# Patient Record
Sex: Female | Born: 1935 | Hispanic: No | Smoking: Former smoker
Health system: Southern US, Community
[De-identification: ages and names within clinical notes are randomized; demographics above are authoritative.]

## PROBLEM LIST (undated history)

## (undated) DIAGNOSIS — N814 Uterovaginal prolapse, unspecified: Secondary | ICD-10-CM

## (undated) DIAGNOSIS — N390 Urinary tract infection, site not specified: Secondary | ICD-10-CM

## (undated) DIAGNOSIS — I10 Essential (primary) hypertension: Secondary | ICD-10-CM

## (undated) DIAGNOSIS — F0390 Unspecified dementia without behavioral disturbance: Secondary | ICD-10-CM

## (undated) DIAGNOSIS — M48061 Spinal stenosis, lumbar region without neurogenic claudication: Secondary | ICD-10-CM

## (undated) DIAGNOSIS — G9340 Encephalopathy, unspecified: Secondary | ICD-10-CM

## (undated) DIAGNOSIS — M119 Crystal arthropathy, unspecified: Secondary | ICD-10-CM

## (undated) HISTORY — PX: NO PAST SURGERIES: SHX2092

---

## 2009-11-23 ENCOUNTER — Ambulatory Visit: Payer: Self-pay | Admitting: Psychology

## 2010-10-13 ENCOUNTER — Emergency Department (HOSPITAL_COMMUNITY)
Admission: EM | Admit: 2010-10-13 | Discharge: 2010-10-13 | Disposition: A | Discharge status: Home / Self Care | Payer: Medicare Other | Attending: Emergency Medicine | Admitting: Emergency Medicine

## 2010-10-13 DIAGNOSIS — R300 Dysuria: Secondary | ICD-10-CM | POA: Insufficient documentation

## 2010-10-13 DIAGNOSIS — M129 Arthropathy, unspecified: Secondary | ICD-10-CM | POA: Insufficient documentation

## 2010-10-13 DIAGNOSIS — N390 Urinary tract infection, site not specified: Secondary | ICD-10-CM | POA: Insufficient documentation

## 2010-10-13 DIAGNOSIS — I10 Essential (primary) hypertension: Secondary | ICD-10-CM | POA: Insufficient documentation

## 2010-10-13 DIAGNOSIS — E119 Type 2 diabetes mellitus without complications: Secondary | ICD-10-CM | POA: Insufficient documentation

## 2010-10-13 LAB — URINALYSIS, ROUTINE W REFLEX MICROSCOPIC
Bilirubin Urine: NEGATIVE
Glucose, UA: >1000 mg/dL — AB
Ketones, ur: 15 mg/dL — AB
Nitrite: POSITIVE — AB
Protein, ur: NEGATIVE mg/dL
Specific Gravity, Urine: 1.015 (ref 1.005–1.030)
Urobilinogen, UA: 0.2 mg/dL (ref 0.0–1.0)
pH: 6.5 (ref 5.0–8.0)

## 2010-10-13 LAB — URINE MICROSCOPIC-ADD ON

## 2010-10-13 LAB — CBC
Hemoglobin: 13.1 g/dL (ref 12.0–15.0)
MCHC: 33 g/dL (ref 30.0–36.0)
RDW: 13.3 % (ref 11.5–15.5)
WBC: 6.4 10*3/uL (ref 4.0–10.5)

## 2010-10-13 LAB — DIFFERENTIAL
Basophils Absolute: 0 10*3/uL (ref 0.0–0.1)
Basophils Relative: 0 % (ref 0–1)
Eosinophils Relative: 0 % (ref 0–5)
Monocytes Absolute: 0.6 10*3/uL (ref 0.1–1.0)
Neutro Abs: 5 10*3/uL (ref 1.7–7.7)

## 2010-10-13 LAB — BASIC METABOLIC PANEL
GFR calc Af Amer: >60 mL/min (ref >60)
GFR calc non Af Amer: >60 mL/min (ref >60)
Potassium: 3.3 mEq/L — ABNORMAL LOW (ref 3.5–5.1)
Sodium: 144 mEq/L (ref 135–145)

## 2010-10-15 LAB — URINE CULTURE: Culture  Setup Time: 201208300157

## 2010-11-03 ENCOUNTER — Emergency Department (HOSPITAL_COMMUNITY)
Admission: EM | Admit: 2010-11-03 | Discharge: 2010-11-03 | Disposition: A | Discharge status: Home / Self Care | Payer: Medicare Other | Attending: Emergency Medicine | Admitting: Emergency Medicine

## 2010-11-03 DIAGNOSIS — N814 Uterovaginal prolapse, unspecified: Secondary | ICD-10-CM | POA: Insufficient documentation

## 2010-11-03 DIAGNOSIS — N390 Urinary tract infection, site not specified: Secondary | ICD-10-CM | POA: Insufficient documentation

## 2010-11-03 DIAGNOSIS — I10 Essential (primary) hypertension: Secondary | ICD-10-CM | POA: Insufficient documentation

## 2010-11-03 DIAGNOSIS — E119 Type 2 diabetes mellitus without complications: Secondary | ICD-10-CM | POA: Insufficient documentation

## 2010-11-08 ENCOUNTER — Observation Stay (HOSPITAL_COMMUNITY)
Admission: AD | Admit: 2010-11-08 | Discharge: 2010-11-08 | Disposition: A | Discharge status: Home / Self Care | Payer: Medicare Other | Source: Ambulatory Visit | Attending: Obstetrics and Gynecology | Admitting: Obstetrics and Gynecology

## 2010-11-08 DIAGNOSIS — F0390 Unspecified dementia without behavioral disturbance: Secondary | ICD-10-CM | POA: Insufficient documentation

## 2010-11-08 DIAGNOSIS — N390 Urinary tract infection, site not specified: Secondary | ICD-10-CM | POA: Diagnosis present

## 2010-11-08 DIAGNOSIS — B9620 Unspecified Escherichia coli [E. coli] as the cause of diseases classified elsewhere: Secondary | ICD-10-CM | POA: Diagnosis present

## 2010-11-08 DIAGNOSIS — N814 Uterovaginal prolapse, unspecified: Secondary | ICD-10-CM | POA: Diagnosis present

## 2010-11-08 DIAGNOSIS — N812 Incomplete uterovaginal prolapse: Principal | ICD-10-CM | POA: Insufficient documentation

## 2010-11-08 NOTE — ED Provider Notes (Signed)
S:  [name] is an 77y.o. Black female who presents with CC of prolapsed uterus.  Her son had been in contact with office several times today and appointment there attempted to be arranged, but her son was unable to get her there before closed.  Pt with dementia, so poor historian and son uncomfortable r/e this aspect of his mother's care; when tried to ask about her history he stated, "Don't you have her records from the office."  Her son reports [name] does indeed live with him, his wife, and their two kids.  She has been having difficulty with the uterine prolapse for about the last 3 days, but has had complicated UTI which has been ongoing for about 3 weeks.  Pt also c/o lower abdominal pain for about 1.5 weeks, and noted light vaginal bleeding on "Depends" pad today.  Significant pain reported with Bowel movements.  Pt seen as new patient at [facility] last week by Dr. Su Hilt and dx'd with prolapse, as well as cystocele and rectocele.  Pessary has been ordered, but has not yet arrived.  Son reports his mother in general cares for herself, with some assistance, but since UTI, has needed more care and attention and "hasn't been herself."  He was out-of-town and she stayed with a cousin in [place] and was taken to hospital r/e UTI.  Son does not know her medications.  Did get brief PMH from her son.  BM record unable to be obtained from son.  Pt usually mobile with assistance of a cane, however, since UTI arisen, has been in [facility] more.    PCP:  Dr. Parke Simmers NKDA  Meds: from office sheet: insulin, benadryl, isosorbide, verapamil, ASA, Cilostazol, sulfamethoxazole, Orphenadrine     Tylenol as needed Brief PMH:  Insulin-dependent diabetes, HTN, high cholesterol, dementia FM HX:  Noncontributory   O:  VS:  157/89, 88, 97.2, 16 PE:  Gen:  NAD, alert; did not ask orientation question; pt cooperative, but speaks minimally.  Appropriate dress; glasses.  Well-nourished. Pelvic:  RN's assisted pt from [facility] to  bed, and son left room; asked if he would be comfortable learning how to replace uterus, and he did not.  As removed fish-net hospital underwear, complete uterine prolapse noted, and most distal surface scabby and excoriated and can see where has bled; very superficial old dried blood on pad.  Explained procedure to pt, and with large amount of lubrication, reinserted uterus as far as pt able to tolerate.  Pt did proclaim with procedure "that's hurting," but as soon as removed hand from vagina, pt calm and without complaints.  Helped pt with undergarments again and assisted to [facility] and pt helped to son's car.  A:  1.  Uterine prolapse       2.  Dementia       3.  77y.o. Black female with several co-morbidities       4.  Uterine irritation causing bleeding from prolapse, friction       5.  Current UTI treatment       6.  ADL's with assistance; mobility decreased with recent infection  P:  1.  Pt d/c'd home after procedure; son given several clean gloves and packets of lubrication and offered to teach his wife over the phone on how to reinsert when happens again while awaiting pessary arrival--he is unsure if she will feel comfortable doing so.      2.  Office to call when pessary arrives, or pt to follow-up  prn      3.  Continue f/u with dr. Parke Simmers r/e other treatments for co-morbidities       4. Continue medications as previously prescribed

## 2010-11-08 NOTE — Progress Notes (Signed)
H STEELMAN CNM AT BEDSIDE  

## 2010-11-08 NOTE — Progress Notes (Signed)
Pt states, " My uterus came out about three days ago. I have some pain in my low abdomen for 1 1/2 weeks."

## 2010-11-13 ENCOUNTER — Other Ambulatory Visit: Payer: Self-pay | Admitting: Obstetrics and Gynecology

## 2010-11-13 ENCOUNTER — Inpatient Hospital Stay (HOSPITAL_COMMUNITY)
Admission: AD | Admit: 2010-11-13 | Discharge: 2010-11-13 | Disposition: A | Discharge status: Home / Self Care | Payer: Medicare Other | Source: Ambulatory Visit | Attending: Obstetrics and Gynecology | Admitting: Obstetrics and Gynecology

## 2010-11-13 DIAGNOSIS — R339 Retention of urine, unspecified: Secondary | ICD-10-CM | POA: Insufficient documentation

## 2010-11-13 LAB — URINALYSIS, ROUTINE W REFLEX MICROSCOPIC
Glucose, UA: 500 mg/dL — AB
Protein, ur: NEGATIVE mg/dL
Specific Gravity, Urine: 1.015 (ref 1.005–1.030)
pH: 5.5 (ref 5.0–8.0)

## 2010-11-13 LAB — URINE MICROSCOPIC-ADD ON

## 2010-11-13 MED ORDER — CIPROFLOXACIN HCL 500 MG PO TABS: 500.0000 mg | ORAL_TABLET | Freq: Two times a day (BID) | ORAL | Status: AC

## 2010-11-13 NOTE — ED Provider Notes (Signed)
Subjective: Patient reports that she is having no problems or pain.  Her son brought her in for eval because her pessary fell out and she has been going to the bathroom every 15 minutes.    Objective: I have reviewed patient's medications.  General: alert and cooperative CV RRR Lungs CTA B ABD nd soft , nt GU total procedentia.  Some errosion at the cervix no bleeding.  Vagina is atrophic.  No vulvar masses seen EXT no calf tenderness B In and out cath done to send UA and pt had 600cc of urine in the bladder  Assessment/Plan: Total Procedentia Urinary retention Check vital signs and UA Pessary cleaned and [place] used for lubrication and pessary replaced Follow up with Dr Su Hilt to discuss long term plan   LOS: 0 days    Kristi Snow A 11/13/2010, 2:29 PM

## 2010-11-13 NOTE — ED Provider Notes (Signed)
Ms. Sida is an 77 yo patient presenting with her son, who reports patient's pessary had fallen out yesterday.  Subsequently, patient has had urinary frequency.  Was recently fitted by Dr. Su Hilt for a pessary due to a complete uterine prolapse--fitting was on Thursday, then fell out on Friday.    Hx remarkable for: Dementia HTN Diabetes, insulin dependent Limited mobility Recent complicated UTI, has completed ATB course early this week.  No current medications.  Medical patient of Dr. Parke Simmers.  Meds: from office sheet: insulin, benadryl, isosorbide, verapamil, ASA, Cilostazol, sulfamethoxazole, Orphenadrine  Tylenol as needed   FM HX: Noncontributory  Filed Vitals:   11/13/10 1429  BP: 150/64  Pulse: 67  Temp: 97.6 F (36.4 C)  Resp: 20   Physican Exam: In NAD.  Does not remember pessary being placed, or any other history or medications. Unable to void on arrival. VSS. Afebrile Complete uterine prolapse noted per visual exam. No active bleeding at present.  UA:  SG 1.015, 500 glucose, 11-20 WBCs Sent to culture  Dr. Normand Sloop in to see patient--also talked with patient's son after examination of patient.  Pessary replaced without difficulty I&O cath done for specimen, and bladder drained of approx 600 cc clear urine.   Assessment/Plan: Complete uterine prolapse, with replacement of pessary Possible persistent UTI--awaiting culture results Per consult with Dr. Normand Sloop, will give RX for Cipro 500 mg po BID x 3 days.  Will suggest initiation be deferred till culture resulted. See Dr. Redmond Baseman note for further information. D/C'd home with son.  Nigel Bridgeman, CNM 11/13/10 1525

## 2010-11-14 LAB — URINE CULTURE
Colony Count: 8000
Culture  Setup Time: 201209291927

## 2011-03-07 ENCOUNTER — Encounter (HOSPITAL_COMMUNITY): Payer: Self-pay | Admitting: *Deleted

## 2011-03-07 ENCOUNTER — Emergency Department (HOSPITAL_COMMUNITY): Payer: Medicare Other

## 2011-03-07 ENCOUNTER — Other Ambulatory Visit: Payer: Self-pay

## 2011-03-07 ENCOUNTER — Inpatient Hospital Stay (HOSPITAL_COMMUNITY)
Admission: EM | Admit: 2011-03-07 | Discharge: 2011-03-10 | DRG: 551 | Disposition: A | Discharge status: Skilled Nursing Facility | Payer: Medicare Other | Attending: Internal Medicine | Admitting: Internal Medicine

## 2011-03-07 DIAGNOSIS — Z79899 Other long term (current) drug therapy: Secondary | ICD-10-CM

## 2011-03-07 DIAGNOSIS — M549 Dorsalgia, unspecified: Secondary | ICD-10-CM | POA: Diagnosis present

## 2011-03-07 DIAGNOSIS — M1990 Unspecified osteoarthritis, unspecified site: Secondary | ICD-10-CM | POA: Diagnosis present

## 2011-03-07 DIAGNOSIS — Z23 Encounter for immunization: Secondary | ICD-10-CM

## 2011-03-07 DIAGNOSIS — F0390 Unspecified dementia without behavioral disturbance: Secondary | ICD-10-CM | POA: Diagnosis present

## 2011-03-07 DIAGNOSIS — M545 Low back pain, unspecified: Secondary | ICD-10-CM | POA: Diagnosis present

## 2011-03-07 DIAGNOSIS — M48061 Spinal stenosis, lumbar region without neurogenic claudication: Secondary | ICD-10-CM | POA: Diagnosis present

## 2011-03-07 DIAGNOSIS — E876 Hypokalemia: Secondary | ICD-10-CM | POA: Diagnosis not present

## 2011-03-07 DIAGNOSIS — N814 Uterovaginal prolapse, unspecified: Secondary | ICD-10-CM | POA: Diagnosis present

## 2011-03-07 DIAGNOSIS — G9349 Other encephalopathy: Secondary | ICD-10-CM | POA: Diagnosis present

## 2011-03-07 DIAGNOSIS — E119 Type 2 diabetes mellitus without complications: Secondary | ICD-10-CM | POA: Diagnosis present

## 2011-03-07 DIAGNOSIS — A498 Other bacterial infections of unspecified site: Secondary | ICD-10-CM | POA: Diagnosis present

## 2011-03-07 DIAGNOSIS — N390 Urinary tract infection, site not specified: Secondary | ICD-10-CM | POA: Diagnosis present

## 2011-03-07 DIAGNOSIS — B9620 Unspecified Escherichia coli [E. coli] as the cause of diseases classified elsewhere: Secondary | ICD-10-CM | POA: Diagnosis present

## 2011-03-07 DIAGNOSIS — G8929 Other chronic pain: Secondary | ICD-10-CM | POA: Diagnosis present

## 2011-03-07 DIAGNOSIS — R5381 Other malaise: Secondary | ICD-10-CM | POA: Diagnosis present

## 2011-03-07 DIAGNOSIS — M4120 Other idiopathic scoliosis, site unspecified: Secondary | ICD-10-CM | POA: Diagnosis present

## 2011-03-07 DIAGNOSIS — Z794 Long term (current) use of insulin: Secondary | ICD-10-CM

## 2011-03-07 DIAGNOSIS — Z6825 Body mass index (BMI) 25.0-25.9, adult: Secondary | ICD-10-CM

## 2011-03-07 DIAGNOSIS — I10 Essential (primary) hypertension: Secondary | ICD-10-CM | POA: Diagnosis present

## 2011-03-07 DIAGNOSIS — M47817 Spondylosis without myelopathy or radiculopathy, lumbosacral region: Principal | ICD-10-CM | POA: Diagnosis present

## 2011-03-07 HISTORY — DX: Essential (primary) hypertension: I10

## 2011-03-07 LAB — URINE MICROSCOPIC-ADD ON

## 2011-03-07 LAB — URINALYSIS, ROUTINE W REFLEX MICROSCOPIC
Glucose, UA: 250 mg/dL — AB
Ketones, ur: NEGATIVE mg/dL
pH: 6 (ref 5.0–8.0)

## 2011-03-07 LAB — BASIC METABOLIC PANEL
CO2: 21 mEq/L (ref 19–32)
Chloride: 106 mEq/L (ref 96–112)
Creatinine, Ser: 0.69 mg/dL (ref 0.50–1.10)

## 2011-03-07 LAB — URINE CULTURE

## 2011-03-07 LAB — GLUCOSE, CAPILLARY
Glucose-Capillary: 203 mg/dL — ABNORMAL HIGH (ref 70–99)
Glucose-Capillary: 224 mg/dL — ABNORMAL HIGH (ref 70–99)

## 2011-03-07 MED ORDER — SODIUM CHLORIDE 0.9 % IV SOLN
INTRAVENOUS | Status: DC
  Administered 2011-03-07: 75 mL/h via INTRAVENOUS
  Administered 2011-03-08 – 2011-03-09 (×2): via INTRAVENOUS

## 2011-03-07 MED ORDER — DONEPEZIL HCL 5 MG PO TABS
5.0000 mg | ORAL_TABLET | Freq: Every day | ORAL | Status: DC
  Administered 2011-03-07 – 2011-03-09 (×3): 5 mg via ORAL
  Filled 2011-03-07 (×4): qty 1

## 2011-03-07 MED ORDER — OXYCODONE HCL 5 MG PO TABS
5.0000 mg | ORAL_TABLET | ORAL | Status: DC | PRN
  Administered 2011-03-08: 5 mg via ORAL
  Filled 2011-03-07: qty 1

## 2011-03-07 MED ORDER — HYDROCODONE-ACETAMINOPHEN 5-325 MG PO TABS
1.0000 | ORAL_TABLET | Freq: Once | ORAL | Status: AC
  Administered 2011-03-07: 1 via ORAL
  Filled 2011-03-07: qty 1

## 2011-03-07 MED ORDER — POTASSIUM CHLORIDE CRYS ER 20 MEQ PO TBCR
20.0000 meq | EXTENDED_RELEASE_TABLET | Freq: Every day | ORAL | Status: DC
  Administered 2011-03-08 – 2011-03-10 (×3): 20 meq via ORAL
  Filled 2011-03-07 (×3): qty 1

## 2011-03-07 MED ORDER — ISOSORBIDE MONONITRATE 20 MG PO TABS
20.0000 mg | ORAL_TABLET | Freq: Two times a day (BID) | ORAL | Status: DC
  Administered 2011-03-08 – 2011-03-10 (×4): 20 mg via ORAL
  Filled 2011-03-07 (×6): qty 1

## 2011-03-07 MED ORDER — DIPHENHYDRAMINE HCL 50 MG PO CAPS
100.0000 mg | ORAL_CAPSULE | Freq: Every day | ORAL | Status: DC
  Administered 2011-03-07 – 2011-03-09 (×3): 100 mg via ORAL
  Filled 2011-03-07 (×4): qty 2

## 2011-03-07 MED ORDER — DEXTROSE 5 % IV SOLN
1.0000 g | INTRAVENOUS | Status: DC
  Administered 2011-03-07 – 2011-03-09 (×3): 1 g via INTRAVENOUS
  Filled 2011-03-07 (×4): qty 10

## 2011-03-07 MED ORDER — INSULIN ASPART 100 UNIT/ML ~~LOC~~ SOLN
0.0000 [IU] | Freq: Three times a day (TID) | SUBCUTANEOUS | Status: DC
  Administered 2011-03-08 – 2011-03-09 (×4): 3 [IU] via SUBCUTANEOUS
  Administered 2011-03-10: 5 [IU] via SUBCUTANEOUS
  Filled 2011-03-07: qty 3

## 2011-03-07 MED ORDER — INSULIN NPH (HUMAN) (ISOPHANE) 100 UNIT/ML ~~LOC~~ SUSP
20.0000 [IU] | Freq: Every day | SUBCUTANEOUS | Status: DC
  Administered 2011-03-08: 20 [IU] via SUBCUTANEOUS
  Filled 2011-03-07 (×2): qty 10

## 2011-03-07 MED ORDER — VERAPAMIL HCL ER 240 MG PO TBCR
240.0000 mg | EXTENDED_RELEASE_TABLET | Freq: Every day | ORAL | Status: DC
Start: 2011-03-07 — End: 2011-03-10
  Administered 2011-03-07 – 2011-03-09 (×3): 240 mg via ORAL
  Filled 2011-03-07 (×4): qty 1

## 2011-03-07 MED ORDER — CILOSTAZOL 100 MG PO TABS
100.0000 mg | ORAL_TABLET | Freq: Two times a day (BID) | ORAL | Status: DC
  Administered 2011-03-07 – 2011-03-10 (×6): 100 mg via ORAL
  Filled 2011-03-07 (×7): qty 1

## 2011-03-07 MED ORDER — ACETAMINOPHEN 325 MG PO TABS
650.0000 mg | ORAL_TABLET | Freq: Every day | ORAL | Status: DC
  Administered 2011-03-08 – 2011-03-10 (×3): 650 mg via ORAL
  Filled 2011-03-07 (×3): qty 2

## 2011-03-07 NOTE — ED Notes (Signed)
Ambulation trial for patient, patient able to get out of bed and take a few steps with assistance x 2.  Patient still with limited ROM in right leg.  Patient placed back in bed with no difficulties.  Family present during ambulatory trial.  Patient's progress reported to MD.

## 2011-03-07 NOTE — ED Notes (Signed)
Patient ordered for ambulation.  With assistance x 2, patient unable to ambulate, patient weak and unable to stand.  MD notified.  Patient placed back in bed.

## 2011-03-07 NOTE — ED Notes (Signed)
MD at bedside at this time.

## 2011-03-07 NOTE — ED Notes (Signed)
Patient c/o lower back pain radiating into right leg.  Patient states this is chronic pain issue resulting from back injury x 20 years ago.  Patient states this morning back with increase in pain. Patient reports no known new injury to back.

## 2011-03-07 NOTE — ED Provider Notes (Cosign Needed)
History     CSN: [id number]  Arrival date & time 03/07/11  0911   First MD Initiated Contact with Patient 03/07/11 0920      Chief Complaint  Patient presents with  . Back Pain    lower back and legs    (Consider location/radiation/quality/duration/timing/severity/associated sxs/prior treatment) HPI Comments: Patient is an elderly lady, 78 years old, living with her son. Today she complained of severe back pain that radiates into the right leg. He hasn't had any recent problem with that. Apparently she had a back injury some 20 years ago. There is no new injury.  Patient is a 78 y.o. female presenting with back pain. The history is provided by the patient and a relative. No language interpreter was used.  Back Pain  This is a recurrent problem. Episode onset: Noted today. The problem occurs constantly. The problem has not changed since onset.Associated with: No recent injury. The pain is present in the lumbar spine. The quality of the pain is described as aching. The pain radiates to the right thigh. The pain is at a severity of 6/10. The symptoms are aggravated by bending and twisting. She has tried nothing for the symptoms. Risk factors include lack of exercise, a sedentary lifestyle and menopause.    Past Medical History  Diagnosis Date  . Hypertension   . Diabetes mellitus   . Back injury     History reviewed. No pertinent past surgical history.  History reviewed. No pertinent family history.  History  Substance Use Topics  . Smoking status: Never Smoker   . Smokeless tobacco: Not on file  . Alcohol Use: No    OB History    Grav Para Term Preterm Abortions TAB SAB Ect Mult Living                  Review of Systems  Constitutional: Negative.   HENT: Negative.   Eyes: Negative.   Respiratory: Negative.   Cardiovascular: Negative.   Gastrointestinal: Negative.   Genitourinary: Negative.   Musculoskeletal: Positive for back pain.  Skin: Negative.     Neurological: Negative.   Psychiatric/Behavioral: Negative.     Allergies  Review of patient's allergies indicates no known allergies.  Home Medications   Current Outpatient Rx  Name Route Sig Dispense Refill  . CILOSTAZOL 100 MG PO TABS Oral Take 100 mg by mouth 2 (two) times daily.      . DONEPEZIL HCL 5 MG PO TABS Oral Take 5 mg by mouth at bedtime as needed.      . INSULIN ISOPHANE HUMAN 100 UNIT/ML [employer] SUSP Subcutaneous Inject 20 Units into the skin daily before breakfast.      . ISOSORBIDE MONONITRATE 20 MG PO TABS Oral Take 20 mg by mouth 2 (two) times daily. Space doses by at least (7) hours.     . ORPHENADRINE CITRATE 100 MG PO TB12 Oral Take 100 mg by mouth 2 (two) times daily.      Marland Kitchen POTASSIUM CHLORIDE CRYS ER 20 MEQ PO TBCR Oral Take 20 mEq by mouth daily.      . TRIAMTERENE-HCTZ 37.5-25 MG PO TABS Oral Take 1 tablet by mouth daily.      Marland Kitchen VERAPAMIL HCL ER 240 MG PO TBCR Oral Take 240 mg by mouth at bedtime.        BP 147/74  Pulse 85  Temp(Src) 97.7 F (36.5 C) (Oral)  Resp 18  SpO2 96%  Physical Exam  Nursing note and vitals reviewed.  Constitutional: She is oriented to person, place, and time. She appears well-developed and well-nourished. Distressed: in moderate distress with back pain, made worse when she changes position.  HENT:  Head: Normocephalic and atraumatic.  Right Ear: External ear normal.  Left Ear: External ear normal.  Mouth/Throat: Oropharynx is clear and moist.  Eyes: Conjunctivae and EOM are normal. Pupils are equal, round, and reactive to light.  Neck: Normal range of motion. Neck supple.  Cardiovascular: Normal rate, regular rhythm and normal heart sounds.   Pulmonary/Chest: Effort normal and breath sounds normal.  Abdominal: Soft. Bowel sounds are normal.  Musculoskeletal:       She localizes pain to the lower lumbar region. There is no palpable deformity there she has mild tenderness in the area of L5-S1.  Neurological: She is alert and  oriented to person, place, and time.       No sensory or motor deficit.  Skin: Skin is warm and dry.  Psychiatric: She has a normal mood and affect. Her behavior is normal.    ED Course  Procedures (including critical care time)  9:34 AM Seen and had physical examination. Hydrocodone acetaminophen ordered for pain. MRI of the lumbar spine ordered.  11:18 AM Pt is in MRI.  2:04 PM Pt's MRI showed severe spinal stenosis and degenerative changes with foraminal encroachment.  I advised her and her son of this.  We attempted to have her walk, but she could not walk, with pain in the right leg.  Will do admitting lab and request admission for pain control, consideration of neurosurgery consultation.  2:38 PM  Date: 03/07/2011  Rate: 82  Rhythm: normal sinus rhythm  QRS Axis: normal  Intervals: normal QRS: low QRS voltage in limb leads.  ST/T Wave abnormalities: normal  Conduction Disutrbances:none  Narrative Interpretation: Essentially normal EKG.  Old EKG Reviewed: none available   1. Low back pain           Carleene Cooper III, MD 03/07/11 1515

## 2011-03-08 LAB — BASIC METABOLIC PANEL
BUN: 10 mg/dL (ref 6–23)
Calcium: 8.7 mg/dL (ref 8.4–10.5)
Creatinine, Ser: 0.71 mg/dL (ref 0.50–1.10)
GFR calc Af Amer: 89 mL/min — ABNORMAL LOW (ref >90)
GFR calc non Af Amer: 77 mL/min — ABNORMAL LOW (ref >90)

## 2011-03-08 LAB — CBC
HCT: 35.4 % — ABNORMAL LOW (ref 36.0–46.0)
MCHC: 32.5 g/dL (ref 30.0–36.0)
Platelets: 222 10*3/uL (ref 150–400)
RDW: 14.4 % (ref 11.5–15.5)
WBC: 3.3 10*3/uL — ABNORMAL LOW (ref 4.0–10.5)

## 2011-03-08 LAB — GLUCOSE, CAPILLARY
Glucose-Capillary: 182 mg/dL — ABNORMAL HIGH (ref 70–99)
Glucose-Capillary: 186 mg/dL — ABNORMAL HIGH (ref 70–99)

## 2011-03-08 LAB — HEMOGLOBIN A1C: Hgb A1c MFr Bld: 8.7 % — ABNORMAL HIGH (ref <5.7)

## 2011-03-08 MED ORDER — LORAZEPAM 0.5 MG PO TABS
0.5000 mg | ORAL_TABLET | Freq: Four times a day (QID) | ORAL | Status: DC | PRN
  Administered 2011-03-08: 0.5 mg via ORAL
  Filled 2011-03-08: qty 1

## 2011-03-08 MED ORDER — INSULIN NPH (HUMAN) (ISOPHANE) 100 UNIT/ML ~~LOC~~ SUSP: 20.0000 [IU] | Freq: Two times a day (BID) | SUBCUTANEOUS | Status: DC

## 2011-03-08 MED ORDER — LORAZEPAM 2 MG/ML IJ SOLN
0.5000 mg | Freq: Once | INTRAMUSCULAR | Status: AC
  Administered 2011-03-08: 0.5 mg via INTRAVENOUS

## 2011-03-08 MED ORDER — LORAZEPAM 2 MG/ML IJ SOLN
INTRAMUSCULAR | Status: AC
  Administered 2011-03-08: 0.5 mg
  Filled 2011-03-08: qty 1

## 2011-03-08 MED ORDER — LORAZEPAM 2 MG/ML IJ SOLN
0.5000 mg | Freq: Four times a day (QID) | INTRAMUSCULAR | Status: DC | PRN
Start: 2011-03-08 — End: 2011-03-10
  Administered 2011-03-09 (×2): 0.5 mg via INTRAVENOUS
  Filled 2011-03-08 (×2): qty 1

## 2011-03-08 MED ORDER — PNEUMOCOCCAL VAC POLYVALENT 25 MCG/0.5ML IJ INJ
0.5000 mL | INJECTION | INTRAMUSCULAR | Status: AC
  Administered 2011-03-09: 0.5 mL via INTRAMUSCULAR
  Filled 2011-03-08: qty 0.5

## 2011-03-08 MED ORDER — INSULIN NPH (HUMAN) (ISOPHANE) 100 UNIT/ML ~~LOC~~ SUSP
10.0000 [IU] | Freq: Every day | SUBCUTANEOUS | Status: DC
  Administered 2011-03-08: 10 [IU] via SUBCUTANEOUS

## 2011-03-08 MED ORDER — INFLUENZA VIRUS VACC SPLIT PF IM SUSP
0.5000 mL | INTRAMUSCULAR | Status: AC
  Administered 2011-03-09: 0.5 mL via INTRAMUSCULAR
  Filled 2011-03-08: qty 0.5

## 2011-03-08 MED ORDER — INSULIN NPH (HUMAN) (ISOPHANE) 100 UNIT/ML ~~LOC~~ SUSP
20.0000 [IU] | Freq: Every day | SUBCUTANEOUS | Status: DC
  Administered 2011-03-09 – 2011-03-10 (×2): 20 [IU] via SUBCUTANEOUS

## 2011-03-08 NOTE — Evaluation (Signed)
Physical Therapy Evaluation Patient Details Name: Kristi Snow MRN: [id number] DOB: Dec 31, 1924 Today's Date: 03/08/2011  Problem List: referred to PT for difficulty walking and unsteady gait Patient Active Problem List  Diagnoses  . Prolapsed uterus  . UTI (lower urinary tract infection)  . Back pain  . Dementia    Past Medical History:  Past Medical History  Diagnosis Date  . Hypertension   . Diabetes mellitus   . Back injury    Past Surgical History: History reviewed. No pertinent past surgical history.  PT Assessment/Plan/Recommendation PT Assessment Clinical Impression Statement: Pt total A for only transfer OOB today and unable to follow commands.  Per nurse, pt was more mobile in evening and agitated, was given ativan and question if this had effect on cognition and mobility during eval today.  Unsure of functional or cognitive baseline as no family present during eval, goals may be revised after this information is obtained. PT Recommendation/Assessment: Patient will need skilled PT in the acute care venue PT Problem List: Decreased activity tolerance;Decreased balance;Decreased mobility;Decreased safety awareness;Decreased cognition Barriers to Discharge Comments: need to detrmine support available PT Therapy Diagnosis : Difficulty walking;Generalized weakness PT Plan PT Frequency: Min 2X/week PT Treatment/Interventions: DME instruction;Gait training;Functional mobility training;Stair training;Therapeutic activities;Therapeutic exercise;Balance training PT Recommendation Follow Up Recommendations: Skilled nursing facility PT Goals  Acute Rehab PT Goals PT Goal Formulation: Patient unable to participate in goal setting Time For Goal Achievement: 2 weeks Pt will go Supine/Side to Sit: with min assist PT Goal: Supine/Side to Sit - Progress: Goal set today Pt will go Sit to Supine/Side: with min assist PT Goal: Sit to Supine/Side - Progress: Goal set today Pt will go Sit  to Stand: with min assist PT Goal: Sit to Stand - Progress: Goal set today Pt will go Stand to Sit: with min assist PT Goal: Stand to Sit - Progress: Goal set today Pt will Stand: with min assist;1 - 2 min;with unilateral upper extremity support PT Goal: Stand - Progress: Goal set today Pt will Ambulate: 16 - 50 feet;with min assist;with least restrictive assistive device;with cues (comment type and amount) PT Goal: Ambulate - Progress: Goal set today  PT Evaluation Precautions/Restrictions  Precautions Precautions: Fall Precaution Comments: dementia, nurse reports combative last night prior to Ativan Prior Functioning  Home Living Lives With: Son Additional Comments: no family present, pt unable to give information   Cognition Cognition Arousal/Alertness:  (closing eyes frequently when in bed) Overall Cognitive Status: History of cognitive impairments History of Cognitive Impairment: Decline in baseline functioning Orientation Level: Disoriented X4 Cognition - Other Comments: pt followed no commands, question if this is baseline or resulting from  Ativan that had been given Sensation/Coordination Sensation Light Touch: Not tested Extremity Assessment RLE Assessment RLE Assessment:  (unable to assess d/t cognition, full P/ROM) LLE Assessment LLE Assessment:  (unable to assess d/t cognition, full P/ROM) Mobility (including Balance) Bed Mobility Bed Mobility: Yes Supine to Sit: 2: Max assist Supine to Sit Details (indicate cue type and reason): initially resisitive Sitting - Scoot to [place] of Bed: 2: Max assist Transfers Transfers: Yes Sit to Stand: 1: +1 Total assist;From bed Sit to Stand Details (indicate cue type and reason): unable to stand upright Stand to Sit: To bed;1: +1 Total assist Squat Pivot Transfers: 1: +1 Total assist (pt = 20%, did weight bear thru LE's) Ambulation/Gait Ambulation/Gait: No Stairs: No  Balance Balance Assessed: Yes Static Sitting  Balance Static Sitting - Level of Assistance: 3: Mod assist (progressed to  close S) Static Standing Balance Static Standing - Level of Assistance: 1: +1 Total assist (unable to get fully upright)   End of Session PT - End of Session Equipment Utilized During Treatment: Gait belt Activity Tolerance:  (question medication effects on mobility today) Nurse Communication: Mobility status for transfers (pt unable to feed self, unable to be left alone in chair) General Behavior During Session: Flat affect Cognition: Impaired  Michaelene Song 03/08/2011, 11:54 AM

## 2011-03-08 NOTE — H&P (Signed)
PCP: Dr. Andi Devon   Chief Complaint: Back pain   HPI: Kristi Snow is an 78 y.o. female with history of dementia, hypertension, diabetes, prior back injury, presents to the emergency room complaining of back pain. She has history of dementia and her chief complaint was rather unreliable. Her son states that she has trouble with walking, and make facial grimaces when she turned on her bed. Evaluation in the emergency room including MRI of her LS-spine showing degenerative joint disease, multilevel facet joint stenosis, lateral recess stenosis, but no fracture. Her urinalysis is positive for urinary tract infection. She was given pain medication, felt a bit better, but still have unsteadiness in her gait. Because of the UTI, the low back pain, and unsteady gait, hospitalist was asked to admit her for further treatment.  Rewiew of Systems: Her dementia is moderate, her review of systems is therefore unreliable.    Past Medical History  Diagnosis Date  . Hypertension   . Diabetes mellitus   . Back injury     History reviewed. No pertinent past surgical history.  Medications:  HOME MEDS: Prior to Admission medications   Medication Sig Start Date End Date Taking? Authorizing Provider  acetaminophen (TYLENOL) 325 MG tablet Take 650 mg by mouth daily.   Yes Historical Provider, MD  cilostazol (PLETAL) 100 MG tablet Take 100 mg by mouth 2 (two) times daily.     Yes Historical Provider, MD  diphenhydrAMINE (BENADRYL) 50 MG capsule Take 100 mg by mouth at bedtime.   Yes Historical Provider, MD  donepezil (ARICEPT) 5 MG tablet Take 5 mg by mouth daily.    Yes Historical Provider, MD  insulin NPH (HUMULIN N,NOVOLIN N) 100 UNIT/ML injection Inject 20 Units into the skin daily before breakfast.     Yes Historical Provider, MD  isosorbide mononitrate (ISMO,MONOKET) 20 MG tablet Take 20 mg by mouth 2 (two) times daily. Space doses by at least (7) hours.    Yes Historical Provider, MD    orphenadrine (NORFLEX) 100 MG tablet Take 100 mg by mouth 2 (two) times daily. With tylenol   Yes Historical Provider, MD  potassium chloride SA (K-DUR,KLOR-CON) 20 MEQ tablet Take 20 mEq by mouth daily.     Yes Historical Provider, MD  triamterene-hydrochlorothiazide (MAXZIDE-25) 37.5-25 MG per tablet Take 1 tablet by mouth daily.     Yes Historical Provider, MD  verapamil (CALAN-SR) 240 MG CR tablet Take 240 mg by mouth at bedtime.     Yes Historical Provider, MD     Allergies:  No Known Allergies  Social History:   reports that she has never smoked. She does not have any smokeless tobacco history on file. She reports that she does not drink alcohol or use illicit drugs.  Family History: History reviewed. No pertinent family history.   Physical Exam: Filed Vitals:   03/07/11 0912 03/07/11 1321 03/07/11 1840 03/07/11 2100  BP: 147/74 158/79 151/76 156/69  Pulse: 85 79 82 76  Temp: 97.7 F (36.5 C) 96.6 F (35.9 C)  97 F (36.1 C)  TempSrc: Oral Oral  Oral  Resp: 18 18 18 20   SpO2: 96% 96% 98% 96%   Blood pressure 156/69, pulse 76, temperature 97 F (36.1 C), temperature source Oral, resp. rate 20, SpO2 96.00%.  GEN:  Pleasant person lying in the stretcher in no acute distress; cooperative with exam. She confabulates quite a bit PSYCH: She does not know the president's name, the year, the current place. She does not appear  anxious does not appear depressed; affect is normal HEENT: Mucous membranes pink and anicteric; PERRLA; EOM intact; no cervical lymphadenopathy nor thyromegaly or carotid bruit; no JVD; Breasts:: Not examined CHEST WALL: No tenderness CHEST: Normal respiration, clear to auscultation bilaterally HEART: Regular rate and rhythm; no murmurs rubs or gallops BACK:  no CVA tenderness ABDOMEN: Obese, soft non-tender; no masses, no organomegaly, normal abdominal bowel sounds; no pannus; no intertriginous candida. Rectal Exam: Not done EXTREMITIES: No bone or  joint deformity; age-appropriate arthropathy of the hands and knees; no edema; no ulcerations. Genitalia: not examined PULSES: 2+ and symmetric SKIN: Normal hydration no rash or ulceration CNS: Cranial nerves 2-12 grossly intact no focal neurologic deficit   Labs & Imaging Results for orders placed during the hospital encounter of 03/07/11 (from the past 48 hour(s))  BASIC METABOLIC PANEL     Status: Abnormal   Collection Time   03/07/11  2:45 PM      Component Value Range Comment   Sodium 136  135 - 145 (mEq/L)    Potassium 4.9  3.5 - 5.1 (mEq/L) HEMOLYSIS AT THIS LEVEL MAY AFFECT RESULT   Chloride 106  96 - 112 (mEq/L)    CO2 21  19 - 32 (mEq/L)    Glucose, Bld 261 (*) 70 - 99 (mg/dL)    BUN 14  6 - 23 (mg/dL)    Creatinine, Ser 4.09  0.50 - 1.10 (mg/dL)    Calcium 9.3  8.4 - 10.5 (mg/dL)    GFR calc non Af Amer 78 (*) >90 (mL/min)    GFR calc Af Amer >90  >90 (mL/min)   URINALYSIS, ROUTINE W REFLEX MICROSCOPIC     Status: Abnormal   Collection Time   03/07/11  3:33 PM      Component Value Range Comment   Color, Urine YELLOW  YELLOW     APPearance CLOUDY (*) CLEAR     Specific Gravity, Urine 1.013  1.005 - 1.030     pH 6.0  5.0 - 8.0     Glucose, UA 250 (*) NEGATIVE (mg/dL)    Hgb urine dipstick TRACE (*) NEGATIVE     Bilirubin Urine NEGATIVE  NEGATIVE     Ketones, ur NEGATIVE  NEGATIVE (mg/dL)    Protein, ur NEGATIVE  NEGATIVE (mg/dL)    Urobilinogen, UA 0.2  0.0 - 1.0 (mg/dL)    Nitrite POSITIVE (*) NEGATIVE     Leukocytes, UA TRACE (*) NEGATIVE    URINE MICROSCOPIC-ADD ON     Status: Abnormal   Collection Time   03/07/11  3:33 PM      Component Value Range Comment   Squamous Epithelial / LPF MANY (*) RARE     WBC, UA 7-10  <3 (WBC/hpf)    RBC / HPF 0-2  <3 (RBC/hpf)    Bacteria, UA MANY (*) RARE     Urine-Other AMORPHOUS URATES/PHOSPHATES     GLUCOSE, CAPILLARY     Status: Abnormal   Collection Time   03/07/11  5:55 PM      Component Value Range Comment    Glucose-Capillary 203 (*) 70 - 99 (mg/dL)   GLUCOSE, CAPILLARY     Status: Abnormal   Collection Time   03/07/11 10:59 PM      Component Value Range Comment   Glucose-Capillary 224 (*) 70 - 99 (mg/dL)    Mr Lumbar Spine Wo Contrast  03/07/2011  *RADIOLOGY REPORT*  Clinical Data: Larey Seat.  Low back pain.  MRI LUMBAR SPINE WITHOUT  CONTRAST  Technique:  Multiplanar and multiecho pulse sequences of the lumbar spine were obtained without intravenous contrast.  Comparison: None  Findings: 49 degrees right convex lumbar scoliosis with associated severe disc disease and facet disease.  No acute lumbar compression fracture.  The last full intervertebral disc space is labeled L5-S1 and the conus medullaris terminates at the bottom of L1.  The facets are normally aligned.  No definite pars defects.  Severe facet disease is noted.  No significant paraspinal or retroperitoneal process is identified.  Moderate distention of the bladder is noted.  A  Axial images are somewhat limited by patient motion.  L1-2: Left lateral recess and foraminal stenosis due to spurring changes and facet disease.  No significant spinal stenosis.  L2-3:  Severe disc disease with left lateral recess and left foraminal stenosis.  L3-4:  Diffuse bulging annulus, short pedicles, severe facet disease and ligamentum flavum thickening contribute to moderately severe spinal and bilateral lateral recess stenosis.  Is also mild foraminal stenosis bilaterally.  L4-5:  Severe right-sided facet disease with marked buckling of the ligamentum flavum thickening and possible complex synovial cyst contributing to moderate spinal and bilateral lateral recess stenosis in conjunction with short pedicles and a bulging annulus. A there is also right foraminal stenosis.  L5-S1:  No significant spinal stenosis.  There is foraminal stenosis bilaterally, right greater than left.  IMPRESSION:  1.  Severe scoliosis, lumbar spondylosis and facet disease. 2.  No acute fracture.  3.  Multilevel multifactorial spinal, lateral recess and foraminal stenosis. 4.  Moderate distention of the bladder.  Original Report Authenticated By: P. Loralie Champagne, M.D.      Assessment Present on Admission:  .Prolapsed uterus .UTI (lower urinary tract infection) .Dementia   PLAN: She does have history of low back pain and is a little bit worse today. Will go and admit her to the hospital for observation. Will consult physical therapy to evaluate the stability of her gait. For her UTI, we'll treat her with intravenous Rocephin. She is stable, full code, and will be admitted to [facility].  Other plans as per orders.    Kristi Snow 03/08/2011, 12:36 AM

## 2011-03-08 NOTE — Progress Notes (Signed)
Kristi Snow  ZOX:[id number]  DOB: 1924-11-28  DOA: 03/07/2011  PCP: No primary provider on file.  Subjective: Sitting in the chair, appears to be somewhat confused (has a history of dementia)  Objective: Weight change:   Intake/Output Summary (Last 24 hours) at 03/08/11 1354 Last data filed at 03/08/11 1300  Gross per 24 hour  Intake    400 ml  Output    550 ml  Net   -150 ml   Blood pressure 148/92, pulse 85, temperature 98.3 F (36.8 C), temperature source Oral, resp. rate 20, height 5\' 5"  (1.651 m), weight 68.2 kg (150 lb 5.7 oz), SpO2 98.00%.  Physical Exam: General: Alert and awake, not in any acute distress. HEENT: anicteric sclera, pupils reactive to light and accommodation, EOMI CVS: S1-S2 clear, no murmur rubs or gallops Chest: clear to auscultation bilaterally, no wheezing, rales or rhonchi Abdomen: soft nontender, nondistended, normal bowel sounds, no organomegaly Extremities: no cyanosis, clubbing or edema noted bilaterally Neuro: Cranial nerves II-XII intact, no focal neurological deficits  Lab Results: Basic Metabolic Panel:  Lab 03/08/11 8119 03/07/11 1445  NA 140 136  K 3.5 4.9  CL 107 106  CO2 26 21  GLUCOSE 238* 261*  BUN 10 14  CREATININE 0.71 0.69  CALCIUM 8.7 9.3  MG -- --  PHOS -- --  CBC:  Lab 03/08/11 0655  WBC 3.3*  NEUTROABS --  HGB 11.5*  HCT 35.4*  MCV 89.6  PLT 222   CBG:  Lab 03/08/11 1324 03/08/11 0755 03/07/11 2259 03/07/11 1755  GLUCAP 186* 182* 224* 203*     Micro Results: No results found for this or any previous visit (from the past 240 hour(s)).  Studies/Results: Mr Lumbar Spine Wo Contrast  03/07/2011  *RADIOLOGY REPORT*  Clinical Data: Larey Seat.  Low back pain.  MRI LUMBAR SPINE WITHOUT CONTRAST  Technique:  Multiplanar and multiecho pulse sequences of the lumbar spine were obtained without intravenous contrast.  Comparison: None  Findings: 49 degrees right convex lumbar scoliosis with associated severe disc disease  and facet disease.  No acute lumbar compression fracture.  The last full intervertebral disc space is labeled L5-S1 and the conus medullaris terminates at the bottom of L1.  The facets are normally aligned.  No definite pars defects.  Severe facet disease is noted.  No significant paraspinal or retroperitoneal process is identified.  Moderate distention of the bladder is noted.  A  Axial images are somewhat limited by patient motion.  L1-2: Left lateral recess and foraminal stenosis due to spurring changes and facet disease.  No significant spinal stenosis.  L2-3:  Severe disc disease with left lateral recess and left foraminal stenosis.  L3-4:  Diffuse bulging annulus, short pedicles, severe facet disease and ligamentum flavum thickening contribute to moderately severe spinal and bilateral lateral recess stenosis.  Is also mild foraminal stenosis bilaterally.  L4-5:  Severe right-sided facet disease with marked buckling of the ligamentum flavum thickening and possible complex synovial cyst contributing to moderate spinal and bilateral lateral recess stenosis in conjunction with short pedicles and a bulging annulus. A there is also right foraminal stenosis.  L5-S1:  No significant spinal stenosis.  There is foraminal stenosis bilaterally, right greater than left.  IMPRESSION:  1.  Severe scoliosis, lumbar spondylosis and facet disease. 2.  No acute fracture. 3.  Multilevel multifactorial spinal, lateral recess and foraminal stenosis. 4.  Moderate distention of the bladder.  Original Report Authenticated By: P. Loralie Champagne, M.D.    Medications:  Scheduled Meds:   . acetaminophen  650 mg Oral Daily  . cefTRIAXone (ROCEPHIN)  IV  1 g Intravenous Q24H  . cilostazol  100 mg Oral BID  . diphenhydrAMINE  100 mg Oral QHS  . donepezil  5 mg Oral QHS  . HYDROcodone-acetaminophen  1 tablet Oral Once  . influenza  inactive virus vaccine  0.5 mL Intramuscular Tomorrow-1000  . insulin aspart  0-15 Units  Subcutaneous TID WC  . insulin NPH  20 Units Subcutaneous QAC breakfast  . isosorbide mononitrate  20 mg Oral BID WC  . LORazepam      . LORazepam  0.5 mg Intravenous Once  . pneumococcal 23 valent vaccine  0.5 mL Intramuscular Tomorrow-1000  . potassium chloride SA  20 mEq Oral Daily  . verapamil  240 mg Oral QHS   Continuous Infusions:   . sodium chloride 75 mL/hr at 03/08/11 1003     Assessment/Plan: Active Problems:    UTI (lower urinary tract infection): - Follow cultures, and continue IV Rocephin   Back pain - Continue PRN pain medications, physical therapy evaluation   Acute encephalopathy on Dementia: Possibly precipitated due to UTI, continue treating with IV antibiotics, continue Aricept  Diabetes mellitus, insulin-dependent: Uncontrolled, HbA1c 8.7 - Continue sliding scale insulin, add NPH to 10units qhs, continue SSI and continue 20units qam  DVT Prophylaxis: SCD  Code Status:full code  Disposition: PT eval   LOS: 1 day   Fatmata Legere M.D. [facility] 03/08/2011, 1:54 PM

## 2011-03-08 NOTE — Progress Notes (Signed)
Inpatient Diabetes Program Recommendations  AACE/ADA: New Consensus Statement on Inpatient Glycemic Control (2009)  Target Ranges:  Prepandial:   less than 140 mg/dL      Peak postprandial:   less than 180 mg/dL (1-2 hours)      Critically ill patients:  140 - 180 mg/dL   Reason for Visit: Elevated HgBA1C at 8.7 %  Inpatient Diabetes Program Recommendations Insulin - Basal: Please consider addition of 10 units NPH at HS HgbA1C: High at 8.7 %  Would consider addition of HS NPH as well as am.  Fasting cbg high. Diet: Please change to carbohydrate modified - medium.  (as this includes heart healthy; however, heart healthy does not include carb modified).  Thank you, Lenor Coffin, RN, CNS, Diabetes Coordinator [phone])  Note:

## 2011-03-09 LAB — GLUCOSE, CAPILLARY
Glucose-Capillary: 81 mg/dL (ref 70–99)
Glucose-Capillary: 55 mg/dL — ABNORMAL LOW (ref 70–99)
Glucose-Capillary: 87 mg/dL (ref 70–99)
Glucose-Capillary: 115 mg/dL — ABNORMAL HIGH (ref 70–99)

## 2011-03-09 NOTE — Progress Notes (Addendum)
Kristi Snow  ZOX:[id number]  DOB: 01-Sep-1924  DOA: 03/07/2011  PCP: No primary provider on file.  Subjective: Resting in bed, no complaints, confused (has a history of dementia)  Objective: Weight change: 0.4 kg (14.1 oz)  Intake/Output Summary (Last 24 hours) at 03/09/11 1051 Last data filed at 03/09/11 1003  Gross per 24 hour  Intake   1073 ml  Output    300 ml  Net    773 ml   Blood pressure 133/63, pulse 68, temperature 97.2 F (36.2 C), temperature source Oral, resp. rate 20, height 5\' 5"  (1.651 m), weight 68.6 kg (151 lb 3.8 oz), SpO2 100.00%.  Physical Exam: General: Alert and awake, not in any acute distress. HEENT: anicteric sclera, pupils reactive to light and accommodation, EOMI CVS: S1-S2 clear, no murmur rubs or gallops Chest: clear to auscultation bilaterally, no wheezing, rales or rhonchi Abdomen: soft nontender, nondistended, normal bowel sounds, no organomegaly Extremities: no cyanosis, clubbing or edema noted bilaterally   Lab Results: Basic Metabolic Panel:  Lab 03/08/11 8119 03/07/11 1445  NA 140 136  K 3.5 4.9  CL 107 106  CO2 26 21  GLUCOSE 238* 261*  BUN 10 14  CREATININE 0.71 0.69  CALCIUM 8.7 9.3  MG -- --  PHOS -- --  CBC:  Lab 03/08/11 0655  WBC 3.3*  NEUTROABS --  HGB 11.5*  HCT 35.4*  MCV 89.6  PLT 222   CBG:  Lab 03/09/11 0839 03/08/11 2153 03/08/11 1703 03/08/11 1324 03/08/11 0755  GLUCAP 81 201* 171* 186* 182*     Micro Results: Recent Results (from the past 240 hour(s))  URINE CULTURE     Status: Normal   Collection Time   03/07/11  3:33 PM      Component Value Range Status Comment   Specimen Description URINE, CLEAN CATCH   Final    Special Requests NONE   Final    Setup Time [id number]   Final    Colony Count >=100,000 COLONIES/ML   Final    Culture ESCHERICHIA COLI   Final    Report Status 03/09/2011 FINAL   Final    Organism ID, Bacteria ESCHERICHIA COLI   Final     Studies/Results: Mr Lumbar Spine Wo  Contrast  03/07/2011  *RADIOLOGY REPORT*  Clinical Data: Larey Seat.  Low back pain.  MRI LUMBAR SPINE WITHOUT CONTRAST  Technique:  Multiplanar and multiecho pulse sequences of the lumbar spine were obtained without intravenous contrast.  Comparison: None  Findings: 49 degrees right convex lumbar scoliosis with associated severe disc disease and facet disease.  No acute lumbar compression fracture.  The last full intervertebral disc space is labeled L5-S1 and the conus medullaris terminates at the bottom of L1.  The facets are normally aligned.  No definite pars defects.  Severe facet disease is noted.  No significant paraspinal or retroperitoneal process is identified.  Moderate distention of the bladder is noted.  A  Axial images are somewhat limited by patient motion.  L1-2: Left lateral recess and foraminal stenosis due to spurring changes and facet disease.  No significant spinal stenosis.  L2-3:  Severe disc disease with left lateral recess and left foraminal stenosis.  L3-4:  Diffuse bulging annulus, short pedicles, severe facet disease and ligamentum flavum thickening contribute to moderately severe spinal and bilateral lateral recess stenosis.  Is also mild foraminal stenosis bilaterally.  L4-5:  Severe right-sided facet disease with marked buckling of the ligamentum flavum thickening and possible complex synovial cyst contributing  to moderate spinal and bilateral lateral recess stenosis in conjunction with short pedicles and a bulging annulus. A there is also right foraminal stenosis.  L5-S1:  No significant spinal stenosis.  There is foraminal stenosis bilaterally, right greater than left.  IMPRESSION:  1.  Severe scoliosis, lumbar spondylosis and facet disease. 2.  No acute fracture. 3.  Multilevel multifactorial spinal, lateral recess and foraminal stenosis. 4.  Moderate distention of the bladder.  Original Report Authenticated By: P. Loralie Champagne, M.D.    Medications: Scheduled Meds:    .  acetaminophen  650 mg Oral Daily  . cefTRIAXone (ROCEPHIN)  IV  1 g Intravenous Q24H  . cilostazol  100 mg Oral BID  . diphenhydrAMINE  100 mg Oral QHS  . donepezil  5 mg Oral QHS  . influenza  inactive virus vaccine  0.5 mL Intramuscular Tomorrow-1000  . insulin aspart  0-15 Units Subcutaneous TID WC  . insulin NPH  10 Units Subcutaneous QHS  . insulin NPH  20 Units Subcutaneous QAC breakfast  . isosorbide mononitrate  20 mg Oral BID WC  . pneumococcal 23 valent vaccine  0.5 mL Intramuscular Tomorrow-1000  . potassium chloride SA  20 mEq Oral Daily  . verapamil  240 mg Oral QHS  . DISCONTD: insulin NPH  20 Units Subcutaneous QAC breakfast  . DISCONTD: insulin NPH  20 Units Subcutaneous BID AC   Continuous Infusions:    . sodium chloride 75 mL/hr at 03/08/11 1003     Assessment/Plan: Active Problems:    UTI (lower urinary tract infection): Urine cultures positive for Escherichia coli -  continue IV Rocephin (day 2)   Back pain - Continue PRN pain medications, physical therapy evaluation recommended SNF   Acute encephalopathy on Dementia: Possibly precipitated due to UTI, continue treating with IV antibiotics, continue Aricept  Diabetes mellitus, insulin-dependent: HbA1c 8.7 - Continue sliding scale insulin, add NPH to 10units qhs, continue SSI and continue 20units qam, blood sugar is better controlled today  DVT Prophylaxis: SCD  Code Status:full code  Disposition: PT eval recommending skilled nursing facility.    LOS: 2 days   RAI,RIPUDEEP M.D. [facility] 03/09/2011, 10:51 AM

## 2011-03-09 NOTE — Progress Notes (Signed)
Clinical social worker received referral for potential skilled nursing placement. CSW attempted to assess patient however due to patient dementia CSW unable to assess patient. CSW left message for patient son who also requested assistance with snf placement. .Clinical social worker continuing to follow pt to assist with pt dc plans and further csw needs.   Catha Gosselin, Theresia Majors  [phone] .03/09/2011 14:55pm

## 2011-03-10 DIAGNOSIS — M48061 Spinal stenosis, lumbar region without neurogenic claudication: Secondary | ICD-10-CM | POA: Diagnosis present

## 2011-03-10 LAB — GLUCOSE, CAPILLARY: Glucose-Capillary: 83 mg/dL (ref 70–99)

## 2011-03-10 MED ORDER — OXYCODONE HCL 5 MG PO TABS: 5.0000 mg | ORAL_TABLET | Freq: Three times a day (TID) | ORAL | Status: AC | PRN

## 2011-03-10 MED ORDER — CEPHALEXIN 500 MG PO CAPS: 500.0000 mg | ORAL_CAPSULE | Freq: Two times a day (BID) | ORAL | Status: AC

## 2011-03-10 MED ORDER — LORAZEPAM 0.5 MG PO TABS: 0.5000 mg | ORAL_TABLET | Freq: Three times a day (TID) | ORAL | Status: AC | PRN

## 2011-03-10 MED ORDER — ZOLPIDEM TARTRATE 5 MG PO TABS: 5.0000 mg | ORAL_TABLET | Freq: Every day | ORAL | Status: DC

## 2011-03-10 MED ORDER — TUBERCULIN PPD 5 UNIT/0.1ML ID SOLN
5.0000 [IU] | Freq: Once | INTRADERMAL | Status: AC
  Administered 2011-03-10: 5 [IU] via INTRADERMAL
  Filled 2011-03-10: qty 0.1

## 2011-03-10 NOTE — Discharge Summary (Signed)
Physician Discharge Summary  Patient ID: Kristi Snow MRN: [id number] DOB/AGE: 76-Sep-1926 76 y.o.  Admit date: 03/07/2011 Discharge date: 03/10/2011  Primary Care Physician: Andi Devon, M.D.  Discharge Diagnoses:     .Prolapsed uterus .UTI (lower urinary tract infection), urine culture positive for Escherichia coli  . acute on chronic Back pain .Dementia .Lumbar spinal stenosis with generalized debility Acute encephalopathy superimposed on dementia, improved Hypokalemia Diabetes mellitus, insulin-dependent  Consults:  None   Discharge Medications: Medication List  As of 03/10/2011  2:26 PM   STOP taking these medications         diphenhydrAMINE 50 MG capsule      triamterene-hydrochlorothiazide 37.5-25 MG per tablet         TAKE these medications         acetaminophen 325 MG tablet   Commonly known as: TYLENOL   Take 650 mg by mouth daily.      cephALEXin 500 MG capsule   Commonly known as: KEFLEX   Take 1 capsule (500 mg total) by mouth 2 (two) times daily.      cilostazol 100 MG tablet   Commonly known as: PLETAL   Take 100 mg by mouth 2 (two) times daily.      donepezil 5 MG tablet   Commonly known as: ARICEPT   Take 5 mg by mouth daily.      insulin NPH 100 UNIT/ML injection   Commonly known as: HUMULIN N,NOVOLIN N   Inject 20 Units into the skin daily before breakfast.      isosorbide mononitrate 20 MG tablet   Commonly known as: ISMO,MONOKET   Take 20 mg by mouth 2 (two) times daily. Space doses by at least (7) hours.      LORazepam 0.5 MG tablet   Commonly known as: ATIVAN   Take 1 tablet (0.5 mg total) by mouth every 8 (eight) hours as needed for anxiety.      orphenadrine 100 MG tablet   Commonly known as: NORFLEX   Take 100 mg by mouth 2 (two) times daily. With tylenol      oxyCODONE 5 MG immediate release tablet   Commonly known as: Oxy IR/ROXICODONE   Take 1 tablet (5 mg total) by mouth every 8 (eight) hours as needed for pain.     potassium chloride SA 20 MEQ tablet   Commonly known as: K-DUR,KLOR-CON   Take 20 mEq by mouth daily.      verapamil 240 MG CR tablet   Commonly known as: CALAN-SR   Take 240 mg by mouth at bedtime.      zolpidem 5 MG tablet   Commonly known as: AMBIEN   Take 1 tablet (5 mg total) by mouth at bedtime.             Brief H and P: For complete details please refer to admission H and P, but in brief Kristi Snow is an 78 y.o. female with history of dementia, hypertension, diabetes, prior back injury, presented to the emergency room complaining of back pain. She has history of dementia and her chief complaint was rather unreliable. Her son states that she has trouble with walking, and make facial grimaces when she turned on her bed. Evaluation in the emergency room including MRI of her LS-spine showing degenerative joint disease, multilevel facet joint stenosis, lateral recess stenosis, but no fracture. Her urinalysis was positive for urinary tract infection. She was given pain medication, felt a bit better, but still have unsteadiness in her  gait. Because of the UTI, the low back pain, and unsteady gait, patient was admitted for further workup.  Hospital Course:  UTI (lower urinary tract infection): The patient did have significant encephalopathy, episodes of agitation likely secondary to UTI superimposed on dementia. Urine cultures were positive for Escherichia coli. Patient was placed on IV Rocephin and which did improve her symptoms significantly. She'll continue by mouth Keflex.   Back pain: Acute on chronic likely secondary to lumbar spinal stenosis. MRI was done which showed severe scoliosis, lumbar spondylosis and facet disease with no acute fracture, multilevel spinal, lateral recess and foraminal stenosis.  Patient will continue when necessary pain medications with the physical, occupational therapy at the facility. Due to back pain and generalized debility, falls precautions are  recommended.  Acute encephalopathy on Dementia: Possibly precipitated due to UTI,  continue Aricept   Diabetes mellitus, insulin-dependent: HbA1c 8.7, patient was placed on sliding scale insulin, with NPH insulin inpatient. She will resume her outpatient insulin schedule after discharge.  Day of Discharge BP 154/77  Pulse 82  Temp(Src) 96.2 F (35.7 C) (Axillary)  Resp 20  Ht 5\' 5"  (1.651 m)  Wt 68.6 kg (151 lb 3.8 oz)  BMI 25.17 kg/m2  SpO2 92%  Physical Exam: General: Alert and awake oriented not in any acute distress. HEENT: anicteric sclera, pupils reactive to light and accommodation CVS: S1-S2 clear no murmur rubs or gallops Chest: clear to auscultation bilaterally, no wheezing rales or rhonchi Abdomen: soft nontender, nondistended, normal bowel sounds, no organomegaly Extremities: no cyanosis, clubbing or edema noted bilaterally Neuro: Cranial nerves II-XII intact, no focal neurological deficits   The results of significant diagnostics from this hospitalization (including imaging, microbiology, ancillary and laboratory) are listed below for reference.    LAB RESULTS: Basic Metabolic Panel:  Lab 03/08/11 9604 03/07/11 1445  NA 140 136  K 3.5 4.9  CL 107 106  CO2 26 21  GLUCOSE 238* 261*  BUN 10 14  CREATININE 0.71 0.69  CALCIUM 8.7 9.3  MG -- --  PHOS -- --    CBC:  Lab 03/08/11 0655  WBC 3.3*  NEUTROABS --  HGB 11.5*  HCT 35.4*  MCV 89.6  PLT 222   CBG:  Lab 03/10/11 1223 03/10/11 0749  GLUCAP 202* 83    Significant Diagnostic Studies:  Mr Lumbar Spine Wo Contrast  03/07/2011  *RADIOLOGY REPORT*  Clinical Data: Larey Seat.  Low back pain.  MRI LUMBAR SPINE WITHOUT CONTRAST  Technique:  Multiplanar and multiecho pulse sequences of the lumbar spine were obtained without intravenous contrast.  Comparison: None  Findings: 49 degrees right convex lumbar scoliosis with associated severe disc disease and facet disease.  No acute lumbar compression fracture.   The last full intervertebral disc space is labeled L5-S1 and the conus medullaris terminates at the bottom of L1.  The facets are normally aligned.  No definite pars defects.  Severe facet disease is noted.  No significant paraspinal or retroperitoneal process is identified.  Moderate distention of the bladder is noted.  A  Axial images are somewhat limited by patient motion.  L1-2: Left lateral recess and foraminal stenosis due to spurring changes and facet disease.  No significant spinal stenosis.  L2-3:  Severe disc disease with left lateral recess and left foraminal stenosis.  L3-4:  Diffuse bulging annulus, short pedicles, severe facet disease and ligamentum flavum thickening contribute to moderately severe spinal and bilateral lateral recess stenosis.  Is also mild foraminal stenosis bilaterally.  L4-5:  Severe right-sided facet disease with marked buckling of the ligamentum flavum thickening and possible complex synovial cyst contributing to moderate spinal and bilateral lateral recess stenosis in conjunction with short pedicles and a bulging annulus. A there is also right foraminal stenosis.  L5-S1:  No significant spinal stenosis.  There is foraminal stenosis bilaterally, right greater than left.  IMPRESSION:  1.  Severe scoliosis, lumbar spondylosis and facet disease. 2.  No acute fracture. 3.  Multilevel multifactorial spinal, lateral recess and foraminal stenosis. 4.  Moderate distention of the bladder.  Original Report Authenticated By: P. Loralie Champagne, M.D.     Disposition and Follow-up: Discharge Orders    Future Orders Please Complete By Expires   Diet - low sodium heart healthy, diabetic diet      Increase activity slowly      Discharge instructions      Comments:   Please call MD if Blood sugars >300 for insulin dose adjustment.    Please check CBG before administering the insulin.   Continue PT,OT, speech and nursing.    Fall precautions       DISPOSITION: Assisted-living  facility with home PT, OT, speech, and RN followup  DIET: Diabetic diet  ACTIVITY: As tolerated   DISCHARGE FOLLOW-UP Follow-up Information    Follow up with Alva Garnet., MD. Schedule an appointment as soon as possible for a visit in 2 weeks.   Contact information:   [address] [place] [address] [phone]          Time spent on Discharge: 45 mins  Signed:  RAI,RIPUDEEP M.D. [facility] 03/10/2011, 2:26 PM

## 2011-03-10 NOTE — Progress Notes (Signed)
Physical Therapy Treatment Patient Details Name: Kristi Snow MRN: [id number] DOB: Sep 02, 1924 Today's Date: 03/10/2011  PT Assessment/Plan  PT - Assessment/Plan Comments on Treatment Session: Pt adm with UTI.  Pt with much improved mobility today. PT Plan: Discharge plan remains appropriate PT Frequency: Min 2X/week Follow Up Recommendations: Skilled nursing facility Equipment Recommended: Defer to next venue PT Goals  Acute Rehab PT Goals PT Goal: Supine/Side to Sit - Progress: Progressing toward goal PT Goal: Sit to Supine/Side - Progress: Progressing toward goal PT Goal: Sit to Stand - Progress: Progressing toward goal Pt will go Stand to Sit: with supervision PT Goal: Stand to Sit - Progress: Updated due to goals met PT Goal: Stand - Progress: Progressing toward goal Pt will Ambulate: >150 feet;with supervision;with least restrictive assistive device PT Goal: Ambulate - Progress: Updated due to goal met  PT Treatment Precautions/Restrictions  Precautions Precautions: Fall Precaution Comments: dementia, nurse reports combative last night prior to Ativan Mobility (including Balance) Transfers Sit to Stand: 3: Mod assist;With upper extremity assist;From chair/3-in-1;With armrests Sit to Stand Details (indicate cue type and reason): verbal/tactile cues to lean anteriorly Stand to Sit: 4: Min assist;With upper extremity assist;With armrests;To chair/3-in-1 Stand to Sit Details: assist to control descent Ambulation/Gait Ambulation/Gait Assistance: 4: Min assist (+1 for IV) Ambulation/Gait Assistance Details (indicate cue type and reason): Frequent verbal/ tactile cues to stand more erect and to stay closer to walker Ambulation Distance (Feet): 175 Feet Assistive device: Rolling walker Gait Pattern: Trunk flexed;Decreased step length - right;Decreased step length - left  Static Standing Balance Static Standing - Level of Assistance: 4: Min assist (cues to stand erect) Exercise    End of Session PT - End of Session Equipment Utilized During Treatment: Gait belt Activity Tolerance: Patient tolerated treatment well Patient left: in chair (sitter present) Nurse Communication: Mobility status for ambulation  Kristi Snow 03/10/2011, 10:29 AM  [facility] PT [phone]

## 2011-03-10 NOTE — Progress Notes (Signed)
Inpatient Diabetes Program Recommendations  AACE/ADA: New Consensus Statement on Inpatient Glycemic Control (2009)  Target Ranges:  Prepandial:   less than 140 mg/dL      Peak postprandial:   less than 180 mg/dL (1-2 hours)      Critically ill patients:  140 - 180 mg/dL   Reason for Visit: CBGs 03/09/11  81-154-55-87-115 mg/dl    1/61/09  60-454 mg/dl Change NPH dosages to 15 units every AM and 5 units every HS.  Note: Fasting CBGs less than 100 mg/dl.  Did not receive NPH 10 units @ HS on 03/09/11.

## 2011-03-10 NOTE — Progress Notes (Signed)
.  Clinical social worker completed patient psychosocial assessment, please see assessment in patient shadow chart.  .Clinical social worker initiated assisted living facility search, see placement note in patient shadow chart. Pt son had initiated assisted living search and was interested in [facility]. CSW assisted with dc plans to assisted living. Pt will be discharging to [facility] this evening with transportation provided by pt son with assisted living discharge packet. .No further Clinical Social Work needs, signing off.    Catha Gosselin, Theresia Majors  [phone] .03/10/2011 16:27pm

## 2011-05-05 ENCOUNTER — Encounter: Payer: Self-pay | Admitting: Obstetrics and Gynecology

## 2011-05-10 ENCOUNTER — Encounter (INDEPENDENT_AMBULATORY_CARE_PROVIDER_SITE_OTHER): Payer: Federal, State, Local not specified - PPO | Admitting: Obstetrics and Gynecology

## 2011-05-10 DIAGNOSIS — N816 Rectocele: Secondary | ICD-10-CM

## 2011-05-10 DIAGNOSIS — N814 Uterovaginal prolapse, unspecified: Secondary | ICD-10-CM

## 2011-05-10 DIAGNOSIS — N8110 Cystocele, unspecified: Secondary | ICD-10-CM

## 2011-05-18 ENCOUNTER — Emergency Department (HOSPITAL_COMMUNITY)
Admission: EM | Admit: 2011-05-18 | Discharge: 2011-05-18 | Disposition: A | Discharge status: Skilled Nursing Facility | Payer: Medicare Other | Attending: Emergency Medicine | Admitting: Emergency Medicine

## 2011-05-18 DIAGNOSIS — Z794 Long term (current) use of insulin: Secondary | ICD-10-CM | POA: Insufficient documentation

## 2011-05-18 DIAGNOSIS — I10 Essential (primary) hypertension: Secondary | ICD-10-CM | POA: Insufficient documentation

## 2011-05-18 DIAGNOSIS — E119 Type 2 diabetes mellitus without complications: Secondary | ICD-10-CM | POA: Insufficient documentation

## 2011-05-18 DIAGNOSIS — N814 Uterovaginal prolapse, unspecified: Secondary | ICD-10-CM | POA: Insufficient documentation

## 2011-05-18 MED ORDER — POLYETHYLENE GLYCOL 3350 17 G PO PACK: 17.0000 g | PACK | Freq: Every day | ORAL | Status: AC

## 2011-05-18 NOTE — ED Notes (Signed)
[name] notified for pt transport to [facility].

## 2011-05-18 NOTE — ED Provider Notes (Signed)
History     CSN: [id number]  Arrival date & time 05/18/11  1534   First MD Initiated Contact with Patient 05/18/11 1548      Chief Complaint  Patient presents with  . Vaginal Prolapse    (Consider location/radiation/quality/duration/timing/severity/associated sxs/prior treatment) HPI Pt here with prolapsed uterus.  Has h/o same, onset today.  Sx's moderate, no aggravating or alleviating factors.   Past Medical History  Diagnosis Date  . Hypertension   . Diabetes mellitus   . Back injury     No past surgical history on file.  No family history on file.  History  Substance Use Topics  . Smoking status: Never Smoker   . Smokeless tobacco: Not on file  . Alcohol Use: No    OB History    Grav Para Term Preterm Abortions TAB SAB Ect Mult Living                  Review of Systems  Unable to perform ROS: Dementia  Genitourinary:       Prolapsed uterus    Allergies  Review of patient's allergies indicates no known allergies.  Home Medications   Current Outpatient Rx  Name Route Sig Dispense Refill  . ACETAMINOPHEN 325 MG PO TABS Oral Take 650 mg by mouth daily.    Marland Kitchen CILOSTAZOL 100 MG PO TABS Oral Take 100 mg by mouth 2 (two) times daily.      . DONEPEZIL HCL 5 MG PO TABS Oral Take 5 mg by mouth daily.     . INSULIN ISOPHANE HUMAN 100 UNIT/ML [place] SUSP Subcutaneous Inject 20 Units into the skin daily before breakfast.      . ISOSORBIDE MONONITRATE 20 MG PO TABS Oral Take 20 mg by mouth 2 (two) times daily. Space doses by at least (7) hours.     . ORPHENADRINE CITRATE 100 MG PO TB12 Oral Take 100 mg by mouth 2 (two) times daily. With tylenol    . POTASSIUM CHLORIDE CRYS ER 20 MEQ PO TBCR Oral Take 20 mEq by mouth daily.      Marland Kitchen VERAPAMIL HCL ER 240 MG PO TBCR Oral Take 240 mg by mouth at bedtime.      Marland Kitchen ZOLPIDEM TARTRATE 5 MG PO TABS Oral Take 1 tablet (5 mg total) by mouth at bedtime. 30 tablet 0  . POLYETHYLENE GLYCOL 3350 PO PACK Oral Take 17 g by mouth daily. 100  each 0    BP 191/76  Pulse 82  Temp(Src) 97.7 F (36.5 C) (Oral)  Resp 16  SpO2 99%ra wnl  Physical Exam  Nursing note and vitals reviewed. Constitutional: She appears well-developed and well-nourished.  HENT:  Head: Normocephalic and atraumatic.  Eyes: Right eye exhibits no discharge. Left eye exhibits no discharge.  Cardiovascular: Normal rate, regular rhythm and normal heart sounds.   Pulmonary/Chest: Effort normal and breath sounds normal.  Abdominal: Soft. There is no tenderness.  Genitourinary:       Uterus prolapsed with cervix external  Skin: Skin is warm and dry.    ED Course  Procedures (including critical care time)  Labs Reviewed - No data to display No results found.   1. Prolapsed uterus       MDM  Pt is in nad, afvss, nontoxic appearing, exam and hx as above. Pt has prolapsed ueterus, cx external, h/o same.  Reduced without difficulty using sterile gauze.  Then elevated cx and uterus with digits.  Will d/c to NH and have pt  start miralax.         Elijio Miles, MD 05/18/11 [phone]

## 2011-05-18 NOTE — ED Notes (Signed)
Discharge instructions given to [name] and through report given to facility.  Pt transported back to [facility].  NAD noted.

## 2011-05-18 NOTE — Discharge Instructions (Signed)
Prolapse  Prolapse means the falling down, bulging, dropping, or drooping of a body part. Organs that commonly prolapse include the rectum, small intestine, bladder, urethra, vagina (birth canal), uterus (womb), and cervix. Prolapse occurs when the ligaments and muscle tissue around the rectum, bladder, and uterus are damaged or weakened.  CAUSES  This happens especially with:  Childbirth. Some women feel pelvic pressure or have trouble holding their urine right after childbirth, because of stretching and tearing of pelvic tissues. This generally gets better with time and the feeling usually goes away, but it may return with aging.   Chronic heavy lifting.   Aging.   Menopause, with loss of estrogen production weakening the pelvic ligaments and muscles.   Past pelvic surgery.   Obesity.   Chronic constipation.   Chronic cough.  Prolapse may affect a single organ, or several organs may prolapse at the same time. The front wall of the vagina holds up the bladder. The back wall holds up part of the lower intestine, or rectum. The uterus fills a spot in the middle. All these organs can be involved when the ligaments and muscles around the vagina relax too much. This often gets worse when women stop producing estrogen (menopause). SYMPTOMS  Uncontrolled loss of urine (incontinence) with cough, sneeze, straining, and exercise.   More force may be required to have a bowel movement, due to trapping of the stool.   When part of an organ bulges through the opening of the vagina, there is sometimes a feeling of heaviness or pressure. It may feel as though something is falling out. This sensation increases with coughing or bearing down.   If the organs protrude through the opening of the vagina and rub against the clothing, there may be soreness, ulcers, infection, pain, and bleeding.   Lower back pain.   Pushing in the upper or lower part of the vagina, to pass urine or have a bowel movement.     Problems having sexual intercourse.   Being unable to insert a tampon or applicator.  DIAGNOSIS  Usually, a physical exam is all that is needed to identify the problem. During the examination, you may be asked to cough and strain while lying down, sitting up, and standing up. Your caregiver will determine if more testing is required, such as bladder function tests. Some diagnoses are:  Cystocele: Bulging and falling of the bladder into the top of the vagina.   Rectocele: Part of the rectum bulging into the vagina.   Prolapse of the uterus: The uterus falls or drops into the vagina.   Enterocele: Bulging of the top of the vagina, after a hysterectomy (uterus removal), with the small intestine bulging into the vagina. A hernia in the top of the vagina.   Urethrocele: The urethra (urine carrying tube) bulging into the vagina.  TREATMENT  In most cases, prolapse needs to be treated only if it produces symptoms. If the symptoms are interfering with your usual daily or sexual activities, treatment may be necessary. The following are some measures that may be used to treat prolapse.  Estrogen may help elderly women with mild prolapse.   Kegel exercises may help mild cases of prolapse, by strengthening and tightening the muscles of the pelvic floor.   Pessaries are used in women who choose not to, or are unable to, have surgery. A pessary is a doughnut-shaped piece of plastic or rubber that is put into the vagina to keep the organs in place. This device must   be fitted by your caregiver. Your caregiver will also explain how to care for yourself with the pessary. If it works well for you, this may be the only treatment required.   Surgery is often the only form of treatment for more severe prolapses. There are different types of surgery available. You should discuss what the best procedure is for you. If the uterus is prolapsed, it may be removed (hysterectomy) as part of the surgical treatment.  Your caregiver will discuss the risks and benefits with you.   Uterine-vaginal suspension (surgery to hold up the organs) may be used, especially if you want to maintain your fertility.  No form of treatment is guaranteed to correct the prolapse or relieve the symptoms. HOME CARE INSTRUCTIONS   Wear a sanitary pad or absorbent product if you have incontinence of urine.   Avoid heavy lifting and straining with exercise and work.   Take over-the-counter pain medicine for minor discomfort.   Try taking estrogen or using estrogen vaginal cream.   Try Kegel exercises or use a pessary, before deciding to have surgery.   Do Kegel exercises after having a baby.  SEEK MEDICAL CARE IF:   Your symptoms interfere with your daily activities.   You need medicine to help with the discomfort.   You need to be fitted with a pessary.   You notice bleeding from the vagina.   You think you have ulcers or you notice ulcers on the cervix.   You have an oral temperature above 102 F (38.9 C).   You develop pain or blood with urination.   You have bleeding with a bowel movement.   The symptoms are interfering with your sex life.   You have urinary incontinence that interferes with your daily activities.   You lose urine with sexual intercourse.   You have a chronic cough.   You have chronic constipation.  Document Released: 08/07/2002 Document Revised: 01/20/2011 Document Reviewed: 02/15/2009 [facility] Patient Information 2012 [place], [place].  Return for any new or worsening symptoms or any other concerns.

## 2011-05-18 NOTE — ED Notes (Signed)
Pt brought via EMS from [facility] for prolapsed uterus. Facility states pt has been a resident at their facility for 2 days, has a hx of prolapse uterus and they wanted to have pt observed.

## 2011-05-19 NOTE — ED Provider Notes (Signed)
I saw and evaluated the patient, reviewed the resident's note and I agree with the findings and plan.   Loren Racer, MD 05/19/11 [phone]

## 2011-05-22 ENCOUNTER — Other Ambulatory Visit: Payer: Self-pay

## 2011-05-22 ENCOUNTER — Inpatient Hospital Stay (HOSPITAL_COMMUNITY)
Admission: EM | Admit: 2011-05-22 | Discharge: 2011-05-27 | DRG: 100 | Disposition: A | Discharge status: Skilled Nursing Facility | Payer: Medicare Other | Attending: Internal Medicine | Admitting: Internal Medicine

## 2011-05-22 ENCOUNTER — Emergency Department (HOSPITAL_COMMUNITY): Payer: Medicare Other

## 2011-05-22 ENCOUNTER — Encounter (HOSPITAL_COMMUNITY): Payer: Self-pay

## 2011-05-22 DIAGNOSIS — E872 Acidosis, unspecified: Secondary | ICD-10-CM | POA: Diagnosis present

## 2011-05-22 DIAGNOSIS — B952 Enterococcus as the cause of diseases classified elsewhere: Secondary | ICD-10-CM | POA: Diagnosis present

## 2011-05-22 DIAGNOSIS — E860 Dehydration: Secondary | ICD-10-CM | POA: Diagnosis present

## 2011-05-22 DIAGNOSIS — B9620 Unspecified Escherichia coli [E. coli] as the cause of diseases classified elsewhere: Secondary | ICD-10-CM | POA: Diagnosis present

## 2011-05-22 DIAGNOSIS — R739 Hyperglycemia, unspecified: Secondary | ICD-10-CM

## 2011-05-22 DIAGNOSIS — L723 Sebaceous cyst: Secondary | ICD-10-CM | POA: Diagnosis present

## 2011-05-22 DIAGNOSIS — I498 Other specified cardiac arrhythmias: Secondary | ICD-10-CM | POA: Diagnosis not present

## 2011-05-22 DIAGNOSIS — M549 Dorsalgia, unspecified: Secondary | ICD-10-CM

## 2011-05-22 DIAGNOSIS — G9389 Other specified disorders of brain: Secondary | ICD-10-CM | POA: Diagnosis present

## 2011-05-22 DIAGNOSIS — N814 Uterovaginal prolapse, unspecified: Secondary | ICD-10-CM

## 2011-05-22 DIAGNOSIS — E870 Hyperosmolality and hypernatremia: Secondary | ICD-10-CM | POA: Diagnosis present

## 2011-05-22 DIAGNOSIS — R569 Unspecified convulsions: Principal | ICD-10-CM | POA: Diagnosis present

## 2011-05-22 DIAGNOSIS — F0390 Unspecified dementia without behavioral disturbance: Secondary | ICD-10-CM | POA: Diagnosis present

## 2011-05-22 DIAGNOSIS — R4182 Altered mental status, unspecified: Secondary | ICD-10-CM

## 2011-05-22 DIAGNOSIS — R001 Bradycardia, unspecified: Secondary | ICD-10-CM | POA: Diagnosis not present

## 2011-05-22 DIAGNOSIS — J9600 Acute respiratory failure, unspecified whether with hypoxia or hypercapnia: Secondary | ICD-10-CM | POA: Diagnosis present

## 2011-05-22 DIAGNOSIS — Z79899 Other long term (current) drug therapy: Secondary | ICD-10-CM

## 2011-05-22 DIAGNOSIS — E876 Hypokalemia: Secondary | ICD-10-CM | POA: Diagnosis present

## 2011-05-22 DIAGNOSIS — E1165 Type 2 diabetes mellitus with hyperglycemia: Secondary | ICD-10-CM | POA: Diagnosis present

## 2011-05-22 DIAGNOSIS — IMO0001 Reserved for inherently not codable concepts without codable children: Secondary | ICD-10-CM | POA: Diagnosis present

## 2011-05-22 DIAGNOSIS — Z794 Long term (current) use of insulin: Secondary | ICD-10-CM

## 2011-05-22 DIAGNOSIS — N390 Urinary tract infection, site not specified: Secondary | ICD-10-CM | POA: Diagnosis present

## 2011-05-22 DIAGNOSIS — G9341 Metabolic encephalopathy: Secondary | ICD-10-CM | POA: Diagnosis present

## 2011-05-22 DIAGNOSIS — IMO0002 Reserved for concepts with insufficient information to code with codable children: Secondary | ICD-10-CM | POA: Diagnosis present

## 2011-05-22 DIAGNOSIS — M48061 Spinal stenosis, lumbar region without neurogenic claudication: Secondary | ICD-10-CM

## 2011-05-22 DIAGNOSIS — I10 Essential (primary) hypertension: Secondary | ICD-10-CM | POA: Diagnosis present

## 2011-05-22 HISTORY — DX: Unspecified dementia, unspecified severity, without behavioral disturbance, psychotic disturbance, mood disturbance, and anxiety: F03.90

## 2011-05-22 HISTORY — DX: Spinal stenosis, lumbar region without neurogenic claudication: M48.061

## 2011-05-22 HISTORY — DX: Uterovaginal prolapse, unspecified: N81.4

## 2011-05-22 HISTORY — DX: Encephalopathy, unspecified: G93.40

## 2011-05-22 HISTORY — DX: Urinary tract infection, site not specified: N39.0

## 2011-05-22 LAB — GLUCOSE, CAPILLARY
Glucose-Capillary: 234 mg/dL — ABNORMAL HIGH (ref 70–99)
Glucose-Capillary: 298 mg/dL — ABNORMAL HIGH (ref 70–99)
Glucose-Capillary: 378 mg/dL — ABNORMAL HIGH (ref 70–99)
Glucose-Capillary: 385 mg/dL — ABNORMAL HIGH (ref 70–99)
Glucose-Capillary: 386 mg/dL — ABNORMAL HIGH (ref 70–99)
Glucose-Capillary: 404 mg/dL — ABNORMAL HIGH (ref 70–99)

## 2011-05-22 LAB — URINALYSIS, ROUTINE W REFLEX MICROSCOPIC
Bilirubin Urine: NEGATIVE
Glucose, UA: >1000 mg/dL — AB
Ketones, ur: NEGATIVE mg/dL
Leukocytes, UA: NEGATIVE
Nitrite: POSITIVE — AB
Protein, ur: NEGATIVE mg/dL
Specific Gravity, Urine: 1.039 — ABNORMAL HIGH (ref 1.005–1.030)
Urobilinogen, UA: 0.2 mg/dL (ref 0.0–1.0)
pH: 5 (ref 5.0–8.0)

## 2011-05-22 LAB — DIFFERENTIAL
Basophils Absolute: 0 10*3/uL (ref 0.0–0.1)
Basophils Relative: 0 % (ref 0–1)
Eosinophils Absolute: 0 10*3/uL (ref 0.0–0.7)
Eosinophils Relative: 0 % (ref 0–5)
Lymphocytes Relative: 16 % (ref 12–46)
Lymphs Abs: 1 10*3/uL (ref 0.7–4.0)
Monocytes Absolute: 0.1 10*3/uL (ref 0.1–1.0)
Monocytes Relative: 2 % — ABNORMAL LOW (ref 3–12)
Neutro Abs: 5.4 10*3/uL (ref 1.7–7.7)
Neutrophils Relative %: 82 % — ABNORMAL HIGH (ref 43–77)

## 2011-05-22 LAB — COMPREHENSIVE METABOLIC PANEL
ALT: 13 U/L (ref 0–35)
Alkaline Phosphatase: 136 U/L — ABNORMAL HIGH (ref 39–117)
BUN: 25 mg/dL — ABNORMAL HIGH (ref 6–23)
CO2: 23 mEq/L (ref 19–32)
Chloride: 109 mEq/L (ref 96–112)
GFR calc Af Amer: 61 mL/min — ABNORMAL LOW (ref >90)
GFR calc non Af Amer: 53 mL/min — ABNORMAL LOW (ref >90)
Glucose, Bld: 693 mg/dL (ref 70–99)
Potassium: 3.2 mEq/L — ABNORMAL LOW (ref 3.5–5.1)
Total Bilirubin: 0.4 mg/dL (ref 0.3–1.2)
Total Protein: 7.7 g/dL (ref 6.0–8.3)

## 2011-05-22 LAB — LACTIC ACID, PLASMA: Lactic Acid, Venous: 3.5 mmol/L — ABNORMAL HIGH (ref 0.5–2.2)

## 2011-05-22 LAB — MRSA PCR SCREENING: MRSA by PCR: NEGATIVE

## 2011-05-22 LAB — APTT: aPTT: 31 seconds (ref 24–37)

## 2011-05-22 LAB — CBC
Hemoglobin: 14.6 g/dL (ref 12.0–15.0)
MCH: 30 pg (ref 26.0–34.0)
RBC: 4.86 MIL/uL (ref 3.87–5.11)
WBC: 6.5 10*3/uL (ref 4.0–10.5)

## 2011-05-22 LAB — PROTIME-INR
INR: 1.12 (ref 0.00–1.49)
Prothrombin Time: 14.6 seconds (ref 11.6–15.2)

## 2011-05-22 LAB — POCT I-STAT 3, VENOUS BLOOD GAS (G3P V)
O2 Saturation: 66 %
pCO2, Ven: 49.7 mmHg (ref 45.0–50.0)
pH, Ven: 7.322 — ABNORMAL HIGH (ref 7.250–7.300)

## 2011-05-22 LAB — URINE MICROSCOPIC-ADD ON

## 2011-05-22 LAB — TROPONIN I: Troponin I: <0.3 ng/mL (ref <0.30)

## 2011-05-22 MED ORDER — ONDANSETRON HCL 4 MG PO TABS: 4.0000 mg | ORAL_TABLET | Freq: Four times a day (QID) | ORAL | Status: DC | PRN

## 2011-05-22 MED ORDER — LORAZEPAM 2 MG/ML IJ SOLN: 1.0000 mg | Freq: Once | INTRAMUSCULAR | Status: DC

## 2011-05-22 MED ORDER — SODIUM CHLORIDE 0.9 % IV SOLN
INTRAVENOUS | Status: DC
  Administered 2011-05-22: 3.3 [IU]/h via INTRAVENOUS
  Filled 2011-05-22 (×2): qty 1

## 2011-05-22 MED ORDER — SODIUM CHLORIDE 0.9 % IJ SOLN
3.0000 mL | Freq: Two times a day (BID) | INTRAMUSCULAR | Status: DC
  Administered 2011-05-23 – 2011-05-25 (×3): 3 mL via INTRAVENOUS
  Administered 2011-05-25: via INTRAVENOUS
  Administered 2011-05-26 – 2011-05-27 (×3): 3 mL via INTRAVENOUS

## 2011-05-22 MED ORDER — SODIUM CHLORIDE 0.9 % IV SOLN
1000.0000 mg | INTRAVENOUS | Status: AC
  Administered 2011-05-22: 1000 mg via INTRAVENOUS
  Filled 2011-05-22: qty 10

## 2011-05-22 MED ORDER — SODIUM CHLORIDE 0.9 % IV SOLN
Freq: Once | INTRAVENOUS | Status: AC
  Administered 2011-05-22: 15:00:00 via INTRAVENOUS

## 2011-05-22 MED ORDER — DEXTROSE 5 % IV SOLN
1.0000 g | Freq: Once | INTRAVENOUS | Status: AC
  Administered 2011-05-22: 1 g via INTRAVENOUS
  Filled 2011-05-22: qty 10

## 2011-05-22 MED ORDER — SODIUM CHLORIDE 0.9 % IV SOLN
INTRAVENOUS | Status: DC
  Administered 2011-05-22: 5.2 [IU]/h via INTRAVENOUS
  Administered 2011-05-22: 4.8 [IU]/h via INTRAVENOUS
  Administered 2011-05-22: 6.9 [IU]/h via INTRAVENOUS
  Administered 2011-05-23: 0.6 [IU]/h via INTRAVENOUS
  Administered 2011-05-23: 3.2 [IU]/h via INTRAVENOUS
  Administered 2011-05-23: 1.3 [IU]/h via INTRAVENOUS
  Filled 2011-05-22: qty 1

## 2011-05-22 MED ORDER — SODIUM CHLORIDE 0.9 % IV SOLN
INTRAVENOUS | Status: DC
  Administered 2011-05-22: 22:00:00 via INTRAVENOUS

## 2011-05-22 MED ORDER — INSULIN REGULAR BOLUS VIA INFUSION
0.0000 [IU] | Freq: Three times a day (TID) | INTRAVENOUS | Status: DC
  Filled 2011-05-22: qty 10

## 2011-05-22 MED ORDER — LORAZEPAM 2 MG/ML IJ SOLN
1.0000 mg | INTRAMUSCULAR | Status: DC | PRN
  Administered 2011-05-22 – 2011-05-23 (×2): 1 mg via INTRAVENOUS
  Filled 2011-05-22 (×2): qty 1

## 2011-05-22 MED ORDER — ACETAMINOPHEN 650 MG RE SUPP: 650.0000 mg | Freq: Four times a day (QID) | RECTAL | Status: DC | PRN

## 2011-05-22 MED ORDER — POTASSIUM CHLORIDE 10 MEQ/100ML IV SOLN
10.0000 meq | INTRAVENOUS | Status: AC
  Administered 2011-05-22 – 2011-05-23 (×4): 10 meq via INTRAVENOUS
  Filled 2011-05-22 (×4): qty 100

## 2011-05-22 MED ORDER — ACETAMINOPHEN 325 MG PO TABS: 650.0000 mg | ORAL_TABLET | Freq: Four times a day (QID) | ORAL | Status: DC | PRN

## 2011-05-22 MED ORDER — DEXTROSE-NACL 5-0.45 % IV SOLN
INTRAVENOUS | Status: DC
  Administered 2011-05-22: via INTRAVENOUS

## 2011-05-22 MED ORDER — INSULIN ASPART 100 UNIT/ML ~~LOC~~ SOLN
10.0000 [IU] | Freq: Once | SUBCUTANEOUS | Status: AC
  Administered 2011-05-22: 10 [IU] via INTRAVENOUS
  Filled 2011-05-22: qty 1

## 2011-05-22 MED ORDER — SODIUM CHLORIDE 0.9 % IV SOLN: INTRAVENOUS | Status: DC

## 2011-05-22 MED ORDER — DEXTROSE 50 % IV SOLN: 25.0000 mL | INTRAVENOUS | Status: DC | PRN

## 2011-05-22 MED ORDER — LORAZEPAM 2 MG/ML IJ SOLN
1.0000 mg | Freq: Once | INTRAMUSCULAR | Status: AC
  Administered 2011-05-22: 1 mg via INTRAVENOUS
  Filled 2011-05-22 (×2): qty 1

## 2011-05-22 MED ORDER — SODIUM CHLORIDE 0.9 % IV SOLN
500.0000 mg | Freq: Two times a day (BID) | INTRAVENOUS | Status: DC
  Administered 2011-05-23 – 2011-05-24 (×3): 500 mg via INTRAVENOUS
  Filled 2011-05-22 (×4): qty 5

## 2011-05-22 MED ORDER — ONDANSETRON HCL 4 MG/2ML IJ SOLN: 4.0000 mg | Freq: Four times a day (QID) | INTRAMUSCULAR | Status: DC | PRN

## 2011-05-22 NOTE — ED Notes (Signed)
IV team paged.  

## 2011-05-22 NOTE — ED Notes (Signed)
IV team returned page, will be down shortly to start 2 lines, one for IVF and one for keppra

## 2011-05-22 NOTE — ED Notes (Signed)
Report attempted   rn will need to call me back 

## 2011-05-22 NOTE — ED Notes (Signed)
The neurologist is seeing the pt

## 2011-05-22 NOTE — Consult Note (Signed)
Reason for Consult: Hydrocephalus Referring Physician: EDP  Kristi Snow is an 78 y.o. female.   HPI:  This is an 78 year old patient who was transferred by EMS from her nursing home after an apparent seizure around lunchtime today. He had a second seizure in the parking lot. Patient is unable to give a history and the history is taken from the chart. The patient was in her usual state of health and mental status prior to the seizure. Specifically, she did not according to the EDP have a history of progressive headache or mental status changes or fever or chills. Her glucose was high and she was found to have a urinary tract infection on her ED labs. CT scan of the head showed ventriculomegaly. She has a history of dementia, but I can find no comparison scans to know if this is acute hydrocephalus or long-standing ventriculomegaly. She was started on Rocephin for her UTI. She is on an insulin drip. She has received Versed in the field and Ativan here in the hospital.  Past Medical History  Diagnosis Date  . Hypertension   . Diabetes mellitus   . Back injury   . Encephalopathy, unspecified   . Urinary tract infection   . Dementia   . Prolapsed uterus   . Lumbar spinal stenosis     Past Surgical History  Procedure Date  . No past surgeries     No Known Allergies  History  Substance Use Topics  . Smoking status: Never Smoker   . Smokeless tobacco: Not on file  . Alcohol Use: No    History reviewed. No pertinent family history.   Review of Systems  Positive ROS: Able to obtain  All other systems have been reviewed and were otherwise negative with the exception of those mentioned in the HPI and as above.  Objective: Vital signs in last 24 hours: Temp:  [97.7 F (36.5 C)-98.8 F (37.1 C)] 98.8 F (37.1 C) (04/07 2148) Pulse Rate:  [94-103] 94  (04/07 2148) Resp:  [18-19] 18  (04/07 2148) BP: (126-156)/(65-102) 156/65 mmHg (04/07 2148) SpO2:  [98 %-100 %] 98 % (04/07  2148) Weight:  [58.8 kg (129 lb 10.1 oz)] 58.8 kg (129 lb 10.1 oz) (04/07 2148)  General Appearance: Elderly African American female and a stretcher Head: Normocephalic, without obvious abnormality, atraumatic Eyes: pupils equal, she has likely had cataract surgery, unable to test EOM     Neck: I question whether she has nuchal rigidity Lungs: Clear to auscultation bilaterally, respirations unlabored Heart: Regular rate and rhythm Abdomen: Soft Extremities: Extremities normal, atraumatic, no cyanosis or edema Pulses: 2+ and symmetric all extremities   NEUROLOGIC:   Mental status: Somnolent but opens eyes to painful stimuli Motor Exam - moves all 4 extremities and localizes pain Sensory Exam - unable to test Coordination - unable to test Gait -unable to test Balance - unable to test Cranial Nerves: I: smell Not tested  II: visual acuity  OS: na   OD: na  II: visual fields  unable to test   II: pupils  as above   III,VII: ptosis  unable to test   III,IV,VI: extraocular muscles   unable to test   V: mastication  unable to test   V: facial light touch sensation   unable to test   V,VII: corneal reflex   present   VII: facial muscle function - upper   unable to test but no asymmetry   VII: facial muscle function - lower  unable  to test but no asymmetry   VIII: hearing   IX: soft palate elevation   unable to test   IX,X: gag reflex  unable to test   XI: trapezius strength   unable to test   XI: sternocleidomastoid strength  unable to test   XI: neck flexion strength   unable to test   XII: tongue strength   unable to test     Data Review Lab Results  Component Value Date   WBC 6.5 05/22/2011   HGB 14.6 05/22/2011   HCT 44.5 05/22/2011   MCV 91.6 05/22/2011   PLT 257 05/22/2011   Lab Results  Component Value Date   NA 150* 05/22/2011   K 3.2* 05/22/2011   CL 109 05/22/2011   CO2 23 05/22/2011   BUN 25* 05/22/2011   CREATININE 0.96 05/22/2011   GLUCOSE 693* 05/22/2011   Lab Results   Component Value Date   INR 1.12 05/22/2011    Radiology: No results ound. CT scan: Shows ventriculomegaly involving the lateral and third ventricles without mass lesion noted in the cerebellum or significant edema anywhere, she has cortical atrophy, basal cisterns are open  Assessment/Plan: 78 year old with a seizure, UTI, and hyperglycemia, history of dementia, ventriculomegaly on CT scan of the head. It is difficult to know if this represents true hydrocephalus or just ventriculomegaly. Hydrocephalus can cause a seizure, but it isn't uncommon presenting sign in the absence of previous shunting, infection, or tumor. He may have nuchal rigidity but no evidence of Kernig's or Brudzinski's sign, and a normal white count and no fever. Also, her presentation of normal mental status until her seizure would argue against infection or inflammation causing acute obstructive hydrocephalus. One would suspect that she would've had a slowly declining course from hydrocephalus and less she has meningitis or other inflammatory cause of acute obstructive hydrocephalus. She has been started on Keppra and antibiotics. One could consider an LP to measure opening pressure and to send a small amount of cerebrospinal fluid for studies. This would rule out meningitis and obstructive hydrocephalus ( high opening pressure would argue for hydrocephalus and against a simple ventriculomegaly). MRI has been ordered this will also help. I do not believe she needs a ventriculostomy or a shunt acutely. Her mental status changes at this point are just as likely to be related to post ictal state and benzodiazepines as they are to obstructive hydrocephalus, and these procedures are not without risk.   Opie Maclaughlin S 05/22/2011 10:10 PM

## 2011-05-22 NOTE — H&P (Addendum)
History and Physical  Kristi Snow JXB:[id number] DOB: 01-18-1925 DOA: 05/22/2011  Referring physician: Glynn Octave, MD PCP: Alva Garnet., MD, MD   Chief Complaint: seizure  HPI:  78 year-old woman with a history of dementia who presented via EMS from [facility] with new onset seizure. Around lunchtime  today the patient had a seizure. She had a second seizure in the parking lot lasting approximately 2 minutes. She was treated with Versed.  Blood pressure on the scene was  190/100. Capillary blood sugar high. patient's transport to the emergency department.  In the emergency department the patient was noted to be postictal but protecting her airway although she was not able to follow commands. She was afebrile, vital signs stable. Venous pH within normal limits. Blood cultures and urine culture obtained.  CBC unremarkable. Chemistry panel notable for marked hyperglycemia, moderate hypernatremia and mild hypokalemia. EKG was unremarkable. CT of head was abnormal demonstrating moderate ventriculomegaly.  ED documentation incomplete at time of review but discussed in person with referring physician.  Chart review:  03/07/2011-03/10/2011 hospitalization: UTI, acute encephalopathy superimposed on dementia, uncontrolled diabetes mellitus  Review of Systems:  Patient unable to answer however her son reports patient is been in good health lately with no problems. She is in assisted living but is able to provide for mostly her own needs although she does require assistance with medication.  Past Medical History  Diagnosis Date  . Hypertension   . Diabetes mellitus   . Back injury   . Encephalopathy, unspecified   . Urinary tract infection   . Dementia   . Prolapsed uterus   . Lumbar spinal stenosis    Past Surgical History  Procedure Date  . No past surgeries    Social History:  reports that she has never smoked. She does not have any smokeless tobacco  history on file. She reports that she does not drink alcohol or use illicit drugs.  No Known Allergies  Family history: none known  Prior to Admission medications   Medication Sig Start Date End Date Taking? Authorizing Provider  acetaminophen (TYLENOL) 500 MG tablet Take 500 mg by mouth 3 (three) times daily.   Yes Historical Provider, MD  cilostazol (PLETAL) 100 MG tablet Take 100 mg by mouth 2 (two) times daily.     Yes Historical Provider, MD  donepezil (ARICEPT) 5 MG tablet Take 5 mg by mouth daily.    Yes Historical Provider, MD  insulin NPH (HUMULIN N,NOVOLIN N) 100 UNIT/ML injection Inject 20 Units into the skin daily before breakfast.     Yes Historical Provider, MD  isosorbide mononitrate (ISMO,MONOKET) 20 MG tablet Take 20 mg by mouth 2 (two) times daily. Space doses by at least (7) hours.    Yes Historical Provider, MD  LORazepam (ATIVAN) 0.5 MG tablet Take 0.5 mg by mouth every 8 (eight) hours as needed. For anxiety.   Yes Historical Provider, MD  Melatonin 3 MG CAPS Take 1 capsule by mouth at bedtime.   Yes Historical Provider, MD  orphenadrine (NORFLEX) 100 MG tablet Take 100 mg by mouth 2 (two) times daily. With tylenol   Yes Historical Provider, MD  oxycodone (OXY-IR) 5 MG capsule Take 5 mg by mouth every 8 (eight) hours as needed. For pain.   Yes Historical Provider, MD  polyethylene glycol (MIRALAX / GLYCOLAX) packet Take 17 g by mouth daily.   Yes Historical Provider, MD  sertraline (ZOLOFT) 25 MG tablet Take 25 mg by mouth every morning.  Yes Historical Provider, MD  simvastatin (ZOCOR) 10 MG tablet Take 10 mg by mouth at bedtime.   Yes Historical Provider, MD  verapamil (CALAN-SR) 240 MG CR tablet Take 240 mg by mouth at bedtime.     Yes Historical Provider, MD  Vitamin D, Ergocalciferol, (DRISDOL) 50000 UNITS CAPS Take 50,000 Units by mouth every 7 (seven) days. Takes on Fridays.   Yes Historical Provider, MD  polyethylene glycol (MIRALAX / GLYCOLAX) packet Take 17 g by  mouth daily. 05/18/11 05/21/11  Elijio Miles, MD   Physical Exam: Filed Vitals:   05/22/11 1400 05/22/11 1402 05/22/11 1610  BP: 154/83    Pulse: 103    Temp:  97.7 F (36.5 C) 98 F (36.7 C)  TempSrc:  Oral Rectal  Resp: 19    SpO2: 100%      General:  Examined in the emergency department. Patient lying flat on stretcher with mild restlessness but no agitation. Does not oriented on examiner, does not respond verbally or follow commands  Eyes:  Pupils equal, round, react to light. Normal lids, irises, conjunctiva.  ENT:  Unable to assess hearing. Lips and tongue appear unremarkable.  Neck:  No lymphadenopathy or masses. No thyromegaly.  Cardiovascular:  Regular rate and rhythm. No murmur, rub, gallop. No lower extremity edema.  Respiratory:  Clear to auscultation bilaterally. No wheezes, rales, rhonchi. Normal respiratory effort. Appears to be protecting airway at this time.  Abdomen:  Soft, nontender, nondistended.  Skin:  Appears grossly unremarkable.  Musculoskeletal:  Tone and strength in the upper lower extremities appears to be symmetric. Spontaneous movement observed of her extremities but she cannot follow commands.  Psychiatric:  Unable to assess. Patient's eyes are open but she does not respond.  Neurologic:  As above.  Labs on Admission:  Basic Metabolic Panel:  Lab 05/22/11 1610  NA 150*  K 3.2*  CL 109  CO2 23  GLUCOSE 693*  BUN 25*  CREATININE 0.96  CALCIUM 10.2  MG --  PHOS --   Liver Function Tests:  Lab 05/22/11 1454  AST 13  ALT 13  ALKPHOS 136*  BILITOT 0.4  PROT 7.7  ALBUMIN 3.7   CBC:  Lab 05/22/11 1454  WBC 6.5  NEUTROABS 5.4  HGB 14.6  HCT 44.5  MCV 91.6  PLT 257   Cardiac Enzymes:  Lab 05/22/11 1454  CKTOTAL --  CKMB --  CKMBINDEX --  TROPONINI <0.30   CBG:  Lab 05/22/11 1552 05/22/11 1400  GLUCAP 385* >600*   Radiological Exams on Admission: Ct Head Wo Contrast  05/22/2011  *RADIOLOGY REPORT*  Clinical Data:  Seizure  CT HEAD WITHOUT CONTRAST  Technique:  Contiguous axial images were obtained from the base of the skull through the vertex without contrast.  Comparison: None.  Findings: Moderate ventricular enlargement involving the third and lateral ventricles.  Hypodensity in the periventricular white matter, this could be due to transependymal resorption of CSF or chronic ischemia.  No acute infarct.  Negative for hemorrhage or mass lesion.  Mild chronic sinusitis.  No acute skull abnormality.  Scaphocephaly of the calvarium is noted.  IMPRESSION: Moderate ventricular enlargement of the third and  lateral ventricles.  This may indicate obstructive hydrocephalous. Communicating hydrocephalus is also possible.  Comparison with prior imaging is suggested to determine stability.  If no prior studies are available, MRI may be helpful.  Original Report Authenticated By: Camelia Phenes, M.D.   Dg Chest Port 1 View  05/22/2011  *RADIOLOGY REPORT*  Clinical Data: Seizure, hyperglycemia  PORTABLE CHEST - 1 VIEW  Comparison: None.  Findings: Cardiomediastinal silhouette is unremarkable. Degenerative changes thoracic spine.  Extensive degenerative changes bilateral shoulders.  No acute infiltrate or pulmonary edema.  No diagnostic pneumothorax.  IMPRESSION: No active disease.  Degenerative changes thoracic spine and bilateral shoulders.  Original Report Authenticated By: Natasha Mead, M.D.   EKG: Independently reviewed. Sinus tachycardia. No acute changes.  Assessment/Plan 1. New onset seizure: Etiology unclear the patient does have a history of dementia. No evidence to suggest infection (afebrile, no leukocytosis, no prodrome). Discontinue Zoloft and Aricept for now. Ventriculomegaly noted. Case was discussed with Dr. Yetta Barre of neurosurgery who does not think this represents obstructing hydrocephalus and does not require any neurosurgical intervention at this time. Neurology consultation pending. Keppra. At this time patient  protecting airway and appears to be stable for admission to step down unit. 2. Acute encephalopathy: Secondary to seizure (post-ictal). Monitor. 3. Moderate ventriculomegaly: Differential includes obstructive hydrocephalus, communicating hydrocephalus. As noted above neurosurgery does not feel that this is clinically significant at this point. Obtain MRI. 4. Possible UTI: Empiric Rocephin. Followup urine culture. 5. Diabetes mellitus, uncontrolled: IV fluids. Glucose stabilizer. Acute elevation likely related to seizure. 6. Dehydration/hypernatremia: IV fluids. 7. Lactic acidosis: likely secondary to seizure. 8. Hypokalemia: replete. 9. Dementia  Discussed with son by telephone. All questions answered to his apparent satisfaction. He wishes his mother to be full code. Condition is guarded.  Discussed with Dr. Roseanne Reno. No need for MRI tonight.  Code Status:  Full code per son Family Communication: son/de facto Evorn Gong  Disposition Plan: Pending further evaluation  Brendia Sacks, MD  [facility] Pager [phone]  If 7PM-7AM, please contact night-coverage [email or web address] Password [facility] 05/22/2011, 5:16 PM

## 2011-05-22 NOTE — ED Notes (Signed)
The pt getting more restless.  Med given

## 2011-05-22 NOTE — ED Notes (Signed)
Pt has iv from iv team

## 2011-05-22 NOTE — ED Notes (Signed)
Pt from [facility], pt with dementia, seizure in NH, another in the parking lot lasting 2 mins, versed given, first bp 190/100, second after versed and seizure 140/85, CBG HIGH

## 2011-05-22 NOTE — ED Notes (Signed)
Dr.Rancour shown results of VBG.  ED LAB.

## 2011-05-22 NOTE — ED Notes (Signed)
The pt is sleeping at present.  Iv team here to attempt an iv

## 2011-05-22 NOTE — ED Provider Notes (Signed)
History     CSN: [id number]  Arrival date & time 05/22/11  1348   First MD Initiated Contact with Patient 05/22/11 1457      Chief Complaint  Patient presents with  . Seizures    (Consider location/radiation/quality/duration/timing/severity/associated sxs/prior treatment) HPI Comments: Patient presents from nursing home with a witnessed seizure that lasted approximately 2 minutes and a second seizure on arrival to the ED. She is not postictal. She has no history of seizure disorder. She has a history of hypertension and diabetes. Her blood sugar was found to be elevated greater than 600. Family denies any recent fever, nausea, vomiting or diarrhea.  The history is provided by the EMS personnel and a relative.    Past Medical History  Diagnosis Date  . Hypertension   . Diabetes mellitus   . Back injury   . Encephalopathy, unspecified   . Urinary tract infection   . Dementia   . Prolapsed uterus   . Lumbar spinal stenosis     Past Surgical History  Procedure Date  . No past surgeries     History reviewed. No pertinent family history.  History  Substance Use Topics  . Smoking status: Never Smoker   . Smokeless tobacco: Not on file  . Alcohol Use: No    OB History    Grav Para Term Preterm Abortions TAB SAB Ect Mult Living                  Review of Systems  Unable to perform ROS: Mental status change  Neurological: Positive for seizures.    Allergies  Review of patient's allergies indicates no known allergies.  Home Medications   Current Outpatient Rx  Name Route Sig Dispense Refill  . ACETAMINOPHEN 500 MG PO TABS Oral Take 500 mg by mouth 3 (three) times daily.    Marland Kitchen CILOSTAZOL 100 MG PO TABS Oral Take 100 mg by mouth 2 (two) times daily.      . DONEPEZIL HCL 5 MG PO TABS Oral Take 5 mg by mouth daily.     . INSULIN ISOPHANE HUMAN 100 UNIT/ML [place] SUSP Subcutaneous Inject 20 Units into the skin daily before breakfast.      . ISOSORBIDE MONONITRATE 20 MG  PO TABS Oral Take 20 mg by mouth 2 (two) times daily. Space doses by at least (7) hours.     Marland Kitchen LORAZEPAM 0.5 MG PO TABS Oral Take 0.5 mg by mouth every 8 (eight) hours as needed. For anxiety.    Marland Kitchen MELATONIN 3 MG PO CAPS Oral Take 1 capsule by mouth at bedtime.    . ORPHENADRINE CITRATE 100 MG PO TB12 Oral Take 100 mg by mouth 2 (two) times daily. With tylenol    . OXYCODONE HCL 5 MG PO CAPS Oral Take 5 mg by mouth every 8 (eight) hours as needed. For pain.    Marland Kitchen POLYETHYLENE GLYCOL 3350 PO PACK Oral Take 17 g by mouth daily.    . SERTRALINE HCL 25 MG PO TABS Oral Take 25 mg by mouth every morning.    Marland Kitchen SIMVASTATIN 10 MG PO TABS Oral Take 10 mg by mouth at bedtime.    Marland Kitchen VERAPAMIL HCL ER 240 MG PO TBCR Oral Take 240 mg by mouth at bedtime.      Marland Kitchen VITAMIN D (ERGOCALCIFEROL) 50000 UNITS PO CAPS Oral Take 50,000 Units by mouth every 7 (seven) days. Takes on Fridays.    Marland Kitchen POLYETHYLENE GLYCOL 3350 PO PACK Oral Take 17  g by mouth daily. 100 each 0    BP 126/102  Pulse 103  Temp(Src) 98 F (36.7 C) (Rectal)  Resp 19  SpO2 100%  Physical Exam  Constitutional: She appears well-developed and well-nourished. No distress.       Postictal, protecting airway, not on command  HENT:  Head: Normocephalic and atraumatic.  Mouth/Throat: Oropharynx is clear and moist. No oropharyngeal exudate.  Eyes: Conjunctivae are normal.  Neck: Normal range of motion. Neck supple.       No meningismus  Cardiovascular: Normal rate, regular rhythm and normal heart sounds.   No murmur heard. Pulmonary/Chest: Effort normal and breath sounds normal. No respiratory distress. She exhibits no tenderness.  Abdominal: Soft. There is no tenderness. There is no rebound and no guarding.  Musculoskeletal: Normal range of motion. She exhibits no edema and no tenderness.  Neurological: She is alert.       Moving extremities spontaneously, postictal and not following command protecting airway  Skin: Skin is warm.    ED Course    Procedures (including critical care time)  Labs Reviewed  GLUCOSE, CAPILLARY - Abnormal; Notable for the following:    Glucose-Capillary >600 (*)    All other components within normal limits  DIFFERENTIAL - Abnormal; Notable for the following:    Neutrophils Relative 82 (*)    Monocytes Relative 2 (*)    All other components within normal limits  COMPREHENSIVE METABOLIC PANEL - Abnormal; Notable for the following:    Sodium 150 (*)    Potassium 3.2 (*)    Glucose, Bld 693 (*)    BUN 25 (*)    Alkaline Phosphatase 136 (*)    GFR calc non Af Amer 53 (*)    GFR calc Af Amer 61 (*)    All other components within normal limits  URINALYSIS, ROUTINE W REFLEX MICROSCOPIC - Abnormal; Notable for the following:    APPearance CLOUDY (*)    Specific Gravity, Urine 1.039 (*)    Glucose, UA >1000 (*)    Hgb urine dipstick TRACE (*)    Nitrite POSITIVE (*)    All other components within normal limits  LACTIC ACID, PLASMA - Abnormal; Notable for the following:    Lactic Acid, Venous 3.5 (*)    All other components within normal limits  POCT I-STAT 3, BLOOD GAS (G3P V) - Abnormal; Notable for the following:    pH, Ven 7.322 (*)    Bicarbonate 25.7 (*)    All other components within normal limits  GLUCOSE, CAPILLARY - Abnormal; Notable for the following:    Glucose-Capillary 385 (*)    All other components within normal limits  URINE MICROSCOPIC-ADD ON - Abnormal; Notable for the following:    Squamous Epithelial / LPF FEW (*)    Bacteria, UA MANY (*)    All other components within normal limits  GLUCOSE, CAPILLARY - Abnormal; Notable for the following:    Glucose-Capillary 378 (*)    All other components within normal limits  CBC  PROTIME-INR  APTT  PROCALCITONIN  TROPONIN I  KETONES, QUALITATIVE  URINE CULTURE  CULTURE, BLOOD (ROUTINE X 2)  CULTURE, BLOOD (ROUTINE X 2)  BLOOD GAS, VENOUS   Ct Head Wo Contrast  05/22/2011  *RADIOLOGY REPORT*  Clinical Data: Seizure  CT HEAD  WITHOUT CONTRAST  Technique:  Contiguous axial images were obtained from the base of the skull through the vertex without contrast.  Comparison: None.  Findings: Moderate ventricular enlargement involving the third and  lateral ventricles.  Hypodensity in the periventricular white matter, this could be due to transependymal resorption of CSF or chronic ischemia.  No acute infarct.  Negative for hemorrhage or mass lesion.  Mild chronic sinusitis.  No acute skull abnormality.  Scaphocephaly of the calvarium is noted.  IMPRESSION: Moderate ventricular enlargement of the third and  lateral ventricles.  This may indicate obstructive hydrocephalous. Communicating hydrocephalus is also possible.  Comparison with prior imaging is suggested to determine stability.  If no prior studies are available, MRI may be helpful.  Original Report Authenticated By: Camelia Phenes, M.D.   Dg Chest Port 1 View  05/22/2011  *RADIOLOGY REPORT*  Clinical Data: Seizure, hyperglycemia  PORTABLE CHEST - 1 VIEW  Comparison: None.  Findings: Cardiomediastinal silhouette is unremarkable. Degenerative changes thoracic spine.  Extensive degenerative changes bilateral shoulders.  No acute infiltrate or pulmonary edema.  No diagnostic pneumothorax.  IMPRESSION: No active disease.  Degenerative changes thoracic spine and bilateral shoulders.  Original Report Authenticated By: Natasha Mead, M.D.     1. Seizure   2. Altered mental status   3. Hyperglycemia without ketosis   4. Cerebral ventriculomegaly       MDM  Newonset seizure with hyperglycemia.  S/p 5 mg versed by EMS.  Postictal but protecting airway. Patient's son confirms that she has had a normal mental status prior to today's activities.  Ventriculomegaly on CT, d/w with Dr. Yetta Barre of neurosurgery.  As patient had normal mental status prior to seizure, he doubts acute hydrocephalus as cause.  He expects patient's mental status to resolve as her post ictal state results. By history  patient has not had prolonged course illness and was normal prior to her seizure today. He doubts patient had acute onset of obstructive hydrocephalus. Agrees with MRI.  Elevated blood sugar with AG 18.  No ketones.  PH normal.  Insulin gtt started.  +UTI.  Rocephin begun. Admission discussed with Dr. Irene Limbo to stepdown. Son wishes mother to be full code.   CRITICAL CARE Performed by: Glynn Octave   Total critical care time: 45  Critical care time was exclusive of separately billable procedures and treating other patients.  Critical care was necessary to treat or prevent imminent or life-threatening deterioration.  Critical care was time spent personally by me on the following activities: development of treatment plan with patient and/or surrogate as well as nursing, discussions with consultants, evaluation of patient's response to treatment, examination of patient, obtaining history from patient or surrogate, ordering and performing treatments and interventions, ordering and review of laboratory studies, ordering and review of radiographic studies, pulse oximetry and re-evaluation of patient's condition.   Date: 05/22/2011  Rate: 102  Rhythm: sinus tachycardia  QRS Axis: normal  Intervals: normal  ST/T Wave abnormalities: normal  Conduction Disutrbances:none  Narrative Interpretation:   Old EKG Reviewed: unchanged    Glynn Octave, MD 05/22/11 1925

## 2011-05-22 NOTE — ED Notes (Signed)
The pt is restless even after the iv ativan

## 2011-05-22 NOTE — ED Provider Notes (Signed)
Date: 05/22/2011  Rate: 102  Rhythm: sinus tachycardia  QRS Axis: normal  Intervals: QT prolonged  ST/T Wave abnormalities: normal  Conduction Disutrbances:none  Narrative Interpretation: Borderline EKG.  Old EKG Reviewed: unchanged    Carleene Cooper III, MD 05/22/11 1517

## 2011-05-22 NOTE — Consult Note (Signed)
Reason for Consult:New onset seizure Referring Physician: Rancour  CC: Seizure  HPI: Kristi Snow is an 78 y.o. female with a history of dementia.  Was noted today around lunchtime to have a generalized seizure.  Has no prior history of seizure.  Patient is a resident at an assisted living facility.  EMS was called and patient was noted to have another seizure in the parking lot.  Patient was given Versed and transported to the ED.  Patient was loaded with Versed.  BP and BS was elevated.  Patient now poorly responsive.  Past Medical History  Diagnosis Date  . Hypertension   . Diabetes mellitus   . Back injury   . Encephalopathy, unspecified   . Urinary tract infection   . Dementia   . Prolapsed uterus   . Lumbar spinal stenosis     Past Surgical History  Procedure Date  . No past surgeries     History reviewed. No pertinent family history.  Social History:  reports that she has never smoked. She does not have any smokeless tobacco history on file. She reports that she does not drink alcohol or use illicit drugs.  No Known Allergies  Medications: I have reviewed the patient's current medications. Prior to Admission:  Tylenol, Pletal, Aricept, Insulin, Isosorbide, Ativan, Melatonin, Norflex, Oxy-IR, Miralax, Sertraline, Simvastin, Verapamil, Vitamin D  ROS: Unable to obtain.  No family in the room.  Physical Examination: Blood pressure 126/102, pulse 103, temperature 98 F (36.7 C), temperature source Rectal, resp. rate 19, SpO2 100.00%.  Neurologic Examination Mental Status: Patient lethargic.  No speech.  Does not follow commands. Cranial Nerves: II: visual fields grossly normal, pupils equal, round, reactive to light and accommodation III,IV, VI: Does not blink to bilateral confrontation.  No Doll's eye response V,VII: Decreased in left NLF.  Weak corneal on the right, absent corneal on the left VIII: unable to be tested IX,X: gag reflex not testedt XI: trapezius  strength/neck flexion strength normal bilaterally XII: unable to be tested Motor: Patient can not cooperate for formal testing but moves all extremities reasonably symmetrically with stimulation Sensory: Patient responds to noxious stimuli in all extremities Deep Tendon Reflexes: 2+ in the upper extremities, absent in the lower extremities Plantars: Right: upgoing   Left: downgoing Cerebellar: Unable to perform   Results for orders placed during the hospital encounter of 05/22/11 (from the past 48 hour(s))  GLUCOSE, CAPILLARY     Status: Abnormal   Collection Time   05/22/11  2:00 PM      Component Value Range Comment   Glucose-Capillary >600 (*) 70 - 99 (mg/dL)    Comment 1 Notify RN      Comment 2 Call MD NNP PA CNM     CBC     Status: Normal   Collection Time   05/22/11  2:54 PM      Component Value Range Comment   WBC 6.5  4.0 - 10.5 (K/uL)    RBC 4.86  3.87 - 5.11 (MIL/uL)    Hemoglobin 14.6  12.0 - 15.0 (g/dL)    HCT 16.1  09.6 - 04.5 (%)    MCV 91.6  78.0 - 100.0 (fL)    MCH 30.0  26.0 - 34.0 (pg)    MCHC 32.8  30.0 - 36.0 (g/dL)    RDW 40.9  81.1 - 91.4 (%)    Platelets 257  150 - 400 (K/uL)   DIFFERENTIAL     Status: Abnormal   Collection Time  05/22/11  2:54 PM      Component Value Range Comment   Neutrophils Relative 82 (*) 43 - 77 (%)    Neutro Abs 5.4  1.7 - 7.7 (K/uL)    Lymphocytes Relative 16  12 - 46 (%)    Lymphs Abs 1.0  0.7 - 4.0 (K/uL)    Monocytes Relative 2 (*) 3 - 12 (%)    Monocytes Absolute 0.1  0.1 - 1.0 (K/uL)    Eosinophils Relative 0  0 - 5 (%)    Eosinophils Absolute 0.0  0.0 - 0.7 (K/uL)    Basophils Relative 0  0 - 1 (%)    Basophils Absolute 0.0  0.0 - 0.1 (K/uL)   COMPREHENSIVE METABOLIC PANEL     Status: Abnormal   Collection Time   05/22/11  2:54 PM      Component Value Range Comment   Sodium 150 (*) 135 - 145 (mEq/L)    Potassium 3.2 (*) 3.5 - 5.1 (mEq/L)    Chloride 109  96 - 112 (mEq/L)    CO2 23  19 - 32 (mEq/L)    Glucose, Bld  693 (*) 70 - 99 (mg/dL)    BUN 25 (*) 6 - 23 (mg/dL)    Creatinine, Ser 1.19  0.50 - 1.10 (mg/dL)    Calcium 14.7  8.4 - 10.5 (mg/dL)    Total Protein 7.7  6.0 - 8.3 (g/dL)    Albumin 3.7  3.5 - 5.2 (g/dL)    AST 13  0 - 37 (U/L)    ALT 13  0 - 35 (U/L)    Alkaline Phosphatase 136 (*) 39 - 117 (U/L)    Total Bilirubin 0.4  0.3 - 1.2 (mg/dL)    GFR calc non Af Amer 53 (*) >90 (mL/min)    GFR calc Af Amer 61 (*) >90 (mL/min)   PROTIME-INR     Status: Normal   Collection Time   05/22/11  2:54 PM      Component Value Range Comment   Prothrombin Time 14.6  11.6 - 15.2 (seconds)    INR 1.12  0.00 - 1.49    APTT     Status: Normal   Collection Time   05/22/11  2:54 PM      Component Value Range Comment   aPTT 31  24 - 37 (seconds)   LACTIC ACID, PLASMA     Status: Abnormal   Collection Time   05/22/11  2:54 PM      Component Value Range Comment   Lactic Acid, Venous 3.5 (*) 0.5 - 2.2 (mmol/L)   TROPONIN I     Status: Normal   Collection Time   05/22/11  2:54 PM      Component Value Range Comment   Troponin I <0.30  <0.30 (ng/mL)   PROCALCITONIN     Status: Normal   Collection Time   05/22/11  2:55 PM      Component Value Range Comment   Procalcitonin <0.10     POCT I-STAT 3, BLOOD GAS (G3P V)     Status: Abnormal   Collection Time   05/22/11  3:22 PM      Component Value Range Comment   pH, Ven 7.322 (*) 7.250 - 7.300     pCO2, Ven 49.7  45.0 - 50.0 (mmHg)    pO2, Ven 38.0  30.0 - 45.0 (mmHg)    Bicarbonate 25.7 (*) 20.0 - 24.0 (mEq/L)    TCO2 27  0 -  100 (mmol/L)    O2 Saturation 66.0      Acid-base deficit 1.0  0.0 - 2.0 (mmol/L)    Sample type VENOUS     GLUCOSE, CAPILLARY     Status: Abnormal   Collection Time   05/22/11  3:52 PM      Component Value Range Comment   Glucose-Capillary 385 (*) 70 - 99 (mg/dL)   URINALYSIS, ROUTINE W REFLEX MICROSCOPIC     Status: Abnormal   Collection Time   05/22/11  4:10 PM      Component Value Range Comment   Color, Urine YELLOW  YELLOW      APPearance CLOUDY (*) CLEAR     Specific Gravity, Urine 1.039 (*) 1.005 - 1.030     pH 5.0  5.0 - 8.0     Glucose, UA >1000 (*) NEGATIVE (mg/dL)    Hgb urine dipstick TRACE (*) NEGATIVE     Bilirubin Urine NEGATIVE  NEGATIVE     Ketones, ur NEGATIVE  NEGATIVE (mg/dL)    Protein, ur NEGATIVE  NEGATIVE (mg/dL)    Urobilinogen, UA 0.2  0.0 - 1.0 (mg/dL)    Nitrite POSITIVE (*) NEGATIVE     Leukocytes, UA NEGATIVE  NEGATIVE    URINE MICROSCOPIC-ADD ON     Status: Abnormal   Collection Time   05/22/11  4:10 PM      Component Value Range Comment   Squamous Epithelial / LPF FEW (*) RARE     WBC, UA 3-6  <3 (WBC/hpf)    RBC / HPF 0-2  <3 (RBC/hpf)    Bacteria, UA MANY (*) RARE     Urine-Other AMORPHOUS URATES/PHOSPHATES     KETONES, QUALITATIVE     Status: Normal   Collection Time   05/22/11  4:47 PM      Component Value Range Comment   Acetone, Bld NEGATIVE  NEGATIVE    GLUCOSE, CAPILLARY     Status: Abnormal   Collection Time   05/22/11  6:17 PM      Component Value Range Comment   Glucose-Capillary 378 (*) 70 - 99 (mg/dL)   GLUCOSE, CAPILLARY     Status: Abnormal   Collection Time   05/22/11  7:56 PM      Component Value Range Comment   Glucose-Capillary 386 (*) 70 - 99 (mg/dL)     No results found for this or any previous visit (from the past 240 hour(s)).  Ct Head Wo Contrast  05/22/2011  *RADIOLOGY REPORT*  Clinical Data: Seizure  CT HEAD WITHOUT CONTRAST  Technique:  Contiguous axial images were obtained from the base of the skull through the vertex without contrast.  Comparison: None.  Findings: Moderate ventricular enlargement involving the third and lateral ventricles.  Hypodensity in the periventricular white matter, this could be due to transependymal resorption of CSF or chronic ischemia.  No acute infarct.  Negative for hemorrhage or mass lesion.  Mild chronic sinusitis.  No acute skull abnormality.  Scaphocephaly of the calvarium is noted.  IMPRESSION: Moderate ventricular  enlargement of the third and  lateral ventricles.  This may indicate obstructive hydrocephalous. Communicating hydrocephalus is also possible.  Comparison with prior imaging is suggested to determine stability.  If no prior studies are available, MRI may be helpful.  Original Report Authenticated By: Camelia Phenes, M.D.   Dg Chest Port 1 View  05/22/2011  *RADIOLOGY REPORT*  Clinical Data: Seizure, hyperglycemia  PORTABLE CHEST - 1 VIEW  Comparison: None.  Findings: Cardiomediastinal  silhouette is unremarkable. Degenerative changes thoracic spine.  Extensive degenerative changes bilateral shoulders.  No acute infiltrate or pulmonary edema.  No diagnostic pneumothorax.  IMPRESSION: No active disease.  Degenerative changes thoracic spine and bilateral shoulders.  Original Report Authenticated By: Natasha Mead, M.D.     Assessment/Plan:  Patient Active Hospital Problem List: New onset seizures   Assessment:  Patient without prior history of seizures.  Does have a history of dementia. BS markedly elevated on presentation.   Other abnormalities include elevated sodium as well.  CT shows enlarged ventricles and small vessel ischemic changes.  Patient has been loaded with Keppra.  Although there is a possibility that the seizures are secondary to her metabolic abnormalities further imaging is necessary to rule out a structural abnormality as the etiology.     Plan: 1.  Continue maintenance Keppra at 500mg  IV q12 hours.  May switch to po once able to restart po medications.  2.  MRI of the brain  3.  Continued addressing of metabolic abnormalities  4.  Seizure precautions.          5.  EEG   Thana Farr, MD [facility] [phone] 05/22/2011, 9:13 PM

## 2011-05-22 NOTE — ED Notes (Signed)
Arm board applied to patient left elbow at 1820, IV infused 300 cc of 2000cc bolus prior to glucose stabilizer start, while attempting to start IV for keppra, pt moved about so much that IV in left AC infiltrated, 2 IV attempts made to restart, pt fighting the entire way, ativan not working

## 2011-05-23 ENCOUNTER — Inpatient Hospital Stay (HOSPITAL_COMMUNITY): Payer: Medicare Other

## 2011-05-23 ENCOUNTER — Other Ambulatory Visit: Payer: Self-pay

## 2011-05-23 DIAGNOSIS — R001 Bradycardia, unspecified: Secondary | ICD-10-CM | POA: Diagnosis not present

## 2011-05-23 DIAGNOSIS — R569 Unspecified convulsions: Secondary | ICD-10-CM

## 2011-05-23 DIAGNOSIS — E1165 Type 2 diabetes mellitus with hyperglycemia: Secondary | ICD-10-CM | POA: Diagnosis present

## 2011-05-23 DIAGNOSIS — I10 Essential (primary) hypertension: Secondary | ICD-10-CM | POA: Diagnosis present

## 2011-05-23 DIAGNOSIS — G9389 Other specified disorders of brain: Secondary | ICD-10-CM | POA: Diagnosis present

## 2011-05-23 DIAGNOSIS — E870 Hyperosmolality and hypernatremia: Secondary | ICD-10-CM | POA: Diagnosis present

## 2011-05-23 DIAGNOSIS — E860 Dehydration: Secondary | ICD-10-CM | POA: Diagnosis present

## 2011-05-23 LAB — GLUCOSE, CAPILLARY
Glucose-Capillary: 134 mg/dL — ABNORMAL HIGH (ref 70–99)
Glucose-Capillary: 256 mg/dL — ABNORMAL HIGH (ref 70–99)
Glucose-Capillary: 119 mg/dL — ABNORMAL HIGH (ref 70–99)

## 2011-05-23 LAB — CBC
HCT: 47.9 % — ABNORMAL HIGH (ref 36.0–46.0)
MCH: 29.6 pg (ref 26.0–34.0)
MCV: 93.4 fL (ref 78.0–100.0)
RDW: 14.5 % (ref 11.5–15.5)
WBC: 10 10*3/uL (ref 4.0–10.5)

## 2011-05-23 LAB — BASIC METABOLIC PANEL
CO2: 23 mEq/L (ref 19–32)
Chloride: 115 mEq/L — ABNORMAL HIGH (ref 96–112)
Creatinine, Ser: 0.7 mg/dL (ref 0.50–1.10)
Glucose, Bld: 297 mg/dL — ABNORMAL HIGH (ref 70–99)

## 2011-05-23 MED ORDER — HYDRALAZINE HCL 20 MG/ML IJ SOLN
10.0000 mg | Freq: Four times a day (QID) | INTRAMUSCULAR | Status: DC | PRN
  Administered 2011-05-23: 10 mg via INTRAVENOUS
  Filled 2011-05-23: qty 0.5
  Filled 2011-05-23: qty 1

## 2011-05-23 MED ORDER — INSULIN ASPART 100 UNIT/ML ~~LOC~~ SOLN
0.0000 [IU] | Freq: Every day | SUBCUTANEOUS | Status: DC
  Administered 2011-05-26: 4 [IU] via SUBCUTANEOUS

## 2011-05-23 MED ORDER — HYDRALAZINE HCL 20 MG/ML IJ SOLN
10.0000 mg | Freq: Four times a day (QID) | INTRAMUSCULAR | Status: DC
  Administered 2011-05-24 (×3): 10 mg via INTRAVENOUS
  Filled 2011-05-23 (×4): qty 0.5
  Filled 2011-05-23: qty 1

## 2011-05-23 MED ORDER — SODIUM CHLORIDE 0.45 % IV SOLN
INTRAVENOUS | Status: DC
  Administered 2011-05-23: 18:00:00 via INTRAVENOUS

## 2011-05-23 MED ORDER — INSULIN ASPART 100 UNIT/ML ~~LOC~~ SOLN: 0.0000 [IU] | Freq: Every day | SUBCUTANEOUS | Status: DC

## 2011-05-23 MED ORDER — AMLODIPINE BESYLATE 5 MG PO TABS
5.0000 mg | ORAL_TABLET | Freq: Every day | ORAL | Status: DC
  Filled 2011-05-23: qty 1

## 2011-05-23 MED ORDER — METOPROLOL TARTRATE 1 MG/ML IV SOLN
5.0000 mg | Freq: Four times a day (QID) | INTRAVENOUS | Status: DC
  Administered 2011-05-23: 5 mg via INTRAVENOUS
  Filled 2011-05-23: qty 5

## 2011-05-23 MED ORDER — INSULIN ASPART 100 UNIT/ML ~~LOC~~ SOLN
0.0000 [IU] | Freq: Three times a day (TID) | SUBCUTANEOUS | Status: DC
  Administered 2011-05-23: 3 [IU] via SUBCUTANEOUS
  Administered 2011-05-24 (×2): 7 [IU] via SUBCUTANEOUS
  Administered 2011-05-24: 11 [IU] via SUBCUTANEOUS
  Administered 2011-05-25: 4 [IU] via SUBCUTANEOUS
  Administered 2011-05-25 (×2): 11 [IU] via SUBCUTANEOUS

## 2011-05-23 MED ORDER — INSULIN GLARGINE 100 UNIT/ML ~~LOC~~ SOLN
10.0000 [IU] | Freq: Every day | SUBCUTANEOUS | Status: DC
  Administered 2011-05-23: 10 [IU] via SUBCUTANEOUS

## 2011-05-23 MED ORDER — INSULIN GLARGINE 100 UNIT/ML ~~LOC~~ SOLN: 10.0000 [IU] | Freq: Every day | SUBCUTANEOUS | Status: DC

## 2011-05-23 MED ORDER — ENSURE COMPLETE PO LIQD
237.0000 mL | Freq: Two times a day (BID) | ORAL | Status: DC
Start: 2011-05-23 — End: 2011-05-23

## 2011-05-23 MED ORDER — INSULIN ASPART 100 UNIT/ML ~~LOC~~ SOLN
0.0000 [IU] | Freq: Three times a day (TID) | SUBCUTANEOUS | Status: DC
  Administered 2011-05-23: 5 [IU] via SUBCUTANEOUS
  Administered 2011-05-23: 8 [IU] via SUBCUTANEOUS

## 2011-05-23 MED ORDER — DEXTROSE 5 % IV SOLN
1.0000 g | INTRAVENOUS | Status: DC
  Administered 2011-05-23 – 2011-05-26 (×4): 1 g via INTRAVENOUS
  Filled 2011-05-23 (×6): qty 10

## 2011-05-23 MED ORDER — LORAZEPAM 2 MG/ML IJ SOLN
1.0000 mg | Freq: Once | INTRAMUSCULAR | Status: AC
  Administered 2011-05-23: 1 mg via INTRAVENOUS

## 2011-05-23 NOTE — Progress Notes (Signed)
Thank you to Garey Ham RN Diabetes Coordinator for this referral.  It is noted in the H&P that the patient resides at [facility].  This is an ALF and in conjunction with her advanced dementia she would not appropriate for our services. For any additional questions or new referrals please contact Anibal Henderson BSN RN [facility] Liaison at [phone].

## 2011-05-23 NOTE — Progress Notes (Signed)
INITIAL ADULT NUTRITION ASSESSMENT Date: 05/23/2011   Time: 3:17 PM  Reason for Assessment: Low Braden  ASSESSMENT: Female 78 y.o.  Dx: New onset seizure  Hx:  Past Medical History  Diagnosis Date  . Hypertension   . Diabetes mellitus   . Back injury   . Encephalopathy, unspecified   . Urinary tract infection   . Dementia   . Prolapsed uterus   . Lumbar spinal stenosis     Related Meds:  Scheduled Meds:   . sodium chloride   Intravenous Once  . amLODipine  5 mg Oral Daily  . cefTRIAXone (ROCEPHIN)  IV  1 g Intravenous Once  . insulin aspart  0-20 Units Subcutaneous TID WC  . insulin aspart  0-5 Units Subcutaneous QHS  . insulin glargine  10 Units Subcutaneous QHS  . levetiracetam  1,000 mg Intravenous To Major  . levetiracetam  500 mg Intravenous Q12H  . LORazepam  1 mg Intravenous Once  . LORazepam  1 mg Intravenous Once  . LORazepam  1 mg Intravenous Once  . potassium chloride  10 mEq Intravenous Q1 Hr x 4  . sodium chloride  3 mL Intravenous Q12H  . DISCONTD: insulin aspart  0-15 Units Subcutaneous TID WC  . DISCONTD: insulin aspart  0-5 Units Subcutaneous QHS  . DISCONTD: insulin glargine  10 Units Subcutaneous QHS  . DISCONTD: insulin regular  0-10 Units Intravenous TID WC  . DISCONTD: metoprolol  5 mg Intravenous Q6H   Continuous Infusions:   . dextrose 5 % and 0.45% NaCl 100 mL/hr at 05/23/11 1500  . DISCONTD: sodium chloride 125 mL/hr at 05/22/11 2300  . DISCONTD: insulin (NOVOLIN-R) infusion 3.3 Units/hr (05/22/11 2054)  . DISCONTD: insulin (NOVOLIN-R) infusion Stopped (05/23/11 0357)   PRN Meds:.acetaminophen, acetaminophen, dextrose, LORazepam, ondansetron (ZOFRAN) IV, ondansetron   Ht:  5\' 5"  (165.1 cm)  Ht Readings from Last 15 Encounters:  03/07/11 5\' 5"  (1.651 m)  11/08/10 5' 0.5" (1.537 m)   Wt: 129 lb 10.1 oz (58.8 kg)  Wt Readings from Last 15 Encounters:  05/22/11 129 lb 10.1 oz (58.8 kg)  03/09/11 151 lb 3.8 oz (68.6 kg)  11/08/10  165 lb (74.844 kg)   Ideal Wt: 56.8 kg % Ideal Wt: 104%  Usual Wt: 151 lb (2.5 months ago); 165 lb (6.5 months ago) % Usual Wt: 78-85%  BMI=21.6  Food/Nutrition Related Hx: No nutrition problems identified on admission nutrition screen.  15-22% weight loss over the past 2.5-6.5 months.  Labs:  CMP     Component Value Date/Time   NA 150* 05/23/2011 0854   K 4.4 05/23/2011 0854   CL 115* 05/23/2011 0854   CO2 23 05/23/2011 0854   GLUCOSE 297* 05/23/2011 0854   BUN 18 05/23/2011 0854   CREATININE 0.70 05/23/2011 0854   CALCIUM 9.6 05/23/2011 0854   PROT 7.7 05/22/2011 1454   ALBUMIN 3.7 05/22/2011 1454   AST 13 05/22/2011 1454   ALT 13 05/22/2011 1454   ALKPHOS 136* 05/22/2011 1454   BILITOT 0.4 05/22/2011 1454   GFRNONAA 77* 05/23/2011 0854   GFRAA 90* 05/23/2011 0854    CBG (last 3)   Basename 05/23/11 1304 05/23/11 0901 05/23/11 0356  GLUCAP 215* 256* 134*     Intake/Output Summary (Last 24 hours) at 05/23/11 1525 Last data filed at 05/23/11 0934  Gross per 24 hour  Intake    705 ml  Output      0 ml  Net    705 ml  Diet Order: CHO-modified medium  Supplements/Tube Feeding: None  IVF:    dextrose 5 % and 0.45% NaCl Last Rate: 50 mL/hr at 05/23/11 1300  DISCONTD: sodium chloride Last Rate: 125 mL/hr at 05/22/11 2300  DISCONTD: insulin (NOVOLIN-R) infusion Last Rate: 3.3 Units/hr (05/22/11 2054)  DISCONTD: insulin (NOVOLIN-R) infusion Last Rate: Stopped (05/23/11 0357)    Estimated Nutritional Needs:   Kcal: 1450-1650 Protein: 70-85 grams Fluid: 1.5-1.7 liters  Patient unable to answer questions.  Not eating due to increased lethargy and decreased alertness per RN.  Patient with severe malnutrition in the context of chronic illness given 22% weight loss in 6.5 months and severe muscle loss (temples and clavicles).    NUTRITION DIAGNOSIS: -Inadequate oral intake (NI-2.1).  Status: Ongoing  RELATED TO: decreased alertness  AS EVIDENCED BY: 0% meal  intake  MONITORING/EVALUATION(Goals): Goal:  Intake to meet >90% of estimated nutrition needs to prevent further depletion of lean body mass. Monitor:  PO intake, labs, weight trend.  EDUCATION NEEDS: -Education not appropriate at this time  INTERVENTION: Ensure Complete BID (when more alert) to maximize oral intake.  Dietitian #:  [phone]  DOCUMENTATION CODES Per approved criteria  -Severe malnutrition in the context of chronic illness    Kristi Snow 05/23/2011, 3:17 PM

## 2011-05-23 NOTE — Progress Notes (Signed)
[facility] PROGRESS NOTE    SUBJECTIVE   Just returned from MRI. While in MRI received 2 mg Ativan for agitation and now very sedated.   OBJECTIVE   Neurologic Exam:  Mental Status: Will not respond to her name called but quickly withdraws when touched and becomes agitated when exam is being administered.  Cranial Nerves: II-Visual fields grossly intact. III/IV/VI-dolls positive and  Pupils reactive bilaterally. V/VII-no facial asymmetry noted VIII-grossly intact IX/X-normal gag XI-bilateral shoulder shrug XII-midline tongue extension Motor: withdraws bilateral extremities briskly Sensory: Pinprick and light touch intact throughout, bilaterally Deep Tendon Reflexes: 1+ and symmetric UE with minimal in LE bilaterally Plantars: right upgoing and left downgoing  Studies/Results: Ct Head Wo Contrast  05/22/2011  *RADIOLOGY REPORT*  Clinical Data: Seizure  CT HEAD WITHOUT CONTRAST  Technique:  Contiguous axial images were obtained from the base of the skull through the vertex without contrast.  Comparison: None.  Findings: Moderate ventricular enlargement involving the third and lateral ventricles.  Hypodensity in the periventricular white matter, this could be due to transependymal resorption of CSF or chronic ischemia.  No acute infarct.  Negative for hemorrhage or mass lesion.  Mild chronic sinusitis.  No acute skull abnormality.  Scaphocephaly of the calvarium is noted.  IMPRESSION: Moderate ventricular enlargement of the third and  lateral ventricles.  This may indicate obstructive hydrocephalous. Communicating hydrocephalus is also possible.  Comparison with prior imaging is suggested to determine stability.  If no prior studies are available, MRI may be helpful.  Original Report Authenticated By: Camelia Phenes, M.D.   Mr Brain Wo Contrast  05/23/2011  *RADIOLOGY REPORT*  Clinical Data: New onset seizure.  The examination had to be  discontinued prior to completion due to patient refused further imaging.  She had been given ativan twice.  MRI HEAD WITHOUT CONTRAST  Technique:  Multiplanar, multiecho pulse sequences of the brain and surrounding structures were obtained according to standard protocol without intravenous contrast.  Comparison: CT head without contrast 05/22/2011.  Findings: The diffusion weighted images demonstrate no evidence for acute or subacute infarction.  Ventricular enlargement is again noted.  No obstructing lesion is evident.  There is some patient motion and it is difficult to know whether the aqueduct of Sylvius is patent.  Moderate periventricular and scattered subcortical T2 and FLAIR hyperintensities are present bilaterally.  Moderate generalized atrophy is present.  Flow is present in the major intracranial arteries.  The patient is status post bilateral lens extractions.  The globes and orbits are otherwise intact.  Scattered mucosal thickening is present throughout the paranasal sinuses.  The mastoid air cells are clear.  IMPRESSION:  1.  No acute or focal lesion to account for the patient's seizure. 2.  Mild ventricular enlargement.  This may represent a normal pressure hydrocephalus.  A fair amount of atrophy is present as well.  It is unclear hydrocephalus is evident.  No obstructing lesion is present. 3.  Moderate atrophy white matter disease.  This likely reflects the sequelae of chronic microvascular ischemia. 4.  Mild mucosal thickening throughout the paranasal sinuses.  No fluid is present.  Original Report Authenticated By: Jamesetta Orleans. MATTERN, M.D.   Dg Chest Port 1 View  05/22/2011  *RADIOLOGY REPORT*  Clinical Data: Seizure, hyperglycemia  PORTABLE CHEST -  1 VIEW  Comparison: None.  Findings: Cardiomediastinal silhouette is unremarkable. Degenerative changes thoracic spine.  Extensive degenerative changes bilateral shoulders.  No acute infiltrate or pulmonary edema.  No diagnostic pneumothorax.   IMPRESSION: No active disease.  Degenerative changes thoracic spine and bilateral shoulders.  Original Report Authenticated By: Natasha Mead, M.D.    Medications:     Scheduled:   . sodium chloride   Intravenous Once  . cefTRIAXone (ROCEPHIN)  IV  1 g Intravenous Once  . insulin aspart  0-15 Units Subcutaneous TID WC  . insulin aspart  0-5 Units Subcutaneous QHS  . insulin aspart  10 Units Intravenous Once  . insulin glargine  10 Units Subcutaneous QHS  . insulin regular  0-10 Units Intravenous TID WC  . levetiracetam  1,000 mg Intravenous To Major  . levetiracetam  500 mg Intravenous Q12H  . LORazepam  1 mg Intravenous Once  . LORazepam  1 mg Intravenous Once  . LORazepam  1 mg Intravenous Once  . metoprolol  5 mg Intravenous Q6H  . potassium chloride  10 mEq Intravenous Q1 Hr x 4  . sodium chloride  3 mL Intravenous Q12H  . DISCONTD: insulin glargine  10 Units Subcutaneous QHS    Assessment/Plan:   78 YO female with history of seizure and dementia. Presented to ED with elevated BS and seizure activity.  Patient has had no recurrent seizures, currently on Keppra 500 mg BID.  Recently returned from MRI which showed no acute infarct.  Now sedated due to 2 mg Ativan administered for MRI.   Recommend:  1)Continue maintenance Keppra at 500mg  IV q12 hours. May switch to po once able to restart po medications 2)Continued addressing of metabolic abnormalities 3) Seizure precautions.  4) EEG    Felicie Morn PA-C [facility] [phone]  05/23/2011, 9:55 AM

## 2011-05-23 NOTE — Progress Notes (Signed)
Portable EEG w/ video completed. 

## 2011-05-23 NOTE — Progress Notes (Signed)
Utilization Review Completed.Hortencia Martire T4/09/2011   

## 2011-05-23 NOTE — Progress Notes (Signed)
Patient ID: Kristi Snow, female   DOB: August 24, 1924, 78 y.o.   MRN: [id number] Subjective: Patient remains sedated, did receive ativan for MRI.  Objective: Vital signs in last 24 hours: Temp:  [97.4 F (36.3 C)-98.8 F (37.1 C)] 97.8 F (36.6 C) (04/08 1600) Pulse Rate:  [36-121] 40  (04/08 1800) Resp:  [12-19] 17  (04/08 1800) BP: (154-186)/(54-106) 186/106 mmHg (04/08 1647) SpO2:  [82 %-100 %] 98 % (04/08 1800) Weight:  [58.8 kg (129 lb 10.1 oz)] 58.8 kg (129 lb 10.1 oz) (04/07 2148)  Intake/Output from previous day: 04/07 0701 - 04/08 0700 In: 600 [I.V.:300; IV Piggyback:300] Out: -  Intake/Output this shift:    Remains somnolent, locallizes.  Lab Results: Lab Results  Component Value Date   WBC 10.0 05/23/2011   HGB 15.2* 05/23/2011   HCT 47.9* 05/23/2011   MCV 93.4 05/23/2011   PLT 226 05/23/2011   Lab Results  Component Value Date   INR 1.12 05/22/2011   BMET Lab Results  Component Value Date   NA 150* 05/23/2011   K 4.4 05/23/2011   CL 115* 05/23/2011   CO2 23 05/23/2011   GLUCOSE 297* 05/23/2011   BUN 18 05/23/2011   CREATININE 0.70 05/23/2011   CALCIUM 9.6 05/23/2011    Studies/Results: Ct Head Wo Contrast  05/22/2011  *RADIOLOGY REPORT*  Clinical Data: Seizure  CT HEAD WITHOUT CONTRAST  Technique:  Contiguous axial images were obtained from the base of the skull through the vertex without contrast.  Comparison: None.  Findings: Moderate ventricular enlargement involving the third and lateral ventricles.  Hypodensity in the periventricular white matter, this could be due to transependymal resorption of CSF or chronic ischemia.  No acute infarct.  Negative for hemorrhage or mass lesion.  Mild chronic sinusitis.  No acute skull abnormality.  Scaphocephaly of the calvarium is noted.  IMPRESSION: Moderate ventricular enlargement of the third and  lateral ventricles.  This may indicate obstructive hydrocephalous. Communicating hydrocephalus is also possible.  Comparison with prior imaging is  suggested to determine stability.  If no prior studies are available, MRI may be helpful.  Original Report Authenticated By: Camelia Phenes, M.D.   Mr Brain Wo Contrast  05/23/2011  *RADIOLOGY REPORT*  Clinical Data: New onset seizure.  The examination had to be discontinued prior to completion due to patient refused further imaging.  She had been given ativan twice.  MRI HEAD WITHOUT CONTRAST  Technique:  Multiplanar, multiecho pulse sequences of the brain and surrounding structures were obtained according to standard protocol without intravenous contrast.  Comparison: CT head without contrast 05/22/2011.  Findings: The diffusion weighted images demonstrate no evidence for acute or subacute infarction.  Ventricular enlargement is again noted.  No obstructing lesion is evident.  There is some patient motion and it is difficult to know whether the aqueduct of Sylvius is patent.  Moderate periventricular and scattered subcortical T2 and FLAIR hyperintensities are present bilaterally.  Moderate generalized atrophy is present.  Flow is present in the major intracranial arteries.  The patient is status post bilateral lens extractions.  The globes and orbits are otherwise intact.  Scattered mucosal thickening is present throughout the paranasal sinuses.  The mastoid air cells are clear.  IMPRESSION:  1.  No acute or focal lesion to account for the patient's seizure. 2.  Mild ventricular enlargement.  This may represent a normal pressure hydrocephalus.  A fair amount of atrophy is present as well.  It is unclear hydrocephalus is evident.  No  obstructing lesion is present. 3.  Moderate atrophy white matter disease.  This likely reflects the sequelae of chronic microvascular ischemia. 4.  Mild mucosal thickening throughout the paranasal sinuses.  No fluid is present.  Original Report Authenticated By: Jamesetta Orleans. MATTERN, M.D.   Dg Chest Port 1 View  05/22/2011  *RADIOLOGY REPORT*  Clinical Data: Seizure,  hyperglycemia  PORTABLE CHEST - 1 VIEW  Comparison: None.  Findings: Cardiomediastinal silhouette is unremarkable. Degenerative changes thoracic spine.  Extensive degenerative changes bilateral shoulders.  No acute infiltrate or pulmonary edema.  No diagnostic pneumothorax.  IMPRESSION: No active disease.  Degenerative changes thoracic spine and bilateral shoulders.  Original Report Authenticated By: Natasha Mead, M.D.    Assessment/Plan: Unclear cause of seizure and somnolence (Sz, UTI, benzos, keppra, Na, HCP, other?). Considering LP. MRI not that impressive in regards to concerns for acute obstructive HCP, and her lack of progressive MS decline requiring intubation is also not suggestive of typical decline from HCP. Na still 150, and my personal preference would be to empirically treat the bacteria in her urine given the current situation and how UTIs can effect the elderly.     LOS: 1 day    Carliyah Cotterman S 05/23/2011, 7:10 PM

## 2011-05-23 NOTE — Progress Notes (Signed)
Inpatient Diabetes Program Recommendations  AACE/ADA: New Consensus Statement on Inpatient Glycemic Control  Target Ranges:  Prepandial:   less than 140 mg/dL      Peak postprandial:   less than 180 mg/dL (1-2 hours)      Critically ill patients:  140 - 180 mg/dL  Pager:  [phone] Hours:  8 am-10pm   Reason for Visit: History of DM and on NPH 20 units daily at home  Inpatient Diabetes Program Recommendations Insulin - IV drip/GlucoStabilizer: D/C IV insulin meal coverage:  No longer on IV insulin gtt Insulin - Basal: Increase Lantus to 16 units qhs:  Equivalent to home NPH dose  Note: Referral made to [employer].

## 2011-05-23 NOTE — Progress Notes (Signed)
[facility] [place] TEAM 1 - Stepdown/ICU TEAM  Subjective: Arousable and restless. Nonverbal. Does not follow simple commands. The family is at the bedside.  Objective: Blood pressure 186/106, pulse 40, temperature 97.8 F (36.6 C), temperature source Axillary, resp. rate 15, height 5\' 6"  (1.676 m), weight 58.8 kg (129 lb 10.1 oz), SpO2 98.00%.  General appearance: appears older than stated age, no distress and slowed mentation Resp: clear to auscultation bilaterally, 2 L nasal cannula oxygen Cardio: regular rate and rhythm, S1, S2 normal, no murmur, click, rub or gallop GI: soft, non-tender; bowel sounds normal; no masses,  no organomegaly Extremities: extremities normal, atraumatic, no cyanosis or edema Neurologic: Lethargic after receiving Ativan in MRI, when stimulated becomes restless, does not open eyes or follow simple commands, no seizures seen at this time  Lab Results:  Basename 05/23/11 0854 05/22/11 1454  WBC 10.0 6.5  HGB 15.2* 14.6  HCT 47.9* 44.5  PLT 226 257   BMET  Basename 05/23/11 0854 05/22/11 1454  NA 150* 150*  K 4.4 3.2*  CL 115* 109  CO2 23 23  GLUCOSE 297* 693*  BUN 18 25*  CREATININE 0.70 0.96  CALCIUM 9.6 10.2    Studies/Results: Ct Head Wo Contrast  05/22/2011  *RADIOLOGY REPORT*  Clinical Data: Seizure  CT HEAD WITHOUT CONTRAST  Technique:  Contiguous axial images were obtained from the base of the skull through the vertex without contrast.  Comparison: None.  Findings: Moderate ventricular enlargement involving the third and lateral ventricles.  Hypodensity in the periventricular white matter, this could be due to transependymal resorption of CSF or chronic ischemia.  No acute infarct.  Negative for hemorrhage or mass lesion.  Mild chronic sinusitis.  No acute skull abnormality.  Scaphocephaly of the calvarium is noted.  IMPRESSION: Moderate ventricular enlargement of the third and  lateral ventricles.  This may indicate obstructive  hydrocephalous. Communicating hydrocephalus is also possible.  Comparison with prior imaging is suggested to determine stability.  If no prior studies are available, MRI may be helpful.  Original Report Authenticated By: Camelia Phenes, M.D.   Mr Brain Wo Contrast  05/23/2011  *RADIOLOGY REPORT*  Clinical Data: New onset seizure.  The examination had to be discontinued prior to completion due to patient refused further imaging.  She had been given ativan twice.  MRI HEAD WITHOUT CONTRAST  Technique:  Multiplanar, multiecho pulse sequences of the brain and surrounding structures were obtained according to standard protocol without intravenous contrast.  Comparison: CT head without contrast 05/22/2011.  Findings: The diffusion weighted images demonstrate no evidence for acute or subacute infarction.  Ventricular enlargement is again noted.  No obstructing lesion is evident.  There is some patient motion and it is difficult to know whether the aqueduct of Sylvius is patent.  Moderate periventricular and scattered subcortical T2 and FLAIR hyperintensities are present bilaterally.  Moderate generalized atrophy is present.  Flow is present in the major intracranial arteries.  The patient is status post bilateral lens extractions.  The globes and orbits are otherwise intact.  Scattered mucosal thickening is present throughout the paranasal sinuses.  The mastoid air cells are clear.  IMPRESSION:  1.  No acute or focal lesion to account for the patient's seizure. 2.  Mild ventricular enlargement.  This may represent a normal pressure hydrocephalus.  A fair amount of atrophy is present as well.  It is unclear hydrocephalus is evident.  No obstructing lesion is present. 3.  Moderate atrophy white matter disease.  This likely reflects the sequelae of chronic microvascular ischemia. 4.  Mild mucosal thickening throughout the paranasal sinuses.  No fluid is present.  Original Report Authenticated By: Jamesetta Orleans. MATTERN,  M.D.   Dg Chest Port 1 View  05/22/2011  *RADIOLOGY REPORT*  Clinical Data: Seizure, hyperglycemia  PORTABLE CHEST - 1 VIEW  Comparison: None.  Findings: Cardiomediastinal silhouette is unremarkable. Degenerative changes thoracic spine.  Extensive degenerative changes bilateral shoulders.  No acute infiltrate or pulmonary edema.  No diagnostic pneumothorax.  IMPRESSION: No active disease.  Degenerative changes thoracic spine and bilateral shoulders.  Original Report Authenticated By: Natasha Mead, M.D.    Medications:  I have reviewed the patient's current medications.  Assessment/Plan:  New onset seizure/ Cerebral ventriculomegaly *Appreciate neurology and neurosurgery assistant *Unclear if ventriculomegaly contributing-incidental finding on CT of the head yesterday *MRI of brain today shows mild ventricular enlargement that may be consistent with normal pressure hydrocephalus in addition there is a fair amount of atrophy present *Continue Keppra  Acute hypernatremia/Dehydration *Sodium remains elevated since admission and could be contributing to altered mentation *Hemoglobin 15 and appears to be evidence of hemoconcentration *Continue D5 one half normal saline but will increase rate from 50->100 cc per hour *Follow electrolytes *accurate intake and output  Acute hypoxic respiratory failure *Stable O2 saturations 100% *Chest x-ray negative so suspect hypoxemia directly related to postictal phase after seizures yesterday  UTI (lower urinary tract infection) *No leukocytosis but urinalysis is abnormal *Follow up on cultures *No indication for empiric antibiotics at this time  Hypertension/ Bradycardia *Poorly controlled *Beta blocker stopped secondary to bradycardia *Not alert enough to swallow pills so previously ordered Norvasc has been discontinued in favor of when necessary IV hydralazine.  Diabetes mellitus *CBG greater than 200 *Continue sliding scale insulin with long  acting nocturnal insulin *We'll increase sliding scale to resistance since we are increasing dextrose IV fluid rate  Dementia with acute metabolic encephalopathy *Resident of assisted living facility *When more awake will need formal PT and OT evaluation-may need swallowing evaluation as well  Sebaceous cysts of chest *These appear to be chronic and skin is without erythema or cellulitic changes *In patient or family desires these can undergo incision and drainage in the outpatient setting   Disposition *Remaining stepdown   LOS: 1 day   Junious Silk, ANP pager [phone]  [facility] [email or web address] Password: [email or web address]  05/23/2011, 2:10 PM  I have personally examined this patient and reviewed the entire database. I have reviewed the above note, made any necessary editorial changes, and agree with its content.  Lonia Blood, MD [facility]

## 2011-05-24 LAB — URINE CULTURE

## 2011-05-24 LAB — GLUCOSE, CAPILLARY: Glucose-Capillary: 207 mg/dL — ABNORMAL HIGH (ref 70–99)

## 2011-05-24 LAB — CBC
Hemoglobin: 14.4 g/dL (ref 12.0–15.0)
MCV: 93.8 fL (ref 78.0–100.0)
Platelets: 202 10*3/uL (ref 150–400)
RBC: 5.02 MIL/uL (ref 3.87–5.11)
WBC: 6.9 10*3/uL (ref 4.0–10.5)

## 2011-05-24 LAB — BASIC METABOLIC PANEL
CO2: 26 mEq/L (ref 19–32)
Calcium: 9.8 mg/dL (ref 8.4–10.5)
Chloride: 116 mEq/L — ABNORMAL HIGH (ref 96–112)
Glucose, Bld: 212 mg/dL — ABNORMAL HIGH (ref 70–99)
Potassium: 4 mEq/L (ref 3.5–5.1)
Sodium: 154 mEq/L — ABNORMAL HIGH (ref 135–145)

## 2011-05-24 MED ORDER — DEXTROSE 5 % IV SOLN: 1.0000 g | INTRAVENOUS | Status: DC

## 2011-05-24 MED ORDER — INSULIN GLARGINE 100 UNIT/ML ~~LOC~~ SOLN
20.0000 [IU] | Freq: Every day | SUBCUTANEOUS | Status: DC
  Administered 2011-05-24 – 2011-05-26 (×3): 20 [IU] via SUBCUTANEOUS

## 2011-05-24 MED ORDER — DEXTROSE 5 % IV SOLN
INTRAVENOUS | Status: DC
  Administered 2011-05-24: 10:00:00 via INTRAVENOUS

## 2011-05-24 MED ORDER — LEVETIRACETAM 500 MG PO TABS
500.0000 mg | ORAL_TABLET | Freq: Two times a day (BID) | ORAL | Status: DC
  Administered 2011-05-24 – 2011-05-27 (×6): 500 mg via ORAL
  Filled 2011-05-24 (×8): qty 1

## 2011-05-24 MED ORDER — ISOSORBIDE MONONITRATE 20 MG PO TABS
20.0000 mg | ORAL_TABLET | Freq: Two times a day (BID) | ORAL | Status: DC
  Administered 2011-05-24 – 2011-05-27 (×6): 20 mg via ORAL
  Filled 2011-05-24 (×7): qty 1

## 2011-05-24 NOTE — Progress Notes (Signed)
[facility] [place] TEAM 1 - Stepdown/ICU TEAM  Subjective: Son is at the bedside. He is updated as the patient mental status. Currently the patient is much more arousable than yesterday and is appropriate with verbal conversation interaction but she remains lethargic and drifts off to sleep easily. The son reports this is not the patient's baseline mental status.  Objective: Blood pressure 131/62, pulse 86, temperature 97.7 F (36.5 C), temperature source Axillary, resp. rate 14, height 5\' 6"  (1.676 m), weight 58.8 kg (129 lb 10.1 oz), SpO2 100.00%.  General appearance: appears older than stated age, no distress and slowed mentation Resp: clear to auscultation bilaterally, 2 L nasal cannula oxygen Cardio: regular rate and rhythm, S1, S2 normal, no murmur, click, rub or gallop GI: soft, non-tender; bowel sounds normal; no masses,  no organomegaly Extremities: extremities normal, atraumatic, no cyanosis or edema Neurologic: remains lethargic but is arousable, when aroused is conversant and somewhat oriented. Exam otherwise nonfocal  Lab Results:  Basename 05/24/11 0431 05/23/11 0854  WBC 6.9 10.0  HGB 14.4 15.2*  HCT 47.1* 47.9*  PLT 202 226   BMET  Basename 05/24/11 0431 05/23/11 0854  NA 154* 150*  K 4.0 4.4  CL 116* 115*  CO2 26 23  GLUCOSE 212* 297*  BUN 15 18  CREATININE 0.74 0.70  CALCIUM 9.8 9.6    Studies/Results: Ct Head Wo Contrast  05/22/2011  *RADIOLOGY REPORT*  Clinical Data: Seizure  CT HEAD WITHOUT CONTRAST  Technique:  Contiguous axial images were obtained from the base of the skull through the vertex without contrast.  Comparison: None.  Findings: Moderate ventricular enlargement involving the third and lateral ventricles.  Hypodensity in the periventricular white matter, this could be due to transependymal resorption of CSF or chronic ischemia.  No acute infarct.  Negative for hemorrhage or mass lesion.  Mild chronic sinusitis.  No acute skull  abnormality.  Scaphocephaly of the calvarium is noted.  IMPRESSION: Moderate ventricular enlargement of the third and  lateral ventricles.  This may indicate obstructive hydrocephalous. Communicating hydrocephalus is also possible.  Comparison with prior imaging is suggested to determine stability.  If no prior studies are available, MRI may be helpful.  Original Report Authenticated By: Camelia Phenes, M.D.   Mr Brain Wo Contrast  05/23/2011  *RADIOLOGY REPORT*  Clinical Data: New onset seizure.  The examination had to be discontinued prior to completion due to patient refused further imaging.  She had been given ativan twice.  MRI HEAD WITHOUT CONTRAST  Technique:  Multiplanar, multiecho pulse sequences of the brain and surrounding structures were obtained according to standard protocol without intravenous contrast.  Comparison: CT head without contrast 05/22/2011.  Findings: The diffusion weighted images demonstrate no evidence for acute or subacute infarction.  Ventricular enlargement is again noted.  No obstructing lesion is evident.  There is some patient motion and it is difficult to know whether the aqueduct of Sylvius is patent.  Moderate periventricular and scattered subcortical T2 and FLAIR hyperintensities are present bilaterally.  Moderate generalized atrophy is present.  Flow is present in the major intracranial arteries.  The patient is status post bilateral lens extractions.  The globes and orbits are otherwise intact.  Scattered mucosal thickening is present throughout the paranasal sinuses.  The mastoid air cells are clear.  IMPRESSION:  1.  No acute or focal lesion to account for the patient's seizure. 2.  Mild ventricular enlargement.  This may represent a normal pressure hydrocephalus.  A fair amount  of atrophy is present as well.  It is unclear hydrocephalus is evident.  No obstructing lesion is present. 3.  Moderate atrophy white matter disease.  This likely reflects the sequelae of chronic  microvascular ischemia. 4.  Mild mucosal thickening throughout the paranasal sinuses.  No fluid is present.  Original Report Authenticated By: Jamesetta Orleans. MATTERN, M.D.   Dg Chest Port 1 View  05/22/2011  *RADIOLOGY REPORT*  Clinical Data: Seizure, hyperglycemia  PORTABLE CHEST - 1 VIEW  Comparison: None.  Findings: Cardiomediastinal silhouette is unremarkable. Degenerative changes thoracic spine.  Extensive degenerative changes bilateral shoulders.  No acute infiltrate or pulmonary edema.  No diagnostic pneumothorax.  IMPRESSION: No active disease.  Degenerative changes thoracic spine and bilateral shoulders.  Original Report Authenticated By: Natasha Mead, M.D.    Medications:  I have reviewed the patient's current medications.  Assessment/Plan:  New onset seizure/ Cerebral ventriculomegaly *Appreciate neurology and neurosurgery assistant *MRI of brain showsed mild ventricular enlargement that may be consistent with normal pressure hydrocephalus in addition there is a fair amount of atrophy present.neurosurgery documents that does not have rapidly progressive HCP and at this point no indications for neurosurgical intervention *Continue Keppra  Acute hypernatremia/Dehydration *Sodium remains elevated since admission and could be contributing to altered mentation *Hemoglobin was 15 consistent with hemoconcentration-Trend is downward after hydration initiated *sodium has increased overnight to 154 - we will change from 1/2 NS to dextrose only solution *Follow electrolytes *accurate intake and output  Acute hypoxic respiratory failure *Stable O2 saturations 100% *Chest x-ray negative so suspect hypoxemia directly related to postictal phase after seizures   Escherichia coli UTI (lower urinary tract infection) *greater than 100,000 colonies of Escherichia coli seen -  sensitivities are pending *received a dose of Rocephin in the emergency department at presentation and Rocephin was continued  after admission today is day #3  Hypertension/ Bradycardia *better controlled with addition of scheduled hydralazine *IV Beta blocker stopped secondary to bradycardia-was on Verapamil and Imdur prior to presentation *will transition IV Hydralazine to Imdur  Diabetes mellitus *CBG greater than 200- sugars will be higher with D5 now Was on NPH at nursing home Now on lantus and resistant sliding scale.  Will increase Lantus to 20 today.   Dementia with acute metabolic encephalopathy *Resident of assisted living facility-her baseline mental status is at a person who is alert, conversant and mildly disoriented but is able to ambulate with a walker *out of bed to chair 3 times a day and we have asked PT and OT to evaluate the patient  Sebaceous cysts of chest *These appear to be chronic and skin is without erythema or cellulitic changes   Disposition *Remain in stepdown   LOS: 2 days   Junious Silk, ANP pager [phone]  [facility] [email or web address] Password: [email or web address]  05/24/2011, 11:26 AM   I have examined the patient, reviewed the chart and modified the above note which I agree with.   Calvert Cantor, MD [phone]

## 2011-05-24 NOTE — Progress Notes (Signed)
Subjective: Patient indicates she is in no distress and has no pain. No recurrence of seizure activity. Objective: Current vital signs: BP 135/52  Pulse 103  Temp(Src) 98.2 F (36.8 C) (Axillary)  Resp 20  Ht 5\' 6"  (1.676 m)  Wt 58.8 kg (129 lb 10.1 oz)  BMI 20.92 kg/m2  SpO2 100%  Neurologic Exam: Disoriented to time as well as place. Moderately agitated and only partially cooperative with neurological exam. She refused evaluation of pupillary reactions. Extraocular movements were full. Visual fields were intact bilaterally. There was no facial weakness. She moved all extremities equally with good strength.  Lab Results: Results for orders placed during the hospital encounter of 05/22/11 (from the past 48 hour(s))  URINALYSIS, ROUTINE W REFLEX MICROSCOPIC     Status: Abnormal   Collection Time   05/22/11  4:10 PM      Component Value Range Comment   Color, Urine YELLOW  YELLOW     APPearance CLOUDY (*) CLEAR     Specific Gravity, Urine 1.039 (*) 1.005 - 1.030     pH 5.0  5.0 - 8.0     Glucose, UA >1000 (*) NEGATIVE (mg/dL)    Hgb urine dipstick TRACE (*) NEGATIVE     Bilirubin Urine NEGATIVE  NEGATIVE     Ketones, ur NEGATIVE  NEGATIVE (mg/dL)    Protein, ur NEGATIVE  NEGATIVE (mg/dL)    Urobilinogen, UA 0.2  0.0 - 1.0 (mg/dL)    Nitrite POSITIVE (*) NEGATIVE     Leukocytes, UA NEGATIVE  NEGATIVE    URINE CULTURE     Status: Normal   Collection Time   05/22/11  4:10 PM      Component Value Range Comment   Specimen Description URINE, CATHETERIZED      Special Requests NONE      Culture  Setup Time [id number]      Colony Count >=100,000 COLONIES/ML      Culture ESCHERICHIA COLI      Report Status 05/24/2011 FINAL      Organism ID, Bacteria ESCHERICHIA COLI     URINE MICROSCOPIC-ADD ON     Status: Abnormal   Collection Time   05/22/11  4:10 PM      Component Value Range Comment   Squamous Epithelial / LPF FEW (*) RARE     WBC, UA 3-6  <3 (WBC/hpf)    RBC / HPF 0-2  <3  (RBC/hpf)    Bacteria, UA MANY (*) RARE     Urine-Other AMORPHOUS URATES/PHOSPHATES     KETONES, QUALITATIVE     Status: Normal   Collection Time   05/22/11  4:47 PM      Component Value Range Comment   Acetone, Bld NEGATIVE  NEGATIVE    GLUCOSE, CAPILLARY     Status: Abnormal   Collection Time   05/22/11  6:17 PM      Component Value Range Comment   Glucose-Capillary 378 (*) 70 - 99 (mg/dL)   GLUCOSE, CAPILLARY     Status: Abnormal   Collection Time   05/22/11  7:56 PM      Component Value Range Comment   Glucose-Capillary 386 (*) 70 - 99 (mg/dL)   GLUCOSE, CAPILLARY     Status: Abnormal   Collection Time   05/22/11  9:46 PM      Component Value Range Comment   Glucose-Capillary 404 (*) 70 - 99 (mg/dL)    Comment 1 Notify RN      Comment 2 Documented in  Chart     MRSA PCR SCREENING     Status: Normal   Collection Time   05/22/11 10:30 PM      Component Value Range Comment   MRSA by PCR NEGATIVE  NEGATIVE    GLUCOSE, CAPILLARY     Status: Abnormal   Collection Time   05/22/11 10:41 PM      Component Value Range Comment   Glucose-Capillary 298 (*) 70 - 99 (mg/dL)   GLUCOSE, CAPILLARY     Status: Abnormal   Collection Time   05/22/11 11:46 PM      Component Value Range Comment   Glucose-Capillary 234 (*) 70 - 99 (mg/dL)   GLUCOSE, CAPILLARY     Status: Abnormal   Collection Time   05/23/11 12:48 AM      Component Value Range Comment   Glucose-Capillary 166 (*) 70 - 99 (mg/dL)   GLUCOSE, CAPILLARY     Status: Abnormal   Collection Time   05/23/11  1:44 AM      Component Value Range Comment   Glucose-Capillary 127 (*) 70 - 99 (mg/dL)   GLUCOSE, CAPILLARY     Status: Abnormal   Collection Time   05/23/11  2:53 AM      Component Value Range Comment   Glucose-Capillary 118 (*) 70 - 99 (mg/dL)   GLUCOSE, CAPILLARY     Status: Abnormal   Collection Time   05/23/11  3:56 AM      Component Value Range Comment   Glucose-Capillary 134 (*) 70 - 99 (mg/dL)   BASIC METABOLIC PANEL     Status:  Abnormal   Collection Time   05/23/11  8:54 AM      Component Value Range Comment   Sodium 150 (*) 135 - 145 (mEq/L)    Potassium 4.4  3.5 - 5.1 (mEq/L) HEMOLYSIS AT THIS LEVEL MAY AFFECT RESULT   Chloride 115 (*) 96 - 112 (mEq/L)    CO2 23  19 - 32 (mEq/L)    Glucose, Bld 297 (*) 70 - 99 (mg/dL)    BUN 18  6 - 23 (mg/dL)    Creatinine, Ser 3.08  0.50 - 1.10 (mg/dL)    Calcium 9.6  8.4 - 10.5 (mg/dL)    GFR calc non Af Amer 77 (*) >90 (mL/min)    GFR calc Af Amer 90 (*) >90 (mL/min)   CBC     Status: Abnormal   Collection Time   05/23/11  8:54 AM      Component Value Range Comment   WBC 10.0  4.0 - 10.5 (K/uL)    RBC 5.13 (*) 3.87 - 5.11 (MIL/uL)    Hemoglobin 15.2 (*) 12.0 - 15.0 (g/dL)    HCT 65.7 (*) 84.6 - 46.0 (%)    MCV 93.4  78.0 - 100.0 (fL)    MCH 29.6  26.0 - 34.0 (pg)    MCHC 31.7  30.0 - 36.0 (g/dL)    RDW 96.2  95.2 - 84.1 (%)    Platelets 226  150 - 400 (K/uL)   GLUCOSE, CAPILLARY     Status: Abnormal   Collection Time   05/23/11  9:01 AM      Component Value Range Comment   Glucose-Capillary 256 (*) 70 - 99 (mg/dL)    Comment 1 Notify RN     GLUCOSE, CAPILLARY     Status: Abnormal   Collection Time   05/23/11  1:04 PM      Component Value Range Comment  Glucose-Capillary 215 (*) 70 - 99 (mg/dL)    Comment 1 Notify RN     GLUCOSE, CAPILLARY     Status: Abnormal   Collection Time   05/23/11  5:02 PM      Component Value Range Comment   Glucose-Capillary 138 (*) 70 - 99 (mg/dL)    Comment 1 Notify RN      Comment 2 Documented in Chart     GLUCOSE, CAPILLARY     Status: Abnormal   Collection Time   05/23/11  9:55 PM      Component Value Range Comment   Glucose-Capillary 119 (*) 70 - 99 (mg/dL)    Comment 1 Documented in Chart      Comment 2 Notify RN     BASIC METABOLIC PANEL     Status: Abnormal   Collection Time   05/24/11  4:31 AM      Component Value Range Comment   Sodium 154 (*) 135 - 145 (mEq/L)    Potassium 4.0  3.5 - 5.1 (mEq/L)    Chloride 116 (*) 96  - 112 (mEq/L)    CO2 26  19 - 32 (mEq/L)    Glucose, Bld 212 (*) 70 - 99 (mg/dL)    BUN 15  6 - 23 (mg/dL)    Creatinine, Ser 9.60  0.50 - 1.10 (mg/dL)    Calcium 9.8  8.4 - 10.5 (mg/dL)    GFR calc non Af Amer 76 (*) >90 (mL/min)    GFR calc Af Amer 88 (*) >90 (mL/min)   CBC     Status: Abnormal   Collection Time   05/24/11  4:31 AM      Component Value Range Comment   WBC 6.9  4.0 - 10.5 (K/uL)    RBC 5.02  3.87 - 5.11 (MIL/uL)    Hemoglobin 14.4  12.0 - 15.0 (g/dL)    HCT 45.4 (*) 09.8 - 46.0 (%)    MCV 93.8  78.0 - 100.0 (fL)    MCH 28.7  26.0 - 34.0 (pg)    MCHC 30.6  30.0 - 36.0 (g/dL)    RDW 11.9  14.7 - 82.9 (%)    Platelets 202  150 - 400 (K/uL)   GLUCOSE, CAPILLARY     Status: Abnormal   Collection Time   05/24/11  7:44 AM      Component Value Range Comment   Glucose-Capillary 207 (*) 70 - 99 (mg/dL)    Comment 1 Documented in Chart      Comment 2 Notify RN     GLUCOSE, CAPILLARY     Status: Abnormal   Collection Time   05/24/11 11:51 AM      Component Value Range Comment   Glucose-Capillary 286 (*) 70 - 99 (mg/dL)    Comment 1 Documented in Chart      Comment 2 Notify RN       Studies/Results: EEG showed generalized slowing with low amplitude diffuse cortical activity consistent with patient's history of dementia. No epileptiform activity was recorded.  Mr Brain Wo Contrast  05/23/2011  *RADIOLOGY REPORT*  Clinical Data: New onset seizure.  The examination had to be discontinued prior to completion due to patient refused further imaging.  She had been given ativan twice.  MRI HEAD WITHOUT CONTRAST  Technique:  Multiplanar, multiecho pulse sequences of the brain and surrounding structures were obtained according to standard protocol without intravenous contrast.  Comparison: CT head without contrast 05/22/2011.  Findings: The diffusion weighted images  demonstrate no evidence for acute or subacute infarction.  Ventricular enlargement is again noted.  No obstructing lesion  is evident.  There is some patient motion and it is difficult to know whether the aqueduct of Sylvius is patent.  Moderate periventricular and scattered subcortical T2 and FLAIR hyperintensities are present bilaterally.  Moderate generalized atrophy is present.  Flow is present in the major intracranial arteries.  The patient is status post bilateral lens extractions.  The globes and orbits are otherwise intact.  Scattered mucosal thickening is present throughout the paranasal sinuses.  The mastoid air cells are clear.  IMPRESSION:  1.  No acute or focal lesion to account for the patient's seizure. 2.  Mild ventricular enlargement.  This may represent a normal pressure hydrocephalus.  A fair amount of atrophy is present as well.  It is unclear hydrocephalus is evident.  No obstructing lesion is present. 3.  Moderate atrophy white matter disease.  This likely reflects the sequelae of chronic microvascular ischemia. 4.  Mild mucosal thickening throughout the paranasal sinuses.  No fluid is present.  Original Report Authenticated By: Jamesetta Orleans. MATTERN, M.D.    Medications:  Scheduled:   . cefTRIAXone (ROCEPHIN)  IV  1 g Intravenous Q24H  . hydrALAZINE  10 mg Intravenous Q6H  . insulin aspart  0-20 Units Subcutaneous TID WC  . insulin aspart  0-5 Units Subcutaneous QHS  . insulin glargine  10 Units Subcutaneous QHS  . levETIRAcetam  500 mg Oral BID  . sodium chloride  3 mL Intravenous Q12H  . DISCONTD: cefTRIAXone (ROCEPHIN)  IV  1 g Intravenous Q24H  . DISCONTD: feeding supplement  237 mL Oral BID BM  . DISCONTD: levetiracetam  500 mg Intravenous Q12H  . DISCONTD: LORazepam  1 mg Intravenous Once    Assessment/Plan: New-onset seizure disorder associated with dementia. There is no evidence of an acute structural brain lesion including no signs of acute stroke. Seizure activity most likely as a manifestation of progression of dementia.  Recommend continuing Keppra 500 mg twice a day for seizure  control. No further neurodiagnostic studies are indicated at this point.  C.R. Roseanne Reno, MD [facility] [phone] 05/24/2011  4:07 PM

## 2011-05-24 NOTE — Clinical Documentation Improvement (Signed)
MALNUTRITION DOCUMENTATION CLARIFICATION  THIS DOCUMENT IS NOT A PERMANENT PART OF THE MEDICAL RECORD  TO RESPOND TO THE THIS QUERY, FOLLOW THE INSTRUCTIONS BELOW:  1. If needed, update documentation for the patient's encounter via the notes activity.  2. Access this query again and click edit on the In [employer].  3. After updating, or not, click F2 to complete all highlighted (required) fields concerning your review. Select "additional documentation in the medical record" OR "no additional documentation provided".  4. Click Sign note button.  5. The deficiency will fall out of your In Basket *Please let us know if you are not able to complete this workflow by phone or e-mail (listed below).  Please update your documentation within the medical record to reflect your response to this query.                                                                                        05/24/11   Dear Dr. Shela Commons. [facility],  In a better effort to capture your patient's severity of illness, reflect appropriate length of stay and utilization of resources, a review of the patient medical record has revealed the following indicators.    Based on your clinical judgment, please clarify and document in a progress note and/or discharge summary the clinical condition associated with the following supporting information:  In responding to this query please exercise your independent judgment.  The fact that a query is asked, does not imply that any particular answer is desired or expected.   Per Nutritionist consult note  (4/8) patient meets criteria for "severe malnutrition in context  of chronic illness" if this is an acceptable secondary diagnosis please document in chart, if not please document appropriate diagnosis pertaining to patient's nutritional status.  Thank you     Possible Clinical Conditions?   _______Mild Malnutrition  _______Moderate Malnutrition _______Severe Malnutrition     _______Protein Calorie Malnutrition _______Severe Protein Calorie Malnutrition _______Other Condition________________ _______Cannot clinically determine     Supporting Information: Risk Factors: seizure, age, diabetes mellitus , dementia  Signs & Symptoms: Ht 5'5"    Wt 129 lbs  BMI:  21.6  Weight  Loss 22% over 6.5 months   Diagnostics:  Calcium level: 9.6 (4/8)  Treatment : rec ensure complete bid   Nutrition Consult: Severe malnutrition in the context of chronic illness     You may use possible, probable, or suspect with inpatient documentation. possible, probable, suspected diagnoses MUST be documented at the time of discharge  Reviewed:pt dcd no response from MD protrack query remains open  Thank You,  Leonette Most Brynna Dobos  Clinical Documentation Specialist RN, BSN:  Pager [phone] HIM off [phone]  Health Information Management [place]

## 2011-05-24 NOTE — Evaluation (Signed)
Physical Therapy Evaluation Patient Details Name: Kristi Snow MRN: [id number] DOB: 21-Feb-1924 Today's Date: 05/24/2011  Problem List:  Patient Active Problem List  Diagnoses  . Prolapsed uterus  . E. coli UTI (urinary tract infection)  . Back pain  . Dementia  . Lumbar spinal stenosis  . Acute hypernatremia  . Hypertension  . New onset seizure  . Cerebral ventriculomegaly  . Bradycardia  . Diabetes mellitus  . Dehydration    Past Medical History:  Past Medical History  Diagnosis Date  . Hypertension   . Diabetes mellitus   . Back injury   . Encephalopathy, unspecified   . Urinary tract infection   . Dementia   . Prolapsed uterus   . Lumbar spinal stenosis    Past Surgical History:  Past Surgical History  Procedure Date  . No past surgeries     PT Assessment/Plan/Recommendation PT Assessment Clinical Impression Statement: 78 y.o. female admitted to [facility] for new onset seizure, UTI and increased sodium levels.  She presents today with increased confusion compared to baseline, decreased verbalization, decreased strength, decreased mobility, decreased activity tolerance, decreased balance, and decreased activity/exercise tolerance.  She would benefit from acute PT to maximize her independence, functional mobility and safety so that after extensive SNF level therapies she may be able to return to ALF memory care unit level of care.   PT Recommendation/Assessment: Patient will need skilled PT in the acute care venue PT Problem List: Decreased strength;Decreased activity tolerance;Decreased balance;Decreased mobility;Decreased cognition;Decreased safety awareness PT Therapy Diagnosis : Difficulty walking;Abnormality of gait;Generalized weakness PT Plan PT Frequency: Min 3X/week PT Treatment/Interventions: DME instruction;Gait training;Functional mobility training;Therapeutic activities;Therapeutic exercise;Balance training;Neuromuscular re-education;Cognitive  remediation;Patient/family education PT Recommendation Follow Up Recommendations: Skilled nursing facility Equipment Recommended: Defer to next venue PT Goals  Acute Rehab PT Goals PT Goal Formulation: Patient unable to participate in goal setting Time For Goal Achievement: 2 weeks Pt will go Supine/Side to Sit: with min assist PT Goal: Supine/Side to Sit - Progress: Goal set today Pt will Sit at Edge of Bed: with supervision PT Goal: Sit at [facility] Of Bed - Progress: Goal set today Pt will go Sit to Supine/Side: with min assist PT Goal: Sit to Supine/Side - Progress: Goal set today Pt will go Sit to Stand: with mod assist PT Goal: Sit to Stand - Progress: Goal set today Pt will go Stand to Sit: with mod assist PT Goal: Stand to Sit - Progress: Goal set today Pt will Transfer Bed to Chair/Chair to Bed: with mod assist PT Transfer Goal: Bed to Chair/Chair to Bed - Progress: Goal set today Pt will Ambulate: 16 - 50 feet;with mod assist;with rolling walker (try rollator, it is a more familiar device for her. ) PT Goal: Ambulate - Progress: Goal set today  PT Evaluation Precautions/Restrictions  Precautions Precautions: Fall Precaution Comments: Pt with history of dementia, now more severe since admission per son report. Required Braces or Orthoses: No Restrictions Weight Bearing Restrictions: No Prior Functioning  Home Living Lives With:  (at ALF per son, Kathlene November) Receives Help From:  (staff) Type of Home: Assisted living Home Layout: One level Home Access: Level entry Additional Comments: Her son reports that she just this week moved from [facility] to [facility]F.  She was recieving both PT and OT at [facility].  She is currently staying in a memory care unit.   Prior Function Level of Independence: Requires assistive device for independence;Needs assistance with ADLs Bath: Minimal Dressing: Minimal Cognition Cognition  Arousal/Alertness:  Awake/alert Overall Cognitive Status: Impaired Cognition - Other Comments: The patient is not verbalizing much, doesn't seem to be processing what I would like for her to do.  Son reports that she did recognize him this morning.   Extremity Assessment RLE Strength RLE Overall Strength Comments: grossly 3-/5 LLE Strength LLE Overall Strength Comments: grossly 3-/5, no obvious asymmetries noted R vs L leg Mobility (including Balance) Bed Mobility Bed Mobility: Yes Supine to Sit: 2: Max assist;HOB elevated (Comment degrees) (HOB 45 degrees) Supine to Sit Details (indicate cue type and reason): max assist to help assist legs to EOB and to support trunk to sit up on side of bed.  Patient is resisting with her legs and pushing posteriorly with her trunk likely because she doesn't understand what is going on and what I would like for her to do.   Sitting - Scoot to Edge of Bed: 3: Mod assist Sitting - Scoot to Edge of Bed Details (indicate cue type and reason): the patient did better once sitting to support herself, but was unable to actively scoot.  Used draw pad to weight shift hips to EOB and some support needed at trunk to prevent posterior LOB.   Transfers Transfers: Yes Sit to Stand: From bed;From elevated surface;1: +2 Total assist Sit to Stand Details (indicate cue type and reason): patient 50% with very flexed trunk and extended knees upon standing.  Stood Clinical research associate for [employer].  Required two people to support patient and one to perform total assist pericare for + BM.  Verbal cues for upright posture/   Stand to Sit: 1: +2 Total assist Stand to Sit Details: patient 50% to help control descent to sit on bed and then in chair after transfer.  Attempted with RW, but patient did not hold onto RW.  Son reports she uses a rollator with a seat normally.  Hands are very arthritic and did not seem like she could hold onto this RW.   Stand Pivot Transfers: 1: +2 Total assist;From elevated surface Stand Pivot  Transfer Details (indicate cue type and reason): Patient 50% from elevated bed to recliner chair on her right hand side.  The patient was able to take good weight on her legs, but trunk stayed flexed about 45-50 degrees during transfer.  She did take pivotal steps around with assist needed to weight shift to be able to take steps.  Ambulation/Gait Ambulation/Gait: No  Static Sitting Balance Static Sitting - Balance Support: Bilateral upper extremity supported;Feet supported Static Sitting - Level of Assistance: 3: Mod assist Static Sitting - Comment/# of Minutes: mod assist to support trunk to prevent posterior lean initially upon sitting.  She was able to get to min assist after PT worked a little on anterior weight shifting in sitting with manual PROM.   End of Session PT - End of Session Equipment Utilized During Treatment: Gait belt (RW) Activity Tolerance: Treatment limited secondary to medical complications (Comment) (limited by confusion) Patient left: in chair;with call bell in reach Theme park manager in room assisting with tolieting and transfer) Nurse Communication:  (son's report of baseline (because I called him on the phone)) General Behavior During Session:  (confused) Cognition: Impaired, at baseline (but more impaired now per son)  Lurena Joiner B. Areanna Gengler, PT, DPT [phone] 05/24/2011, 4:16 PM

## 2011-05-24 NOTE — Evaluation (Signed)
Occupational Therapy Evaluation Patient Details Name: Kristi Snow MRN: [id number] DOB: 10-Jun-1924 Today's Date: 05/24/2011  Problem List:  Patient Active Problem List  Diagnoses  . Prolapsed uterus  . E. coli UTI (urinary tract infection)  . Back pain  . Dementia  . Lumbar spinal stenosis  . Acute hypernatremia  . Hypertension  . New onset seizure  . Cerebral ventriculomegaly  . Bradycardia  . Diabetes mellitus  . Dehydration    Past Medical History:  Past Medical History  Diagnosis Date  . Hypertension   . Diabetes mellitus   . Back injury   . Encephalopathy, unspecified   . Urinary tract infection   . Dementia   . Prolapsed uterus   . Lumbar spinal stenosis    Past Surgical History:  Past Surgical History  Procedure Date  . No past surgeries     OT Assessment/Plan/Recommendation OT Assessment Clinical Impression Statement: Pt admitted with new onset seizure and history of dementia.  Currently with more severe cognitive and physical impariments than previous baseline.  Will benefit from acute OT needs to address current increase in dependence with basic selfcare tasks.  Feel pt will need extensive SNF rehab before returning to the ALF. OT Recommendation/Assessment: Patient will need skilled OT in the acute care venue OT Problem List: Decreased strength;Impaired balance (sitting and/or standing);Impaired UE functional use;Decreased safety awareness;Decreased cognition;Decreased coordination;Decreased knowledge of use of DME or AE Barriers to Discharge: Decreased caregiver support OT Therapy Diagnosis : Generalized weakness;Cognitive deficits OT Plan OT Frequency: Min 1X/week OT Treatment/Interventions: Self-care/ADL training;Therapeutic activities;Therapeutic exercise;DME and/or AE instruction;Balance training;Patient/family education;Cognitive remediation/compensation OT Recommendation Follow Up Recommendations: Skilled nursing facility Equipment Recommended: Defer  to next venue Individuals Consulted Consulted and Agree with Results and Recommendations: Patient unable/family or caregiver not available OT Goals Acute Rehab OT Goals OT Goal Formulation: Patient unable to participate in goal setting Time For Goal Achievement: 2 weeks ADL Goals Pt Will Perform Eating: with min assist;with adaptive utensils;Sitting, chair;Supported ADL Goal: Eating - Progress: Goal set today Pt Will Perform Grooming: with min assist;with adaptive equipment;Supported;Sitting, chair (2 tasks) ADL Goal: Grooming - Progress: Goal set today Pt Will Perform Upper Body Bathing: with min assist;Sitting, edge of bed;Sitting, chair;Unsupported ADL Goal: Upper Body Bathing - Progress: Goal set today Pt Will Perform Lower Body Bathing: with mod assist;Sit to stand from bed;Sit to stand from chair ADL Goal: Lower Body Bathing - Progress: Goal set today Pt Will Transfer to Toilet: with mod assist;3-in-1 ADL Goal: Toilet Transfer - Progress: Goal set today  OT Evaluation Precautions/Restrictions  Precautions Precautions: Fall Precaution Comments: Pt with history of dementia, now more severe since admission per son report. Required Braces or Orthoses: No Restrictions Weight Bearing Restrictions: No Prior Functioning Home Living Lives With: Other (Comment) (Was at ALF) Receives Help From:  (staff) Type of Home: Assisted living Home Layout: One level Home Access: Level entry Additional Comments: Pt unable to provide information regarding PLOF.  Per son she was needing only minimal assist for mobility and performance of basic selfcare tasks. Prior Function Level of Independence: Needs assistance with ADLs;Independent with transfers;Requires assistive device for independence Bath: Minimal Dressing: Minimal Driving: No Vocation: Retired ADL ADL Eating/Feeding: Performed;Maximal assistance Where Assessed - Eating/Feeding: Chair Grooming: Maximal assistance;Wash/dry  face;Performed Grooming Details (indicate cue type and reason): Pt resistant when attempting hand over hand to wash her face. Where Assessed - Grooming: Sitting, chair Upper Body Bathing: Simulated;+1 Total assistance Where Assessed - Upper Body Bathing: Sitting, chair;Supported Lower Body Bathing: +  2 Total assistance Lower Body Bathing Details (indicate cue type and reason): Pt 30% for sit to stand Where Assessed - Lower Body Bathing: Sit to stand from chair Upper Body Dressing: Simulated;+1 Total assistance Where Assessed - Upper Body Dressing: Sitting, chair;Supported Lower Body Dressing: Simulated;+2 Total assistance Lower Body Dressing Details (indicate cue type and reason): Pt 30% for sit to stand Where Assessed - Lower Body Dressing: Sit to stand from chair Toilet Transfer: Simulated;+1 Total assistance Toilet Transfer Method: Stand pivot Toilet Transfer Equipment: Other (comment) (Pt incontinent of bowel) Toileting - Clothing Manipulation: +2 Total assistance Toileting - Clothing Manipulation Details (indicate cue type and reason): Pt 30% for sit to stand. Where Assessed - Toileting Clothing Manipulation:  (Sit to stand from the edge of the bed.) Where Assessed - Toileting Hygiene:  (Sit to stand from the edge of the bed.) Ambulation Related to ADLs: Pt unable to ambulate currently with OT. ADL Comments: Pt with no initation of any task.  Resistant to taking a drink of her tea initially with therapist giving hand over hand asisstance.  By end of session she was able to perform with max assist using the RUE.  Non-verbal to 90% of questions.  Only responded by stating her name when asked.. Vision/Perception  Vision - History Baseline Vision: No visual deficits Patient Visual Report: No change from baseline Vision - Assessment Vision Assessment: Vision not tested Perception Perception: Not tested Praxis Praxis: Not tested Cognition Cognition Arousal/Alertness:  Awake/alert Overall Cognitive Status: Impaired Attention: Impaired Current Attention Level: Focused Orientation Level: Oriented to person Cognition - Other Comments: Pt not participating in conversation when asked questions about orientation or prior functional level. Sensation/Coordination Sensation Light Touch: Not tested Stereognosis: Not tested Hot/Cold: Not tested Proprioception: Not tested Coordination Gross Motor Movements are Fluid and Coordinated: No Fine Motor Movements are Fluid and Coordinated: No Coordination and Movement Description: Pt with limited UE AROM based on cognition.  Would assist therapist with lfting arms to approximately 100 degees of bilateral shoulder flexion.  Limited hand function secondary to moderate arthritis. Extremity Assessment RUE Assessment RUE Assessment: Exceptions to [facility] RUE AROM (degrees) RUE Overall AROM Comments: Pt with severe arthritis in digits.  Able to elicit approximately 70 % of full grasp and release.  Pt resistant to elbow flexion and extension secondary to cognition but demonstrates strength of at least 4/5 in these areas.  Able to perform approximately 0-110 degrees of AAROM shoulder flexion. LUE Assessment LUE Assessment: Exceptions to WFL LUE AROM (degrees) LUE Overall AROM Comments: Pt withonly trace finger flexion and extension when asked.  Moderate arthritic changes in the digits noted.  Resistant to elbow flexion and extension at a 4/5 level.  AAROM shoulder flexion to at least 110 degrees. Mobility  Bed Mobility Bed Mobility: No Supine to Sit: 2: Max assist;HOB elevated (Comment degrees) (HOB 45 degrees) Supine to Sit Details (indicate cue type and reason): max assist to help assist legs to EOB and to support trunk to sit up on side of bed.  Patient is resisting with her legs and pushing posteriorly with her trunk likely because she doesn't understand what is going on and what I would like for her to do.   Sitting - Scoot to  Edge of Bed: 3: Mod assist Sitting - Scoot to Edge of Bed Details (indicate cue type and reason): the patient did better once sitting to support herself, but was unable to actively scoot.  Used draw pad to weight shift hips to EOB  and some support needed at trunk to prevent posterior LOB.   Transfers Transfers: Yes Sit to Stand: 1: +1 Total assist Sit to Stand Details (indicate cue type and reason): patient 50% with very flexed trunk and extended knees upon standing.  Stood Clinical research associate for [employer].  Required two people to support patient and one to perform total assist pericare for + BM.  Verbal cues for upright posture/   Stand to Sit: 1: +1 Total assist Stand to Sit Details: patient 50% to help control descent to sit on bed and then in chair after transfer.  Attempted with RW, but patient did not hold onto RW.  Son reports she uses a rollator with a seat normally.  Hands are very arthritic and did not seem like she could hold onto this RW.    End of Session OT - End of Session Activity Tolerance: Other (comment) (Limited session secondary to pt participation.) Patient left: in chair;with call bell in reach Nurse Communication: Mobility status for transfers General Behavior During Session: Lethargic Cognition: Impaired   Shelton Soler,[place]ager 05/24/2011, 4:36 PM

## 2011-05-24 NOTE — Progress Notes (Signed)
Clinical social worker left message for patient son to complete psychosocial assessment. Per chart review, patient is from [facility] alf. CSW to confirm with facility to determine if patient can return to alf or if pt will need snf.   .Clinical social worker continuing to follow pt to assist with pt dc plans and further csw needs.   Catha Gosselin, Theresia Majors  [phone] .05/24/2011 1709pm

## 2011-05-24 NOTE — Progress Notes (Signed)
Vitals entered on wrong patient. 

## 2011-05-24 NOTE — Procedures (Signed)
EEG NUMBER:  [id number].  This routine EEG was requested in this 78 year old woman who was admitted with new onset seizures and hyperglycemia. She is now poorly responsive with agitation.  She has a history of dementia as well as ventriculomegaly found on CT.  MEDICATIONS:  Include Keppra and Ativan.  The EEG was done with the patient unresponsive to voice commands and agitated as well as drowsy.  During periods of maximal alertness background activities were composed of suppressed low-amplitude polymorphic delta activities.  There did appear to be very low amplitude alpha activities.  There were symmetric.Marland Kitchen  Photic stimulation and hyperventilation were not performed.  The patient did become drowsy as characterized by attenuation of the muscle activity, and a short bursts of more generalized alpha activities that were low amplitude, but symmetric.  CLINICAL INTERPRETATION:  This routine EEG done with the patient not responsive to commands but awake as well as drowsy is abnormal.  The diffusely suppressed activities combined  generalized delta activities  suggest a moderate encephalopathy.  Diffuse to very low amplitude activities also can be seen in diffuse cortical abnormalities or fluid collections. Clinical correlation is advised.  No epileptiform abnormalities, electrographic seizures, or nonconvulsive status epilepticus was seen.          ______________________________ Denton Meek, MD    [id number] D:  05/24/2011 07:10:05  T:  05/24/2011 07:24:58  Job #:  [id number]

## 2011-05-25 LAB — GLUCOSE, CAPILLARY
Glucose-Capillary: 264 mg/dL — ABNORMAL HIGH (ref 70–99)
Glucose-Capillary: 270 mg/dL — ABNORMAL HIGH (ref 70–99)
Glucose-Capillary: 260 mg/dL — ABNORMAL HIGH (ref 70–99)

## 2011-05-25 LAB — BASIC METABOLIC PANEL
Calcium: 8.9 mg/dL (ref 8.4–10.5)
Chloride: 108 mEq/L (ref 96–112)
Creatinine, Ser: 0.82 mg/dL (ref 0.50–1.10)
GFR calc Af Amer: 74 mL/min — ABNORMAL LOW (ref >90)
Sodium: 142 mEq/L (ref 135–145)

## 2011-05-25 MED ORDER — POTASSIUM CHLORIDE CRYS ER 20 MEQ PO TBCR
40.0000 meq | EXTENDED_RELEASE_TABLET | Freq: Once | ORAL | Status: AC
  Administered 2011-05-25: 40 meq via ORAL
  Filled 2011-05-25: qty 2

## 2011-05-25 NOTE — Progress Notes (Signed)
Patient ID: Kristi Snow, female   DOB: October 29, 1924, 78 y.o.   MRN: [id number] Patient is much more awake today. She is smiling and follow some simple commands. He moves all extremities, and denies headache. This appears less and less like untreated hydrocephalus, especially with such improvement in her neurologic exam and mental status. I will sign off now, but we will be available for any question he may have. Thank you

## 2011-05-25 NOTE — Progress Notes (Signed)
Transfer to [id number] via w/c with belongings.  Report already called . Kristi Snow

## 2011-05-25 NOTE — Progress Notes (Addendum)
Clinical Social Work Department CLINICAL SOCIAL WORK PLACEMENT NOTE 05/25/2011  Patient:  RHEYA, MINOGUE  Account Number:  [id number] Admit date:  05/22/2011  Clinical Social Worker:  Doree Albee  Date/time:  05/25/2011 04:20 PM  Clinical Social Work is seeking post-discharge placement for this patient at the following level of care:   SKILLED NURSING   (*CSW will update this form in Epic as items are completed)   05/25/2011  Patient/family provided with [facility] Department of Clinical Social Work's list of facilities offering this level of care within the geographic area requested by the patient (or if unable, by the patient's family).  05/25/2011  Patient/family informed of their freedom to choose among providers that offer the needed level of care, that participate in Medicare, Medicaid or managed care program needed by the patient, have an available bed and are willing to accept the patient.  05/25/2011  Patient/family informed of [facility]' ownership interest in [facility], as well as of the fact that they are under no obligation to receive care at this facility.  PASARR submitted to EDS on 05/25/2011 PASARR number received from EDS on 05/26/2011  FL2 transmitted to all facilities in geographic area requested by pt/family on  05/25/2011 FL2 transmitted to all facilities within larger geographic area on   Patient informed that his/her managed care company has contracts with or will negotiate with  certain facilities, including the following:     Patient/family informed of bed offers received:  05/26/2011 Patient chooses bed at [facility] Physician recommends and patient chooses bed at    Patient to be transferred to  on  05/27/2011 Patient to be transferred to facility by [facility]  The following physician request were entered in Epic:   Additional Comments: Pt form [place] living, pt son feels patient will need snf for rehab. CSW will  confirm with facilities and begin snf search if patient not able to return to alf.  Catha Gosselin, Theresia Majors  [phone] .05/25/2011 1621pm

## 2011-05-25 NOTE — Progress Notes (Signed)
Inpatient Diabetes Program Recommendations  AACE/ADA: New Consensus Statement on Inpatient Glycemic Control (2009)  Target Ranges:  Prepandial:   less than 140 mg/dL      Peak postprandial:   less than 180 mg/dL (1-2 hours)      Critically ill patients:  140 - 180 mg/dL   Reason for Visit: Results for Kristi Snow, Kristi Snow (MRN [id number]) as of 05/25/2011 13:37  Ref. Range 05/24/2011 11:51 05/24/2011 16:20 05/24/2011 22:00 05/25/2011 08:02 05/25/2011 12:19  Glucose-Capillary Latest Range: 70-99 mg/dL 782 (H) 956 (H) 213 (H) 270 (H) 260 (H)   Consider q 4 hour moderate Novolog insulin coverage (if CBG's remain greater than 200 mg/dL).  Continue to titrate Lantus based on fasting CBG's.    Note: Will follow.

## 2011-05-25 NOTE — Progress Notes (Signed)
Report called to Kristi Snow pt going to [id number] via w/c with belongings. Kristi Snow

## 2011-05-25 NOTE — Progress Notes (Signed)
Clinical Social Work Department BRIEF PSYCHOSOCIAL ASSESSMENT 05/25/2011  Patient:  Kristi Snow, Kristi Snow     Account Number:  [id number]     Admit date:  05/22/2011  Clinical Social Worker:  Doree Albee  Date/Time:  05/25/2011 01:00 PM  Referred by:  RN  Date Referred:  05/24/2011 Referred for  ALF Placement   Other Referral:   patient admitted from [facility]   Interview type:  Family Other interview type:   Patient is only oriented to slef occasionally at this time.    PSYCHOSOCIAL DATA Living Status:  FACILITY Admitted from facility:  [facility] Level of care:  Assisted Living Primary support name:  Meah Jiron Primary support relationship to patient:  CHILD, ADULT Degree of support available:   strong    CURRENT CONCERNS Current Concerns  Post-Acute Placement   Other Concerns:    SOCIAL WORK ASSESSMENT / PLAN CSW spoke with pt son regarding pt current living environment and pt disposition needs. CSW discuss with pt son the reccomendation by physical therapy for short term rehab at skilled nursing facility.    Pt son stated that patient was walking indepdently and able to provide most care for herself at the assited living in regards to ADLs.    CSW wil submitt information to assisted living facility to confirm if facility can meet patient needs.    Pt son also agreed to a snf search for rehab as pt son feel pt will need rehab.   Assessment/plan status:  Psychosocial Support/Ongoing Assessment of Needs Other assessment/ plan:   discharge planning   Information/referral to community resources:   snf list-emailed    PATIENT'S/FAMILY'S RESPONSE TO PLAN OF CARE: Pt son appreciated csw concern and support. CSW and Pt son to discuss bed offers tomorrow. CSW will complete Fl2 for md signature and place in shadow chart.    please see placement note following.      Catha Gosselin, Theresia Majors  [phone] .05/25/2011 1618pm

## 2011-05-25 NOTE — Progress Notes (Signed)
[facility] [place] TEAM 1 - Stepdown/ICU TEAM  Subjective: Very alert today and appears likely at baseline. Sitting up in chair. No family in the room at this time.  Is somewhat confused.    Objective: Blood pressure 116/63, pulse 67, temperature 97.7 F (36.5 C), temperature source Axillary, resp. rate 15, height 5\' 6"  (1.676 m), weight 58.8 kg (129 lb 10.1 oz), SpO2 100.00%.  General appearance: no acute resp distress - slowed mentation Resp: clear to auscultation bilaterally, now on room air maintaining saturations at 100% Cardio: regular rate and rhythm, S1, S2 normal, no murmur, click, rub or gallop GI: soft, non-tender; bowel sounds normal; no masses,  no organomegaly Extremities: extremities normal, atraumatic, no cyanosis or edema Neurologic: Alert and oriented to name only, exam nonfocal.  Lab Results:  [facility] 05/24/11 0431 05/23/11 0854  WBC 6.9 10.0  HGB 14.4 15.2*  HCT 47.1* 47.9*  PLT 202 226   BMET  Basename 05/25/11 0330 05/24/11 0431  NA 142 154*  K 3.1* 4.0  CL 108 116*  CO2 25 26  GLUCOSE 339* 212*  BUN 19 15  CREATININE 0.82 0.74  CALCIUM 8.9 9.8    Studies/Results: No results found.  Medications:  I have reviewed the patient's current medications.  Assessment/Plan:  New onset seizure/ Cerebral ventriculomegaly *Appreciate neurology and neurosurgery assistant *MRI of brain showsed mild ventricular enlargement that may be consistent with normal pressure hydrocephalus in addition there is a fair amount of atrophy present - neurosurgery documents that does not have rapidly progressive mental status decline and at this point no indications for neurosurgical intervention *Neurology recommends continuing Keppra twice a day for continued seizure management  Acute hypernatremia/Dehydration *Hemoglobin was 15 consistent with hemoconcentration-trend is downward after hydration initiated *A sodium has normalized after administration of  dextrose solution overnight. Patient eating well so we'll discontinue IV fluids.  Acute hypoxic respiratory failure *Stable O2 saturations 100% and now tolerating room air *Chest x-ray negative so suspect hypoxemia directly related to postictal phase after seizures   Escherichia coli UTI (lower urinary tract infection) *greater than 100,000 colonies of Escherichia coli seen and is pansensitive except is resistant to Macrodantin *received a dose of Rocephin in the emergency department at presentation and Rocephin was continued after admission - today is day #4  Hypertension/ Bradycardia *better controlled with addition of scheduled hydralazine *IV Beta blocker stopped secondary to bradycardia-was on Verapamil and Imdur prior to presentation *Continue Imdur  Hypokalemia *Oral replete and check electrolyte panel in the morning  Diabetes mellitus *CBG"s have been at times greater than 300 but this is because of dextrose IV solution. We'll discontinue dextrose and IV fluids and anticipate improvement in CBG's *Was on NPH at nursing home *Now on lantus (dose increased to 20 units on 05/24/2011) and resistant sliding scale.   Dementia with acute metabolic encephalopathy *Resident of assisted living facility-her baseline mental status is that of a person who is alert, conversant and mildly disoriented but is able to ambulate with a walker *out of bed to chair 3 times a day and we have asked PT and OT to evaluate the patient  Sebaceous cysts of chest *These appear to be chronic and skin is without erythema or cellulitic changes   Disposition *Transferred to neurology floor *Continue physical therapy and occupational therapy. May need short-term skilled nursing facility before returns to assisted living/memory care unit level.   LOS: 3 days   Junious Silk, ANP pager [phone]  [facility] [email or web address] Password: [email or web address]  05/25/2011, 11:33 AM  I have personally examined  this patient and reviewed the entire database. I have reviewed the above note, made any necessary editorial changes, and agree with its content.  Lonia Blood, MD [facility]

## 2011-05-25 NOTE — Progress Notes (Signed)
                                       [facility] PRO[facility]ESS NOTE    SUBJECTIVE   No further sz.   OBJECTIVE   Vital signs in last 24 hours: Temp:  [97.6 F (36.4 C)-98.2 F (36.8 C)] 97.7 F (36.5 C) (04/10 0716) Pulse Rate:  [62-103] 67  (04/10 0716) Resp:  [11-22] 15  (04/10 0716) BP: (106-144)/(48-73) 116/63 mmHg (04/10 0716) SpO2:  [96 %-100 %] 100 % (04/10 0716)  Intake/Output from previous day: 04/09 0701 - 04/10 0700 In: 2840 [P.O.:780; I.V.:1800; IV Piggyback:260] Out: 750 [Urine:750] Intake/Output this shift: Total I/O In: 585 [P.O.:360; I.V.:225] Out: 200 [Urine:200] Nutritional status: Carb Control  Past Medical History  Diagnosis Date  . Hypertension   . Diabetes mellitus   . Back injury   . Encephalopathy, unspecified   . Urinary tract infection   . Dementia   . Prolapsed uterus   . Lumbar spinal stenosis     Neurologic Exam:   Mental Status: Alert, not oriented, not able to follow complex commands.  Sitting in chair comfortably.  Minimal speech output. Able to follow 1 step commands without difficulty. Cranial Nerves: II-Visual fields grossly intact. III/IV/VI-Extraocular movements intact.  Pupils reactive bilaterally. V/VII-Smile symmetric VIII-grossly intact --winces to pain XI-bilateral shoulder shrug XII-midline tongue extension Motor: moving all extremities antigravity and purposefully.  Briskly withdraws from pain all 4 extremities.  Sensory: Pinprick and light touch intact throughout, bilaterally Deep Tendon Reflexes: 1+ and symmetric throughout Plantars: Downgoing bilaterally   Lab Results: No results found for this basename: cbc, bmp, coags, chol, tri, ldl, hga1c   Lipid Panel No results found for this basename: CHOL,TRIG,HDL,CHOLHDL,VLDL,LDLCALC in the last 72 hours  Studies/Results: No results found.  Medications:     Scheduled:   . cefTRIAXone (ROCEPHIN)  IV  1 g Intravenous Q24H  . insulin aspart  0-20  Units Subcutaneous TID WC  . insulin aspart  0-5 Units Subcutaneous QHS  . insulin glargine  20 Units Subcutaneous QHS  . isosorbide mononitrate  20 mg Oral BID  . levETIRAcetam  500 mg Oral BID  . sodium chloride  3 mL Intravenous Q12H  . DISCONTD: hydrALAZINE  10 mg Intravenous Q6H  . DISCONTD: insulin glargine  10 Units Subcutaneous QHS  . DISCONTD: levetiracetam  500 mg Intravenous Q12H    Assessment/Plan:  Seizure activity most likely as a manifestation of progression of dementia   Recommend continuing Keppra 500 mg twice a day for seizure control and treating UTI. No further neurodiagnostic studies are indicated at this point. Neurology signing off.   Please call with questions.   Felicie Morn PA-C [facility] [phone]  05/25/2011, 9:41 AM

## 2011-05-26 LAB — GLUCOSE, CAPILLARY
Glucose-Capillary: 94 mg/dL (ref 70–99)
Glucose-Capillary: 159 mg/dL — ABNORMAL HIGH (ref 70–99)
Glucose-Capillary: 328 mg/dL — ABNORMAL HIGH (ref 70–99)

## 2011-05-26 LAB — CBC
HCT: 40.9 % (ref 36.0–46.0)
Hemoglobin: 13.2 g/dL (ref 12.0–15.0)
MCV: 90.9 fL (ref 78.0–100.0)
RDW: 14.1 % (ref 11.5–15.5)
WBC: 6.8 10*3/uL (ref 4.0–10.5)

## 2011-05-26 LAB — BASIC METABOLIC PANEL
BUN: 11 mg/dL (ref 6–23)
CO2: 26 mEq/L (ref 19–32)
Chloride: 110 mEq/L (ref 96–112)
Creatinine, Ser: 0.62 mg/dL (ref 0.50–1.10)
Glucose, Bld: 85 mg/dL (ref 70–99)
Potassium: 3.4 mEq/L — ABNORMAL LOW (ref 3.5–5.1)

## 2011-05-26 MED ORDER — POTASSIUM CHLORIDE CRYS ER 20 MEQ PO TBCR
40.0000 meq | EXTENDED_RELEASE_TABLET | Freq: Once | ORAL | Status: AC
  Administered 2011-05-26: 40 meq via ORAL
  Filled 2011-05-26: qty 2

## 2011-05-26 MED ORDER — INSULIN ASPART 100 UNIT/ML ~~LOC~~ SOLN
0.0000 [IU] | Freq: Three times a day (TID) | SUBCUTANEOUS | Status: DC
  Administered 2011-05-26: 7 [IU] via SUBCUTANEOUS
  Administered 2011-05-27: 1 [IU] via SUBCUTANEOUS
  Administered 2011-05-27: 9 [IU] via SUBCUTANEOUS

## 2011-05-26 NOTE — Progress Notes (Signed)
Patient ID: Kristi Snow  female  ZOX:[id number]    DOB: [birth year]    DOA: 05/22/2011  PCP: Alva Garnet., MD, MD  Subjective: Alert, awake and confused, smiling, oriented to self  Objective: Weight change:   Intake/Output Summary (Last 24 hours) at 05/26/11 1209 Last data filed at 05/26/11 0900  Gross per 24 hour  Intake    680 ml  Output    600 ml  Net     80 ml   Blood pressure 142/67, pulse 72, temperature 97.4 F (36.3 C), temperature source Oral, resp. rate 18, height 5\' 6"  (1.676 m), weight 66.8 kg (147 lb 4.3 oz), SpO2 100.00%.  Physical Exam: General: Alert and awake, oriented to self  not in any acute distress. HEENT: anicteric sclera, pupils reactive to light and accommodation, EOMI CVS: S1-S2 clear, no murmur rubs or gallops Chest: clear to auscultation bilaterally, no wheezing, rales or rhonchi Abdomen: soft nontender, nondistended, normal bowel sounds, no organomegaly Extremities: no cyanosis, clubbing or edema noted bilaterally   Lab Results: Basic Metabolic Panel:  Lab 05/26/11 8119 05/25/11 0330  NA 144 142  K 3.4* 3.1*  CL 110 108  CO2 26 25  GLUCOSE 85 339*  BUN 11 19  CREATININE 0.62 0.82  CALCIUM 8.9 8.9  MG -- --  PHOS -- --   Liver Function Tests:  Lab 05/22/11 1454  AST 13  ALT 13  ALKPHOS 136*  BILITOT 0.4  PROT 7.7  ALBUMIN 3.7   No results found for this basename: LIPASE:2,AMYLASE:2 in the last 168 hours No results found for this basename: AMMONIA:2 in the last 168 hours CBC:  Lab 05/26/11 0640 05/24/11 0431 05/22/11 1454  WBC 6.8 6.9 --  NEUTROABS -- -- 5.4  HGB 13.2 14.4 --  HCT 40.9 47.1* --  MCV 90.9 93.8 --  PLT 164 202 --   Cardiac Enzymes:  Lab 05/22/11 1454  CKTOTAL --  CKMB --  CKMBINDEX --  TROPONINI <0.30   BNP: No components found with this basename: POCBNP:2 CBG:  Lab 05/26/11 0815 05/25/11 2258 05/25/11 1658 05/25/11 1219 05/25/11 0802  GLUCAP 94 105* 187* 260* 270*     Micro Results: Recent  Results (from the past 240 hour(s))  CULTURE, BLOOD (ROUTINE X 2)     Status: Normal (Preliminary result)   Collection Time   05/22/11  2:45 PM      Component Value Range Status Comment   Specimen Description BLOOD RIGHT ARM   Final    Special Requests BOTTLES DRAWN AEROBIC ONLY 5CC   Final    Culture  Setup Time [id number]   Final    Culture     Final    Value:        BLOOD CULTURE RECEIVED NO GROWTH TO DATE CULTURE WILL BE HELD FOR 5 DAYS BEFORE ISSUING A FINAL NEGATIVE REPORT   Report Status PENDING   Incomplete   CULTURE, BLOOD (ROUTINE X 2)     Status: Normal (Preliminary result)   Collection Time   05/22/11  2:50 PM      Component Value Range Status Comment   Specimen Description BLOOD RIGHT ARM   Final    Special Requests BOTTLES DRAWN AEROBIC ONLY 2CC   Final    Culture  Setup Time [id number]   Final    Culture     Final    Value:        BLOOD CULTURE RECEIVED NO GROWTH TO DATE CULTURE WILL BE HELD FOR  5 DAYS BEFORE ISSUING A FINAL NEGATIVE REPORT   Report Status PENDING   Incomplete   URINE CULTURE     Status: Normal   Collection Time   05/22/11  4:10 PM      Component Value Range Status Comment   Specimen Description URINE, CATHETERIZED   Final    Special Requests NONE   Final    Culture  Setup Time [id number]   Final    Colony Count >=100,000 COLONIES/ML   Final    Culture ESCHERICHIA COLI   Final    Report Status 05/24/2011 FINAL   Final    Organism ID, Bacteria ESCHERICHIA COLI   Final   MRSA PCR SCREENING     Status: Normal   Collection Time   05/22/11 10:30 PM      Component Value Range Status Comment   MRSA by PCR NEGATIVE  NEGATIVE  Final     Studies/Results: Ct Head Wo Contrast  05/22/2011  *RADIOLOGY REPORT*  Clinical Data: Seizure  CT HEAD WITHOUT CONTRAST  Technique:  Contiguous axial images were obtained from the base of the skull through the vertex without contrast.  Comparison: None.  Findings: Moderate ventricular enlargement involving the third and  lateral ventricles.  Hypodensity in the periventricular white matter, this could be due to transependymal resorption of CSF or chronic ischemia.  No acute infarct.  Negative for hemorrhage or mass lesion.  Mild chronic sinusitis.  No acute skull abnormality.  Scaphocephaly of the calvarium is noted.  IMPRESSION: Moderate ventricular enlargement of the third and  lateral ventricles.  This may indicate obstructive hydrocephalous. Communicating hydrocephalus is also possible.  Comparison with prior imaging is suggested to determine stability.  If no prior studies are available, MRI may be helpful.  Original Report Authenticated By: Camelia Phenes, M.D.   Mr Brain Wo Contrast  05/23/2011  *RADIOLOGY REPORT*  Clinical Data: New onset seizure.  The examination had to be discontinued prior to completion due to patient refused further imaging.  She had been given ativan twice.  MRI HEAD WITHOUT CONTRAST  Technique:  Multiplanar, multiecho pulse sequences of the brain and surrounding structures were obtained according to standard protocol without intravenous contrast.  Comparison: CT head without contrast 05/22/2011.  Findings: The diffusion weighted images demonstrate no evidence for acute or subacute infarction.  Ventricular enlargement is again noted.  No obstructing lesion is evident.  There is some patient motion and it is difficult to know whether the aqueduct of Sylvius is patent.  Moderate periventricular and scattered subcortical T2 and FLAIR hyperintensities are present bilaterally.  Moderate generalized atrophy is present.  Flow is present in the major intracranial arteries.  The patient is status post bilateral lens extractions.  The globes and orbits are otherwise intact.  Scattered mucosal thickening is present throughout the paranasal sinuses.  The mastoid air cells are clear.  IMPRESSION:  1.  No acute or focal lesion to account for the patient's seizure. 2.  Mild ventricular enlargement.  This may  represent a normal pressure hydrocephalus.  A fair amount of atrophy is present as well.  It is unclear hydrocephalus is evident.  No obstructing lesion is present. 3.  Moderate atrophy white matter disease.  This likely reflects the sequelae of chronic microvascular ischemia. 4.  Mild mucosal thickening throughout the paranasal sinuses.  No fluid is present.  Original Report Authenticated By: Jamesetta Orleans. MATTERN, M.D.   Dg Chest Port 1 View  05/22/2011  *RADIOLOGY REPORT*  Clinical Data:  Seizure, hyperglycemia  PORTABLE CHEST - 1 VIEW  Comparison: None.  Findings: Cardiomediastinal silhouette is unremarkable. Degenerative changes thoracic spine.  Extensive degenerative changes bilateral shoulders.  No acute infiltrate or pulmonary edema.  No diagnostic pneumothorax.  IMPRESSION: No active disease.  Degenerative changes thoracic spine and bilateral shoulders.  Original Report Authenticated By: Natasha Mead, M.D.    Medications: Scheduled Meds:   . cefTRIAXone (ROCEPHIN)  IV  1 g Intravenous Q24H  . insulin aspart  0-5 Units Subcutaneous QHS  . insulin aspart  0-9 Units Subcutaneous TID WC  . insulin glargine  20 Units Subcutaneous QHS  . isosorbide mononitrate  20 mg Oral BID  . levETIRAcetam  500 mg Oral BID  . potassium chloride  40 mEq Oral Once  . potassium chloride  40 mEq Oral Once  . sodium chloride  3 mL Intravenous Q12H  . DISCONTD: insulin aspart  0-20 Units Subcutaneous TID WC   Continuous Infusions:    Assessment/Plan:   New onset seizure/ Cerebral ventriculomegaly: MRI of brain showsed mild ventricular enlargement that may be consistent with NPH with atrophy present - Appreciate neurology and neurosurgery assistant  - neurosurgery documents that does not have rapidly progressive mental status decline and at this point no indications for neurosurgical intervention  -Neurology recommends continuing Keppra twice a day for continued seizure management   Acute  hypernatremia/Dehydration  - Sodium 144, IV fluids were discontinued, patient eating well, closely monitor  Acute hypoxic respiratory failure: Resolved  - Stable O2 saturations 100% and now tolerating room air  - Chest x-ray negative so suspect hypoxemia directly related to postictal phase after seizures   Escherichia coli UTI (lower urinary tract infection)- on Rocephin   Hypertension/ Bradycardia  -Continue Imdur, currently on PRN hydralazine, IV beta blocker discontinued secondary to bradycardia   Hypokalemia: Replaced   Diabetes mellitus: - Change to sensitive sliding scale, continue Lantus   Dementia with acute metabolic encephalopathy   -  OT recommending skilled nursing facility  Sebaceous cysts of chest: Chronic   DVT Prophylaxis:  Code Status: Full code  Disposition: Will need skilled nursing facility, SW aware   LOS: 4 days   Colonel Krauser M.D. [facility] 05/26/2011, 12:09 PM Pager: [phone]

## 2011-05-26 NOTE — Progress Notes (Signed)
Covering Clinical Child psychotherapist (CSW) provided pt son Lacoya Wilbanks with bed offers as pt will need snf placement at dc. [facility] aware that pt will dc to a skilled nursing facility at dc before returning to facility. CSW will follow up tomorrow with son to determine which facility he prefers for pt.  Theresia Bough, MSW, Theresia Majors [phone]

## 2011-05-26 NOTE — Progress Notes (Signed)
Physical Therapy Treatment Patient Details Name: Kristi Snow MRN: [id number] DOB: 11-17-1924 Today's Date: 05/26/2011  PT Assessment/Plan  PT - Assessment/Plan Comments on Treatment Session: Pt required assistance with bed mobility and transfer. Pt required +2 assistance with transfer for safety.  PT Plan: Discharge plan remains appropriate;Frequency remains appropriate PT Frequency: Min 3X/week Follow Up Recommendations: Skilled nursing facility Equipment Recommended: Defer to next venue PT Goals  Acute Rehab PT Goals PT Goal: Supine/Side to Sit - Progress: Progressing toward goal PT Goal: Sit at Edge Of Bed - Progress: Progressing toward goal PT Goal: Sit to Stand - Progress: Progressing toward goal PT Goal: Stand to Sit - Progress: Progressing toward goal  PT Treatment Precautions/Restrictions  Precautions Precautions: Fall Precaution Comments: Pt with history of dementia, now more severe since admission per son report. Required Braces or Orthoses DO NOT USE: No Restrictions Weight Bearing Restrictions: No Mobility (including Balance) Bed Mobility Bed Mobility: Yes Supine to Sit: 3: Mod assist Supine to Sit Details (indicate cue type and reason): assitance with trunk/shoulder rot  Sitting - Scoot to Edge of Bed: 3: Mod assist Sitting - Scoot to Edge of Bed Details (indicate cue type and reason): assitance with trunk rot Transfers Stand Pivot Transfers: 1: +2 Total assist;Patient percentage (comment);With armrests (Pt=20%) Stand Pivot Transfer Details (indicate cue type and reason): Pt went bed to chair transfer. Pt required VC"s and demo for foot placement and hand placement on armrest.    Ambulation/Gait Ambulation/Gait: No Stairs: No Wheelchair Mobility Wheelchair Mobility: No    Exercise    End of Session PT - End of Session Equipment Utilized During Treatment: Gait belt Activity Tolerance: Treatment limited secondary to medical complications (Comment) Pt with  worsening dementia per son Patient left: in chair;with call bell in reach Nurse Communication: Mobility status for transfers General Behavior During Session: [facility] for tasks performed Cognition: Impaired, at baseline  Tamera Stands 05/26/2011, 2:55 PM  05/26/2011 Fredrich Birks PTA [phone] pager [phone] office

## 2011-05-26 NOTE — Progress Notes (Signed)
Inpatient Diabetes Program Recommendations  AACE/ADA: New Consensus Statement on Inpatient Glycemic Control (2009)  Target Ranges:  Prepandial:   less than 140 mg/dL      Peak postprandial:   less than 180 mg/dL (1-2 hours)      Critically ill patients:  140 - 180 mg/dL   Reason for Visit: CBG's improved today.  Inpatient Diabetes Program Recommendations Insulin - IV drip/GlucoStabilizer: . Insulin - Basal: . Correction (SSI): Decrease correction to sensitive tid with meals and HS.  Note: will follow.

## 2011-05-27 LAB — HEMOGLOBIN A1C
Hgb A1c MFr Bld: 9.1 % — ABNORMAL HIGH (ref <5.7)
Mean Plasma Glucose: 214 mg/dL — ABNORMAL HIGH (ref <117)

## 2011-05-27 LAB — BASIC METABOLIC PANEL
BUN: 13 mg/dL (ref 6–23)
CO2: 26 mEq/L (ref 19–32)
Chloride: 106 mEq/L (ref 96–112)
GFR calc non Af Amer: 82 mL/min — ABNORMAL LOW (ref >90)
Glucose, Bld: 124 mg/dL — ABNORMAL HIGH (ref 70–99)
Potassium: 3.7 mEq/L (ref 3.5–5.1)
Sodium: 141 mEq/L (ref 135–145)

## 2011-05-27 LAB — GLUCOSE, CAPILLARY: Glucose-Capillary: 141 mg/dL — ABNORMAL HIGH (ref 70–99)

## 2011-05-27 MED ORDER — CEPHALEXIN 500 MG PO CAPS: 500.0000 mg | ORAL_CAPSULE | Freq: Two times a day (BID) | ORAL | Status: AC

## 2011-05-27 MED ORDER — HYDRALAZINE HCL 10 MG PO TABS
10.0000 mg | ORAL_TABLET | Freq: Three times a day (TID) | ORAL | Status: DC
Start: 2011-05-27 — End: 2013-06-10

## 2011-05-27 MED ORDER — LORAZEPAM 0.5 MG PO TABS: 0.5000 mg | ORAL_TABLET | Freq: Three times a day (TID) | ORAL | Status: DC | PRN

## 2011-05-27 MED ORDER — INSULIN GLARGINE 100 UNIT/ML ~~LOC~~ SOLN: 20.0000 [IU] | Freq: Every day | SUBCUTANEOUS | Status: DC

## 2011-05-27 MED ORDER — LEVETIRACETAM 500 MG PO TABS: 500.0000 mg | ORAL_TABLET | Freq: Two times a day (BID) | ORAL | Status: DC

## 2011-05-27 NOTE — Discharge Summary (Signed)
Physician Discharge Summary  Patient ID: Kristi Snow MRN: [id number] DOB/AGE: [birth year] 78 y.o.  Admit date: 05/22/2011 Discharge date: 05/27/2011  Primary Care Physician:  Alva Garnet., MD, MD  Discharge Diagnoses:     .Acute hypernatremia: Resolved  .Hypertension .New onset seizure .Cerebral ventriculomegaly .Diabetes mellitus .Dementia .Dehydration .E. coli UTI (urinary tract infection)  Consults: .neurology Dr Noel Christmas.  Discharge Medications: Medication List  As of 05/27/2011 11:46 AM   STOP taking these medications         insulin NPH 100 UNIT/ML injection      orphenadrine 100 MG tablet      oxycodone 5 MG capsule      verapamil 240 MG CR tablet         TAKE these medications         acetaminophen 500 MG tablet   Commonly known as: TYLENOL   Take 500 mg by mouth 3 (three) times daily.      cephALEXin 500 MG capsule   Commonly known as: KEFLEX   Take 1 capsule (500 mg total) by mouth 2 (two) times daily. For 3 more days      cilostazol 100 MG tablet   Commonly known as: PLETAL   Take 100 mg by mouth 2 (two) times daily.      donepezil 5 MG tablet   Commonly known as: ARICEPT   Take 5 mg by mouth daily.      hydrALAZINE 10 MG tablet   Commonly known as: APRESOLINE   Take 1 tablet (10 mg total) by mouth 3 (three) times daily.      insulin glargine 100 UNIT/ML injection   Commonly known as: LANTUS   Inject 20 Units into the skin at bedtime.      isosorbide mononitrate 20 MG tablet   Commonly known as: ISMO,MONOKET   Take 20 mg by mouth 2 (two) times daily. Space doses by at least (7) hours.      levETIRAcetam 500 MG tablet   Commonly known as: KEPPRA   Take 1 tablet (500 mg total) by mouth 2 (two) times daily.      LORazepam 0.5 MG tablet   Commonly known as: ATIVAN   Take 1 tablet (0.5 mg total) by mouth every 8 (eight) hours as needed for anxiety. For anxiety.      Melatonin 3 MG Caps   Take 1 capsule by mouth at bedtime.        polyethylene glycol packet   Commonly known as: MIRALAX / GLYCOLAX   Take 17 g by mouth daily.      sertraline 25 MG tablet   Commonly known as: ZOLOFT   Take 25 mg by mouth every morning.      simvastatin 10 MG tablet   Commonly known as: ZOCOR   Take 10 mg by mouth at bedtime.      Vitamin D (Ergocalciferol) 50000 UNITS Caps   Commonly known as: DRISDOL   Take 50,000 Units by mouth every 7 (seven) days. Takes on Fridays.             Brief H and P: For complete details please refer to admission H and P, but in brief patient is 78 year old female with history of dementia who presented from an assisted living facility in [place] with new-onset seizure. Around lunchtime on the day of admission patient had a seizure and then second seizure in the parking lot lasting about 2 minutes. She was treated with percent, blood pressure on the  scene was 190/100. Patient was transported to emergency room patient was noted to be postictal but protecting her airway. She was admitted for further workup.  Hospital Course:   New onset seizure/ Cerebral ventriculomegaly: Patient was admitted to the hospitalist service. MRI of the brain was done which showed mild ventricular enlargement that may be consistent with NPH with atrophy. Neurology and neurosurgery was consulted. Patient was started on Keppra by neurology recommendations, patient also underwent EEG on 05/24/2011 which was abnormal with the diffusely suppressed activities but no epileptiform abnormalities or electrographic seizures were seen. Patient was also seen by Dr. Yetta Barre, neurosurgery who documented that at this point there is no indications for neurosurgical intervention and patient should be empirically treated for her medical issues especially for hypernatremia and UTI.  Acute hypernatremia/Dehydration: Highest the sodium level of 154. Patient was placed on D5 solution and sodium has improved to 141, patient is eating well   Acute  hypoxic respiratory failure: Resolved,  Stable O2 saturations 100% and now tolerating room air.  Chest x-ray was negative so suspect hypoxemia directly related to postictal phase after seizures   Escherichia coli UTI (lower urinary tract infection)- on Rocephin IV during hospitalization, placed on Keflex for another 3 days to complete a course.   Hypertension/ Bradycardia  -Continue Imdur and patient was placed on PO hydralazine.  Hypokalemia: Replaced   Diabetes mellitus: Patient was continued on sensitive sliding scale, and Lantus   Dementia with acute metabolic encephalopathy : Patient will be discharged to skilled nursing facility. Continue fall precautions, seizure precautions    Day of Discharge BP 120/70  Pulse 96  Temp(Src) 99.7 F (37.6 C) (Oral)  Resp 18  Ht 5\' 6"  (1.676 m)  Wt 66.8 kg (147 lb 4.3 oz)  BMI 23.77 kg/m2  SpO2 96%  Physical Exam: General: Alert and awake not in any acute distress. HEENT: anicteric sclera, pupils reactive to light and accommodation CVS: S1-S2 clear no murmur rubs or gallops Chest: clear to auscultation bilaterally, no wheezing rales or rhonchi Abdomen: soft nontender, nondistended, normal bowel sounds, no organomegaly Extremities: no cyanosis, clubbing or edema noted bilaterally Neuro: Cranial nerves II-XII intact, no focal neurological deficits   The results of significant diagnostics from this hospitalization (including imaging, microbiology, ancillary and laboratory) are listed below for reference.    LAB RESULTS: Basic Metabolic Panel:  Lab 05/27/11 1610 05/26/11 0640  NA 141 144  K 3.7 3.4*  CL 106 110  CO2 26 26  GLUCOSE 124* 85  BUN 13 11  CREATININE 0.58 0.62  CALCIUM 9.0 8.9  MG -- --  PHOS -- --   Liver Function Tests:  Lab 05/22/11 1454  AST 13  ALT 13  ALKPHOS 136*  BILITOT 0.4  PROT 7.7  ALBUMIN 3.7   CBC:  Lab 05/26/11 0640 05/24/11 0431 05/22/11 1454  WBC 6.8 6.9 --  NEUTROABS -- -- 5.4  HGB  13.2 14.4 --  HCT 40.9 47.1* --  MCV 90.9 -- --  PLT 164 202 --   Cardiac Enzymes:  Lab 05/22/11 1454  CKTOTAL --  CKMB --  CKMBINDEX --  TROPONINI <0.30   CBG:  Lab 05/27/11 1135 05/27/11 0651  GLUCAP 305* 141*    Significant Diagnostic Studies:  Ct Head Wo Contrast  05/22/2011  *RADIOLOGY REPORT*  Clinical Data: Seizure  CT HEAD WITHOUT CONTRAST  Technique:  Contiguous axial images were obtained from the base of the skull through the vertex without contrast.  Comparison: None.  Findings:  Moderate ventricular enlargement involving the third and lateral ventricles.  Hypodensity in the periventricular white matter, this could be due to transependymal resorption of CSF or chronic ischemia.  No acute infarct.  Negative for hemorrhage or mass lesion.  Mild chronic sinusitis.  No acute skull abnormality.  Scaphocephaly of the calvarium is noted.  IMPRESSION: Moderate ventricular enlargement of the third and  lateral ventricles.  This may indicate obstructive hydrocephalous. Communicating hydrocephalus is also possible.  Comparison with prior imaging is suggested to determine stability.  If no prior studies are available, MRI may be helpful.  Original Report Authenticated By: Camelia Phenes, M.D.   Mr Brain Wo Contrast  05/23/2011  *RADIOLOGY REPORT*  Clinical Data: New onset seizure.  The examination had to be discontinued prior to completion due to patient refused further imaging.  She had been given ativan twice.  MRI HEAD WITHOUT CONTRAST  Technique:  Multiplanar, multiecho pulse sequences of the brain and surrounding structures were obtained according to standard protocol without intravenous contrast.  Comparison: CT head without contrast 05/22/2011.  Findings: The diffusion weighted images demonstrate no evidence for acute or subacute infarction.  Ventricular enlargement is again noted.  No obstructing lesion is evident.  There is some patient motion and it is difficult to know whether the  aqueduct of [place] is patent.  Moderate periventricular and scattered subcortical T2 and FLAIR hyperintensities are present bilaterally.  Moderate generalized atrophy is present.  Flow is present in the major intracranial arteries.  The patient is status post bilateral lens extractions.  The globes and orbits are otherwise intact.  Scattered mucosal thickening is present throughout the paranasal sinuses.  The mastoid air cells are clear.  IMPRESSION:  1.  No acute or focal lesion to account for the patient's seizure. 2.  Mild ventricular enlargement.  This may represent a normal pressure hydrocephalus.  A fair amount of atrophy is present as well.  It is unclear hydrocephalus is evident.  No obstructing lesion is present. 3.  Moderate atrophy white matter disease.  This likely reflects the sequelae of chronic microvascular ischemia. 4.  Mild mucosal thickening throughout the paranasal sinuses.  No fluid is present.  Original Report Authenticated By: Jamesetta Orleans. MATTERN, M.D.   Dg Chest Port 1 View  05/22/2011  *RADIOLOGY REPORT*  Clinical Data: Seizure, hyperglycemia  PORTABLE CHEST - 1 VIEW  Comparison: None.  Findings: Cardiomediastinal silhouette is unremarkable. Degenerative changes thoracic spine.  Extensive degenerative changes bilateral shoulders.  No acute infiltrate or pulmonary edema.  No diagnostic pneumothorax.  IMPRESSION: No active disease.  Degenerative changes thoracic spine and bilateral shoulders.  Original Report Authenticated By: Natasha Mead, M.D.     Disposition and Follow-up: Discharge Orders    Future Orders Please Complete By Expires   Diet Carb Modified      Increase activity slowly          DISPOSITION: Skilled nursing facility  DIET: Carb modified diet  ACTIVITY: As tolerated  DISCHARGE FOLLOW-UP Follow-up Information    Follow up with Alva Garnet., MD. Schedule an appointment as soon as possible for a visit in 2 weeks. (for hospital follow-up)        Follow up with STEWART,CHARLES R. Schedule an appointment as soon as possible for a visit in 3 weeks. (Neurology follow-up )    Contact information:   [address] [place] [address]          Time spent on Discharge: 45 mins  Signed:  Vinnie Gombert M.D. [facility] 05/27/2011, 11:46 AM

## 2011-05-27 NOTE — Progress Notes (Signed)
Nutrition Follow-up  Diet Order:  CHO Modified Medium Pt seen eating Breakfast; ate 95%. Intake has been 75-100% of her meals.  Pt is unable to answer questions but was feeding herself. Noted pt to be discharged to SNF. Per RN pt has been drinking Ensure Complete bid.   Meds: Scheduled Meds:   . cefTRIAXone (ROCEPHIN)  IV  1 g Intravenous Q24H  . insulin aspart  0-5 Units Subcutaneous QHS  . insulin aspart  0-9 Units Subcutaneous TID WC  . insulin glargine  20 Units Subcutaneous QHS  . isosorbide mononitrate  20 mg Oral BID  . levETIRAcetam  500 mg Oral BID  . sodium chloride  3 mL Intravenous Q12H   Continuous Infusions:  PRN Meds:.acetaminophen, acetaminophen, dextrose, hydrALAZINE, LORazepam, ondansetron (ZOFRAN) IV, ondansetron  Labs:  CMP     Component Value Date/Time   NA 141 05/27/2011 0550   K 3.7 05/27/2011 0550   CL 106 05/27/2011 0550   CO2 26 05/27/2011 0550   GLUCOSE 124* 05/27/2011 0550   BUN 13 05/27/2011 0550   CREATININE 0.58 05/27/2011 0550   CALCIUM 9.0 05/27/2011 0550   PROT 7.7 05/22/2011 1454   ALBUMIN 3.7 05/22/2011 1454   AST 13 05/22/2011 1454   ALT 13 05/22/2011 1454   ALKPHOS 136* 05/22/2011 1454   BILITOT 0.4 05/22/2011 1454   GFRNONAA 82* 05/27/2011 0550   GFRAA >90 05/27/2011 0550   CBG (last 3)   Basename 05/27/11 1135 05/27/11 0651 05/26/11 1712  GLUCAP 305* 141* 328*    Intake/Output Summary (Last 24 hours) at 05/27/11 1227 Last data filed at 05/27/11 0900  Gross per 24 hour  Intake    600 ml  Output   2127 ml  Net  -1527 ml    Weight Status:   05/26/11: 147 lbs, admit wt was 129 lbs  Re-estimated needs:  Kcal: 1450-1650 Protein: 70-85 grams  Nutrition Dx:  Inadequate oral intake (NI-2.1). Status: Resloved  Goal:  Intake to meet >90% of estimated nutrition needs to prevent further depletion of lean body mass; met.  Intervention:    Continue Ensure complete bid  Monitor:  PO intake, labs, weight trend.  Kendell Bane Cornelison Pager #:   [phone]

## 2011-05-27 NOTE — Progress Notes (Addendum)
Clinical Social Work  CSW spoke with son Kathlene November) who stated that he wanted patient to go to [facility] for [facility]. CSW called SNF and faxed dc summary and medications. SNF agreeable to patient admission. CSW prepared dc packet with FL2, pasarr number and hard scripts. CSW spoke with RN who stated patient would be ready at 1530. CSW also informed patient of dc and informed patient that CSW had been in contact with her son regarding dc plans. Patient agreeable to dc plan as well. CSW coordinated transportation via [place]. CSW is signing off.  [place], [place] [phone]

## 2011-05-27 NOTE — Progress Notes (Signed)
Inpatient Diabetes Program Recommendations  AACE/ADA: New Consensus Statement on Inpatient Glycemic Control (2009)  Target Ranges:  Prepandial:   less than 140 mg/dL      Peak postprandial:   less than 180 mg/dL (1-2 hours)      Critically ill patients:  140 - 180 mg/dL   Reason for Visit: Results for Kristi Snow, Kristi Snow (MRN [id number]) as of 05/27/2011 12:59  Ref. Range 05/26/2011 17:12 05/27/2011 05:50 05/27/2011 06:51 05/27/2011 11:35 05/27/2011 12:50  Glucose-Capillary Latest Range: 70-99 mg/dL 578 (H)  469 (H) 629 (H) 366 (H)    Note: Consider adding Novolog meal coverage 3 units tid with meals to cover CHO intake.

## 2011-05-28 LAB — CULTURE, BLOOD (ROUTINE X 2): Culture: NO GROWTH

## 2011-06-09 ENCOUNTER — Telehealth: Payer: Self-pay

## 2011-06-09 NOTE — Telephone Encounter (Signed)
Left message with pt son listed on [place] form [facility]im[facility] Needle) re: needing to come in for pessary change. Pt son stated that she is not doing well at this time. Pt is currently in skilled nursing facility- [facility] and has suffered an episode of seizures within the last few weeks.We will contact facility to see if pt can come in for appt. PG CMA

## 2011-06-10 ENCOUNTER — Telehealth: Payer: Self-pay

## 2011-06-10 NOTE — Telephone Encounter (Signed)
Encounter completed.

## 2011-06-10 NOTE — Telephone Encounter (Signed)
Spoke with Nurse at health facility in which pt is staying, was advised that pt's son is responsible for bringing her to her appts due to insurance purposes they will not transport her. Message was left for pt's son to contact our office to schedule ov for pt. PG CMA

## 2011-06-12 ENCOUNTER — Encounter (HOSPITAL_COMMUNITY): Payer: Self-pay | Admitting: *Deleted

## 2011-06-12 ENCOUNTER — Emergency Department (HOSPITAL_COMMUNITY): Payer: Medicare Other

## 2011-06-12 ENCOUNTER — Inpatient Hospital Stay (HOSPITAL_COMMUNITY)
Admission: EM | Admit: 2011-06-12 | Discharge: 2011-06-15 | DRG: 639 | Disposition: A | Discharge status: Skilled Nursing Facility | Payer: Medicare Other | Attending: Family Medicine | Admitting: Family Medicine

## 2011-06-12 DIAGNOSIS — I10 Essential (primary) hypertension: Secondary | ICD-10-CM | POA: Diagnosis present

## 2011-06-12 DIAGNOSIS — E1169 Type 2 diabetes mellitus with other specified complication: Principal | ICD-10-CM | POA: Diagnosis present

## 2011-06-12 DIAGNOSIS — E870 Hyperosmolality and hypernatremia: Secondary | ICD-10-CM

## 2011-06-12 DIAGNOSIS — G9389 Other specified disorders of brain: Secondary | ICD-10-CM

## 2011-06-12 DIAGNOSIS — E860 Dehydration: Secondary | ICD-10-CM

## 2011-06-12 DIAGNOSIS — Z794 Long term (current) use of insulin: Secondary | ICD-10-CM

## 2011-06-12 DIAGNOSIS — N814 Uterovaginal prolapse, unspecified: Secondary | ICD-10-CM

## 2011-06-12 DIAGNOSIS — M48061 Spinal stenosis, lumbar region without neurogenic claudication: Secondary | ICD-10-CM

## 2011-06-12 DIAGNOSIS — F0390 Unspecified dementia without behavioral disturbance: Secondary | ICD-10-CM | POA: Diagnosis present

## 2011-06-12 DIAGNOSIS — N390 Urinary tract infection, site not specified: Secondary | ICD-10-CM

## 2011-06-12 DIAGNOSIS — E162 Hypoglycemia, unspecified: Secondary | ICD-10-CM

## 2011-06-12 DIAGNOSIS — R569 Unspecified convulsions: Secondary | ICD-10-CM

## 2011-06-12 DIAGNOSIS — Z79899 Other long term (current) drug therapy: Secondary | ICD-10-CM

## 2011-06-12 DIAGNOSIS — M549 Dorsalgia, unspecified: Secondary | ICD-10-CM

## 2011-06-12 DIAGNOSIS — T68XXXA Hypothermia, initial encounter: Secondary | ICD-10-CM

## 2011-06-12 DIAGNOSIS — R001 Bradycardia, unspecified: Secondary | ICD-10-CM

## 2011-06-12 DIAGNOSIS — E11649 Type 2 diabetes mellitus with hypoglycemia without coma: Secondary | ICD-10-CM

## 2011-06-12 DIAGNOSIS — E1165 Type 2 diabetes mellitus with hyperglycemia: Secondary | ICD-10-CM | POA: Diagnosis present

## 2011-06-12 LAB — GLUCOSE, CAPILLARY
Glucose-Capillary: 36 mg/dL — CL (ref 70–99)
Glucose-Capillary: 54 mg/dL — ABNORMAL LOW (ref 70–99)
Glucose-Capillary: 57 mg/dL — ABNORMAL LOW (ref 70–99)

## 2011-06-12 LAB — CBC
HCT: 34.3 % — ABNORMAL LOW (ref 36.0–46.0)
Hemoglobin: 11.4 g/dL — ABNORMAL LOW (ref 12.0–15.0)
MCH: 29.9 pg (ref 26.0–34.0)
MCHC: 33.2 g/dL (ref 30.0–36.0)
MCV: 90 fL (ref 78.0–100.0)
Platelets: 170 10*3/uL (ref 150–400)
RBC: 3.81 MIL/uL — ABNORMAL LOW (ref 3.87–5.11)
RDW: 14.1 % (ref 11.5–15.5)
WBC: 4.9 10*3/uL (ref 4.0–10.5)

## 2011-06-12 LAB — BASIC METABOLIC PANEL
Calcium: 9.1 mg/dL (ref 8.4–10.5)
GFR calc Af Amer: >90 mL/min (ref >90)
GFR calc non Af Amer: 78 mL/min — ABNORMAL LOW (ref >90)
Glucose, Bld: 139 mg/dL — ABNORMAL HIGH (ref 70–99)
Potassium: 3.5 mEq/L (ref 3.5–5.1)
Sodium: 142 mEq/L (ref 135–145)

## 2011-06-12 LAB — DIFFERENTIAL
Basophils Absolute: 0 10*3/uL (ref 0.0–0.1)
Basophils Relative: 0 % (ref 0–1)
Eosinophils Absolute: 0 10*3/uL (ref 0.0–0.7)
Eosinophils Relative: 0 % (ref 0–5)
Lymphocytes Relative: 13 % (ref 12–46)
Lymphs Abs: 0.6 10*3/uL — ABNORMAL LOW (ref 0.7–4.0)
Monocytes Absolute: 0.4 10*3/uL (ref 0.1–1.0)
Monocytes Relative: 9 % (ref 3–12)
Neutro Abs: 3.9 10*3/uL (ref 1.7–7.7)
Neutrophils Relative %: 78 % — ABNORMAL HIGH (ref 43–77)

## 2011-06-12 LAB — LACTIC ACID, PLASMA: Lactic Acid, Venous: 1.9 mmol/L (ref 0.5–2.2)

## 2011-06-12 MED ORDER — DEXTROSE 50 % IV SOLN
1.0000 | Freq: Once | INTRAVENOUS | Status: AC
  Administered 2011-06-12: 50 mL via INTRAVENOUS
  Filled 2011-06-12: qty 50

## 2011-06-12 MED ORDER — DEXTROSE 50 % IV SOLN
50.0000 mL | Freq: Once | INTRAVENOUS | Status: AC
  Administered 2011-06-12: 50 mL via INTRAVENOUS

## 2011-06-12 MED ORDER — DEXTROSE 50 % IV SOLN
INTRAVENOUS | Status: AC
  Administered 2011-06-12: 50 mL via INTRAVENOUS
  Filled 2011-06-12: qty 50

## 2011-06-12 MED ORDER — DEXTROSE-NACL 5-0.45 % IV SOLN
INTRAVENOUS | Status: DC
  Administered 2011-06-12: 22:00:00 via INTRAVENOUS

## 2011-06-12 NOTE — ED Notes (Signed)
EDP into room 

## 2011-06-12 NOTE — ED Provider Notes (Signed)
History     CSN: [id number]  Arrival date & time 06/12/11  2059   First MD Initiated Contact with Patient 06/12/11 2108      Chief Complaint  Patient presents with  . Fall  . Hypoglycemia    (Consider location/radiation/quality/duration/timing/severity/associated sxs/prior treatment) Patient is a 78 y.o. female presenting with fall. The history is provided by the patient, the EMS personnel and medical records. The history is limited by the condition of the patient.  Fall The accident occurred less than 1 hour ago. The fall occurred from a stool (from wheelchair). She fell from a height of 1 to 2 ft. She landed on a hard floor. There was no blood loss. Pain location: none. The pain is at a severity of 0/10. The patient is experiencing no pain. She was not ambulatory at the scene. There was no entrapment after the fall. Pertinent negatives include no fever, no abdominal pain, no vomiting and no headaches. Exacerbated by: nothing. Prehospitalization: D50. Treatments tried: D50. The treatment provided mild relief.    Past Medical History  Diagnosis Date  . Hypertension   . Diabetes mellitus   . Back injury   . Encephalopathy, unspecified   . Urinary tract infection   . Dementia   . Prolapsed uterus   . Lumbar spinal stenosis     Past Surgical History  Procedure Date  . No past surgeries     No family history on file.  History  Substance Use Topics  . Smoking status: Never Smoker   . Smokeless tobacco: Not on file  . Alcohol Use: No    OB History    Grav Para Term Preterm Abortions TAB SAB Ect Mult Living                  Review of Systems  Constitutional: Negative for fever.  HENT: Negative for neck pain.   Respiratory: Negative for shortness of breath.   Cardiovascular: Negative for chest pain.  Gastrointestinal: Negative for vomiting and abdominal pain.  Skin: Negative for wound.  Neurological: Negative for headaches.  All other systems reviewed and are  negative.    Allergies  Review of patient's allergies indicates no known allergies.  Home Medications   Current Outpatient Rx  Name Route Sig Dispense Refill  . ACETAMINOPHEN 500 MG PO TABS Oral Take 500 mg by mouth 3 (three) times daily.    . CEPHALEXIN 500 MG PO CAPS Oral Take 500 mg by mouth 4 (four) times daily.    Marland Kitchen CILOSTAZOL 100 MG PO TABS Oral Take 100 mg by mouth 2 (two) times daily.      Marland Kitchen DIPHENHYDRAMINE HCL (SLEEP) 25 MG PO TABS Oral Take 25 mg by mouth 2 (two) times daily.    . DONEPEZIL HCL 5 MG PO TABS Oral Take 5 mg by mouth daily.     Marland Kitchen FLUCONAZOLE 150 MG PO TABS Oral Take 150 mg by mouth once.    Marland Kitchen HYDRALAZINE HCL 10 MG PO TABS Oral Take 1 tablet (10 mg total) by mouth 3 (three) times daily.    . INSULIN GLARGINE 100 UNIT/ML [place] SOLN Subcutaneous Inject 10-45 Units into the skin at bedtime. Per sliding scale. 10 units in the AM and 45 units at bedtime per MAR    . ISOSORBIDE MONONITRATE 20 MG PO TABS Oral Take 20 mg by mouth 2 (two) times daily. Space doses by at least (7) hours.       BP 123/43  Pulse 68  Temp(Src) 95.7 F (35.4 C) (Oral)  Resp 18  SpO2 95%  Physical Exam  Nursing note and vitals reviewed. Constitutional: She appears well-developed and well-nourished.  HENT:  Head: Normocephalic and atraumatic.  Eyes: EOM are normal.  Neck: Normal range of motion. No spinous process tenderness present. Normal range of motion present.  Cardiovascular: Normal rate, regular rhythm and normal heart sounds.   Pulmonary/Chest: Effort normal and breath sounds normal. No respiratory distress. She exhibits no tenderness.  Abdominal: Soft. There is no tenderness.  Musculoskeletal: Normal range of motion.  Neurological: She is alert. GCS eye subscore is 3. GCS verbal subscore is 4. GCS motor subscore is 6.  Skin: Skin is warm and dry.  Psychiatric: She has a normal mood and affect.    ED Course  Procedures (including critical care time)  Date: 06/12/2011  Rate:  70  Rhythm: normal sinus rhythm  QRS Axis: normal  Intervals: normal  ST/T Wave abnormalities: nonspecific T wave changes  Conduction Disutrbances:none  Narrative Interpretation:   Old EKG Reviewed: changes noted   Labs Reviewed  GLUCOSE, CAPILLARY - Abnormal; Notable for the following:    Glucose-Capillary 36 (*)    All other components within normal limits  CBC - Abnormal; Notable for the following:    RBC 3.81 (*)    Hemoglobin 11.4 (*)    HCT 34.3 (*)    All other components within normal limits  DIFFERENTIAL - Abnormal; Notable for the following:    Neutrophils Relative 78 (*)    Lymphs Abs 0.6 (*)    All other components within normal limits  BASIC METABOLIC PANEL - Abnormal; Notable for the following:    Glucose, Bld 139 (*)    GFR calc non Af Amer 78 (*)    All other components within normal limits  GLUCOSE, CAPILLARY - Abnormal; Notable for the following:    Glucose-Capillary 54 (*)    All other components within normal limits  GLUCOSE, CAPILLARY - Abnormal; Notable for the following:    Glucose-Capillary 57 (*)    All other components within normal limits  LACTIC ACID, PLASMA  URINALYSIS, ROUTINE W REFLEX MICROSCOPIC  CULTURE, BLOOD (ROUTINE X 2)  CULTURE, BLOOD (ROUTINE X 2)  URINE CULTURE   Ct Head Wo Contrast  06/13/2011  *RADIOLOGY REPORT*  Clinical Data: Status post fall, hyperglycemia, confusion.  CT HEAD WITHOUT CONTRAST  Technique:  Contiguous axial images were obtained from the base of the skull through the vertex without contrast.  Comparison: 05/23/2011 MRI, 05/22/2011 CT  Findings: Ventriculomegaly is similar to prior. Prominence of the sulci, cisterns, and ventricles, in keeping with volume loss. There are subcortical and periventricular white matter hypodensities, a nonspecific finding most often seen with chronic microangiopathic changes.  There is no evidence for acute hemorrhage, overt hydrocephalus, mass lesion, or abnormal extra-axial fluid  collection.  No definite CT evidence for acute cortical based (large artery) infarction. Left maxillary sinus mucous retention cyst.  Partially opacified right sphenoid chamber.  Mastoid air cells are clear.  No displaced calvarial fracture.  IMPRESSION: Volume loss/ventriculomegaly is similar to prior.  White matter hypodensities, a nonspecific finding often seen with chronic microangiopathic change.  Original Report Authenticated By: Waneta Martins, M.D.   Dg Chest Portable 1 View  06/12/2011  *RADIOLOGY REPORT*  Clinical Data: Hypoglycemia, post fall  PORTABLE CHEST - 1 VIEW  Comparison: 05/22/2011  Findings: Grossly unchanged borderline enlarged cardiac silhouette and mediastinal contours with apparent differences likely secondary to decreased lung volumes  and technique.  There is persistent mild elevation of the right hemidiaphragm and right basilar heterogeneous opacities.  No new focal airspace opacities.  No definite pleural effusion or pneumothorax.  Grossly changed bones including moderate to severe left-sided glenohumeral joint degenerative change.  IMPRESSION: Decreased lung volume without acute cardiopulmonary disease.  Original Report Authenticated By: Waynard Reeds, M.D.     1. Hypoglycemia   2. Hypothermia       MDM   Patient presents after a fall out of her wheelchair. Her prehospital report she slid out of her wheelchair and fell to the floor. Upon EMS arrival she had a low sugar in the 20s. This improved transiently with an amp of D50. Upon arrival here this was rechecked and was again the mid 30s. Given another amp of D50 and started on D5 infusion.  Appears the patient did not eat any dinner and she was given her regular dose of insulin at approximately 7 PM. Feels this is the likely cause. We'll plan on checking screening labs as well as evaluating for traumatic injury.   Though the history of a fall sounds benign, the patient denies any pain, her mental status did not  improve as much as would be expected as her glucose normalized. Per her family member but says she still seemed more drowsy-appearing and she can typically carry on a normal conversation. Given this reason we'll CT her head.  Patient initially mildly hypothermic on arrival at 96. Given this and her hypoglycemia,  blood cultures and further screening labs sent. Her lactate returned normal. Chest x-ray without signs of infection. Head CT also unchanged from prior. Further history later in visit from her son indicates that she a full normal lunch but did not eat any dinner. It appears she was given her full nightly dose of Lantus thus likely explaining her hypoglycemia. She will be admitted to the hospital on a dextrose infusion.   Her sugar did later drop again, but it appears she was off the D5 drip while undergoing CT scan.  Repeat dose of D50 given.     Kristi Poag, MD 06/13/11 [phone]

## 2011-06-12 NOTE — ED Notes (Signed)
Xray at BS 

## 2011-06-12 NOTE — ED Notes (Signed)
Son into room, EDP present.

## 2011-06-12 NOTE — ED Notes (Signed)
Back from CT, no changes, resting/ sleeping, easily arousable to voice, cbg remains unchanged/ low, son at [facility], pending CT results and admitting MD. Will continue to monitor cbg.

## 2011-06-12 NOTE — ED Notes (Signed)
Moved to room blue 3

## 2011-06-12 NOTE — ED Notes (Signed)
PT placed on bair hugger.

## 2011-06-12 NOTE — ED Notes (Signed)
Pt to CT

## 2011-06-12 NOTE — ED Notes (Signed)
Lab finished at BS 

## 2011-06-12 NOTE — ED Notes (Signed)
Unable to do oral temp (bites probe and spits it out/tongues it out). Placed on bair hugger. Son at [facility], calm, NAD, sleeping, easily arousable to voice, follows commands.

## 2011-06-12 NOTE — ED Notes (Addendum)
Pt here by EMS, h/o dementia with anxiety & aggitation, here by EMS from [facility]), fell out of chair around 1940, NH staff had cbg prior to EMS arrival at 200, EMS had cbg on arrival as 23, pt given D50 by EMS and last cbg at 138 at  2033. NH reports pt as having poor PO intake, was given 15 units of insulin at ~ 1900. Pt arrives alert, passively interactive per norm, mild repetitive moaning, NAD, calm, interactive, skin W&D, resps e/u. Rectal temp on arrival to ED 95.7R. MAEx4, LS CTA, follows some commands. No obvious injuries or specific pains. Pt fearful with staff involvement and needs explanations and reassuring. Also: h/o 1 sz (first)~ 3 weeks ago.

## 2011-06-12 NOTE — ED Notes (Signed)
RN, EMT & PBT at [facility] for ~ 45 minutes, gown linens diaper changed, pt cleaned, NSL started after 3rd attempt, IVF switched to new IV site, BC x2, cbg re-checked, cbg dropped, EDP aware, will re-check in 15 min. Pt restless and aggitated throughout changes, cleaning and procedures. Easily re-assured when finished. Son at [employer].

## 2011-06-13 ENCOUNTER — Encounter (HOSPITAL_COMMUNITY): Payer: Self-pay | Admitting: Family Medicine

## 2011-06-13 DIAGNOSIS — E11649 Type 2 diabetes mellitus with hypoglycemia without coma: Secondary | ICD-10-CM | POA: Diagnosis present

## 2011-06-13 LAB — HEMOGLOBIN A1C
Hgb A1c MFr Bld: 8.6 % — ABNORMAL HIGH (ref <5.7)
Mean Plasma Glucose: 200 mg/dL — ABNORMAL HIGH (ref <117)

## 2011-06-13 LAB — GLUCOSE, CAPILLARY
Glucose-Capillary: 193 mg/dL — ABNORMAL HIGH (ref 70–99)
Glucose-Capillary: 226 mg/dL — ABNORMAL HIGH (ref 70–99)
Glucose-Capillary: 223 mg/dL — ABNORMAL HIGH (ref 70–99)
Glucose-Capillary: 132 mg/dL — ABNORMAL HIGH (ref 70–99)

## 2011-06-13 MED ORDER — ONDANSETRON HCL 4 MG/2ML IJ SOLN: 4.0000 mg | Freq: Four times a day (QID) | INTRAMUSCULAR | Status: DC | PRN

## 2011-06-13 MED ORDER — LORAZEPAM 2 MG/ML IJ SOLN
0.5000 mg | Freq: Once | INTRAMUSCULAR | Status: AC
  Filled 2011-06-13: qty 1

## 2011-06-13 MED ORDER — PNEUMOCOCCAL VAC POLYVALENT 25 MCG/0.5ML IJ INJ
0.5000 mL | INJECTION | INTRAMUSCULAR | Status: AC
  Administered 2011-06-14: 0.5 mL via INTRAMUSCULAR
  Filled 2011-06-13: qty 0.5

## 2011-06-13 MED ORDER — KCL IN DEXTROSE-NACL 20-5-0.45 MEQ/L-%-% IV SOLN
INTRAVENOUS | Status: DC
  Administered 2011-06-13: 02:00:00 via INTRAVENOUS
  Filled 2011-06-13 (×2): qty 1000

## 2011-06-13 MED ORDER — INSULIN GLARGINE 100 UNIT/ML ~~LOC~~ SOLN
10.0000 [IU] | Freq: Two times a day (BID) | SUBCUTANEOUS | Status: DC
  Administered 2011-06-13 – 2011-06-15 (×5): 10 [IU] via SUBCUTANEOUS

## 2011-06-13 MED ORDER — ALUM & MAG HYDROXIDE-SIMETH 200-200-20 MG/5ML PO SUSP
30.0000 mL | Freq: Four times a day (QID) | ORAL | Status: DC | PRN
  Filled 2011-06-13: qty 30

## 2011-06-13 MED ORDER — INSULIN ASPART 100 UNIT/ML ~~LOC~~ SOLN
0.0000 [IU] | Freq: Three times a day (TID) | SUBCUTANEOUS | Status: DC
  Administered 2011-06-13 (×2): 3 [IU] via SUBCUTANEOUS
  Administered 2011-06-14: 2 [IU] via SUBCUTANEOUS
  Administered 2011-06-14: 1 [IU] via SUBCUTANEOUS
  Administered 2011-06-15 (×2): 5 [IU] via SUBCUTANEOUS

## 2011-06-13 MED ORDER — DONEPEZIL HCL 5 MG PO TABS
5.0000 mg | ORAL_TABLET | Freq: Every day | ORAL | Status: DC
  Administered 2011-06-14 – 2011-06-15 (×2): 5 mg via ORAL
  Filled 2011-06-13 (×3): qty 1

## 2011-06-13 MED ORDER — INSULIN ASPART 100 UNIT/ML ~~LOC~~ SOLN
0.0000 [IU] | SUBCUTANEOUS | Status: DC
  Administered 2011-06-13: 3 [IU] via SUBCUTANEOUS

## 2011-06-13 MED ORDER — ONDANSETRON HCL 4 MG PO TABS: 4.0000 mg | ORAL_TABLET | Freq: Four times a day (QID) | ORAL | Status: DC | PRN

## 2011-06-13 MED ORDER — LORAZEPAM 2 MG/ML IJ SOLN
INTRAMUSCULAR | Status: AC
  Administered 2011-06-13: 0.5 mg
  Filled 2011-06-13: qty 1

## 2011-06-13 MED ORDER — ISOSORBIDE MONONITRATE 20 MG PO TABS
20.0000 mg | ORAL_TABLET | Freq: Two times a day (BID) | ORAL | Status: DC
  Administered 2011-06-14 – 2011-06-15 (×4): 20 mg via ORAL
  Filled 2011-06-13 (×7): qty 1

## 2011-06-13 MED ORDER — CILOSTAZOL 100 MG PO TABS
100.0000 mg | ORAL_TABLET | Freq: Two times a day (BID) | ORAL | Status: DC
  Administered 2011-06-14 – 2011-06-15 (×4): 100 mg via ORAL
  Filled 2011-06-13 (×7): qty 1

## 2011-06-13 MED ORDER — ENOXAPARIN SODIUM 40 MG/0.4ML ~~LOC~~ SOLN
40.0000 mg | SUBCUTANEOUS | Status: DC
  Filled 2011-06-13: qty 0.4

## 2011-06-13 MED ORDER — HYDRALAZINE HCL 10 MG PO TABS
10.0000 mg | ORAL_TABLET | Freq: Three times a day (TID) | ORAL | Status: DC
  Administered 2011-06-13 – 2011-06-15 (×6): 10 mg via ORAL
  Filled 2011-06-13 (×10): qty 1

## 2011-06-13 NOTE — ED Notes (Signed)
Pt sleeping, son at [facility], pending orders and bed assignment.

## 2011-06-13 NOTE — ED Notes (Signed)
Up with EMT, no changes.

## 2011-06-13 NOTE — ED Notes (Signed)
Admitting MD at [facility] speaking with family.

## 2011-06-13 NOTE — H&P (Signed)
PCP:   Alva Garnet., MD, MD   Chief Complaint:  Hypoglycemia  HPI: This is a 78 year old female resident's of [facility]. Today while at the center she lost consciousness and slide out of her wheelchair. Fingerstick blood sugars were checked and it was 23. She received dextrose x3 after she's been transported to the ER. She is on a dextrose drip. Apparently the patient received her full dose of Lantus insulin and she did not eat any dinner. The hospitalist has been called with request to admit. The patient's son Kumiko Fishman is present at the bedside, he is her power of attorney [phone]. History provided by the patient's son.  Review of Systems: Positives bolded   anorexia, fever, weight loss,, vision loss, decreased hearing, hoarseness, chest pain, syncope, dyspnea on exertion, peripheral edema, balance deficits, hemoptysis, abdominal pain, melena, hematochezia, severe indigestion/heartburn, hematuria, incontinence, genital sores, muscle weakness, suspicious skin lesions, transient blindness, difficulty walking, depression, unusual weight change, abnormal bleeding, enlarged lymph nodes, angioedema, and breast masses.  Past Medical History: Past Medical History  Diagnosis Date  . Hypertension   . Diabetes mellitus   . Encephalopathy, unspecified   . Urinary tract infection   . Dementia   . Prolapsed uterus   . Lumbar spinal stenosis    Past Surgical History  Procedure Date  . No past surgeries     Medications: Prior to Admission medications   Medication Sig Start Date End Date Taking? Authorizing Provider  acetaminophen (TYLENOL) 500 MG tablet Take 500 mg by mouth 3 (three) times daily.   Yes Historical Provider, MD  cephALEXin (KEFLEX) 500 MG capsule Take 500 mg by mouth 4 (four) times daily.   Yes Historical Provider, MD  cilostazol (PLETAL) 100 MG tablet Take 100 mg by mouth 2 (two) times daily.     Yes Historical Provider, MD  diphenhydrAMINE (SOMINEX) 25  MG tablet Take 25 mg by mouth 2 (two) times daily.   Yes Historical Provider, MD  donepezil (ARICEPT) 5 MG tablet Take 5 mg by mouth daily.    Yes Historical Provider, MD  fluconazole (DIFLUCAN) 150 MG tablet Take 150 mg by mouth once.   Yes Historical Provider, MD  hydrALAZINE (APRESOLINE) 10 MG tablet Take 1 tablet (10 mg total) by mouth 3 (three) times daily. 05/27/11 05/26/12 Yes Ripudeep Jenna Luo, MD  insulin glargine (LANTUS) 100 UNIT/ML injection Inject 10-45 Units into the skin at bedtime. Per sliding scale. 10 units in the AM and 45 units at bedtime per [facility] 05/27/11 05/26/12 Yes Ripudeep K Rai, MD  isosorbide mononitrate (ISMO,MONOKET) 20 MG tablet Take 20 mg by mouth 2 (two) times daily. Space doses by at least (7) hours.    Yes Historical Provider, MD    Allergies:  No Known Allergies  Social History:  reports that she has never smoked. She does not have any smokeless tobacco history on file. She reports that she does not drink alcohol or use illicit drugs.primary uses a wheelchair   Family History: No family history on file.  Physical Exam: Filed Vitals:   06/12/11 2100 06/12/11 2104 06/12/11 2338 06/13/11 0001  BP:  123/43 138/55 108/36  Pulse: 70 68    Temp:  95.7 F (35.4 C)    TempSrc:  Oral    Resp:  18    SpO2:  95%      General:  Alert, sleepy female, easily arousable, well developed and nourished, no acute distress Eyes: PERRLA, pink conjunctiva, no scleral icterus ENT: Moist  oral mucosa, neck supple, no thyromegaly Lungs: clear to ascultation, no wheeze, no crackles, no use of accessory muscles Cardiovascular: regular rate and rhythm, no regurgitation, no gallops, no murmurs. No carotid bruits, no JVD Abdomen: soft, positive BS, non-tender, non-distended, no organomegaly, not an acute abdomen GU: not examined Neuro: CN II - XII grossly intact, sensation intact Musculoskeletal: strength 5/5 all extremities, no clubbing, cyanosis or edema Skin: no rash, no  subcutaneous crepitation, no decubitus Psych: appropriate patient   Labs on Admission:   [facility] 06/12/11 2138  NA 142  K 3.5  CL 107  CO2 25  GLUCOSE 139*  BUN 11  CREATININE 0.69  CALCIUM 9.1  MG --  PHOS --   No results found for this basename: AST:2,ALT:2,ALKPHOS:2,BILITOT:2,PROT:2,ALBUMIN:2 in the last 72 hours No results found for this basename: LIPASE:2,AMYLASE:2 in the last 72 hours  Basename 06/12/11 2138  WBC 4.9  NEUTROABS 3.9  HGB 11.4*  HCT 34.3*  MCV 90.0  PLT 170   No results found for this basename: CKTOTAL:3,CKMB:3,CKMBINDEX:3,TROPONINI:3 in the last 72 hours No components found with this basename: POCBNP:3 No results found for this basename: DDIMER:2 in the last 72 hours No results found for this basename: HGBA1C:2 in the last 72 hours No results found for this basename: CHOL:2,HDL:2,LDLCALC:2,TRIG:2,CHOLHDL:2,LDLDIRECT:2 in the last 72 hours No results found for this basename: TSH,T4TOTAL,FREET3,T3FREE,THYROIDAB in the last 72 hours No results found for this basename: VITAMINB12:2,FOLATE:2,FERRITIN:2,TIBC:2,IRON:2,RETICCTPCT:2 in the last 72 hours  Micro Results: No results found for this or any previous visit (from the past 240 hour(s)).   Radiological Exams on Admission: Ct Head Wo Contrast  06/13/2011  *RADIOLOGY REPORT*  Clinical Data: Status post fall, hyperglycemia, confusion.  CT HEAD WITHOUT CONTRAST  Technique:  Contiguous axial images were obtained from the base of the skull through the vertex without contrast.  Comparison: 05/23/2011 MRI, 05/22/2011 CT  Findings: Ventriculomegaly is similar to prior. Prominence of the sulci, cisterns, and ventricles, in keeping with volume loss. There are subcortical and periventricular white matter hypodensities, a nonspecific finding most often seen with chronic microangiopathic changes.  There is no evidence for acute hemorrhage, overt hydrocephalus, mass lesion, or abnormal extra-axial fluid collection.   No definite CT evidence for acute cortical based (large artery) infarction. Left maxillary sinus mucous retention cyst.  Partially opacified right sphenoid chamber.  Mastoid air cells are clear.  No displaced calvarial fracture.  IMPRESSION: Volume loss/ventriculomegaly is similar to prior.  White matter hypodensities, a nonspecific finding often seen with chronic microangiopathic change.  Original Report Authenticated By: Waneta Martins, M.D.   Dg Chest Portable 1 View  06/12/2011  *RADIOLOGY REPORT*  Clinical Data: Hypoglycemia, post fall  PORTABLE CHEST - 1 VIEW  Comparison: 05/22/2011  Findings: Grossly unchanged borderline enlarged cardiac silhouette and mediastinal contours with apparent differences likely secondary to decreased lung volumes and technique.  There is persistent mild elevation of the right hemidiaphragm and right basilar heterogeneous opacities.  No new focal airspace opacities.  No definite pleural effusion or pneumothorax.  Grossly changed bones including moderate to severe left-sided glenohumeral joint degenerative change.  IMPRESSION: Decreased lung volume without acute cardiopulmonary disease.  Original Report Authenticated By: Waynard Reeds, M.D.    Assessment/Plan Present on Admission:  .Hypoglycemia associated with diabetes Admit to MedSurg  Dextrose infusion Sliding scale insulin every 4 hours Likely etiology inadequate by mouth intake. Home insulin dosage held Detailed discussion with son, and he thought fluctuations in her sugars were better controlled when she was  on 70/30 Dementia .Hypertension  stable resume home medications  Full code DVT prophylaxis Team 2/Dr. Nadara Mustard, Kaine Mcquillen 06/13/2011, 12:25 AM

## 2011-06-13 NOTE — ED Provider Notes (Signed)
  I performed a history and physical examination of Kristi Snow and discussed her management with Dr. Vear Clock.  I agree with the history, physical, assessment, and plan of care, with the following exceptions: None  Hypoglycemia with fall from wheelchair.  Took lantus dose with no eating this evening.  Found to have glucose of 20's.  Received d50 and improved.  Dementia.  Will require admission as has required dextrose gtt.  Kristi Snow    Kristi Bailiff, MD 06/13/11 0010

## 2011-06-13 NOTE — Progress Notes (Signed)
   CARE MANAGEMENT NOTE 06/13/2011  Patient:  Kristi Snow, Kristi Snow   Account Number:  [id number]  Date Initiated:  06/13/2011  Documentation initiated by:  Letha Cape  Subjective/Objective Assessment:   dx hypoglycemia  admit- from ALF.  Casimiro Needle (son) [phone].     Action/Plan:   pt/ot eval.   Anticipated DC Date:  06/14/2011   Anticipated DC Plan:  ASSISTED LIVING / REST HOME  In-house referral  Clinical Social Worker      DC Planning Services  CM consult      Choice offered to / List presented to:             Status of service:  In process, will continue to follow Medicare Important Message given?   (If response is "NO", the following Medicare IM given date fields will be blank) Date Medicare IM given:   Date Additional Medicare IM given:    Discharge Disposition:    Per UR Regulation:    If discussed at Long Length of Stay Meetings, dates discussed:    Comments:  06/13/11 10:41 Letha Cape RN, BSN [phone] patient is from ALF. Await pt/ot eval.  CSW referral.  Left vm for Casimiro Needle to call me with ALF information.

## 2011-06-13 NOTE — Progress Notes (Signed)
Patient still refusing to have vital signs be checked,combative when attempted.Pt incontinent of urine,had to have 4 people to assist in changing bed linens as pt is combative. Saralee Bolick Joselita,RN

## 2011-06-13 NOTE — Progress Notes (Signed)
Pt is incontinent of urine and does not know when she needs to go. Refuses to use the bedpan or bedside commode. Will continue to attempt to collect urine sample.

## 2011-06-13 NOTE — Progress Notes (Signed)
Utilization review completed.  

## 2011-06-13 NOTE — Evaluation (Signed)
Physical Therapy Evaluation Patient Details Name: Kristi Snow MRN: [id number] DOB: Apr 07, 1924 Today's Date: 06/13/2011 Time: 4098-1191 PT Time Calculation (min): 18 min  PT Assessment / Plan / Recommendation Clinical Impression  78 y/o female admitted to [facility] following hypoglycemic event. Today pt likely close to baseline however will benefit physical therapy in the acute setting to address strength and mobility deficits so as to decrease burden of care at next venue. Rec SNF level therapies for follow up.     PT Assessment  Patient needs continued PT services    Follow Up Recommendations  Skilled nursing facility    Equipment Recommendations  Defer to next venue    Frequency Min 2X/week    Precautions / Restrictions Precautions Precautions: Fall Precaution Comments: history of dementia, lives in memory care unit         Mobility  Bed Mobility Bed Mobility: Supine to Sit;Sitting - Scoot to [place] of Bed;Rolling Right;Rolling Left Supine to Sit: 7: Independent Sitting - Scoot to [place] of Bed: 4: Min assist Details for Bed Mobility Assistance: pt rolling independently in the bed (with use of rails) prior to me coming into her room, however when asked to roll pt resistant so min-modA to roll for pericare as pt saturated with urine; with nuring tech assistance bed was changed and pt cleaned; pt sat up long sitting independently; required minA to bring legs around EOB (use of pad to scoot)  Transfers Transfers: Sit to Stand;Stand to Sit;Stand Pivot Transfers Sit to Stand: 1: +2 Total assist Sit to Stand: Patient Percentage: 60% Stand to Sit: 1: +2 Total assist Stand to Sit: Patient Percentage: 60% Stand Pivot Transfers: 1: +2 Total assist Stand Pivot Transfers: Patient Percentage: 60% Details for Transfer Assistance: pt stood with bilateral upper extremity assist and facilitation at hips for follow through; pivoted with bilateral upper extremity assist and facilitation at trunk/hips  to finish pivoting (pt resistant to cooperation with therapist and almost sat on the floor); to sit the therapist guided pt's hips to the chair (with sitter's help) Ambulation/Gait Ambulation/Gait Assistance: Not tested (comment) (pt uncooperative)    Exercises     PT Goals Acute Rehab PT Goals PT Goal Formulation: Patient unable to participate in goal setting Time For Goal Achievement: 06/27/11 Potential to Achieve Goals: Fair Pt will go Supine/Side to Sit: with supervision PT Goal: Supine/Side to Sit - Progress: Goal set today Pt will Sit at Edge of Bed: with supervision PT Goal: Sit at Edge Of Bed - Progress: Goal set today Pt will go Sit to Supine/Side: with supervision PT Goal: Sit to Supine/Side - Progress: Goal set today Pt will go Sit to Stand: with supervision PT Goal: Sit to Stand - Progress: Goal set today Pt will go Stand to Sit: with supervision PT Goal: Stand to Sit - Progress: Goal set today Pt will Transfer Bed to Chair/Chair to Bed: with min assist PT Transfer Goal: Bed to Chair/Chair to Bed - Progress: Goal set today Pt will Ambulate: 16 - 50 feet;with least restrictive assistive device;with min assist PT Goal: Ambulate - Progress: Goal set today  Visit Information  Last PT Received On: 06/13/11 Assistance Needed: +2    Subjective Data  Subjective: No, because I don't want to. (in response to walking) Patient Stated Goal: unable to assess secondary to cognitive status   Prior Functioning  Home Living Type of Home: Assisted living (memory care unit) Home Access: Level entry Home Layout: One level    Cognition  Overall Cognitive  Status: History of cognitive impairments - at baseline Arousal/Alertness: Awake/alert Orientation Level: Disoriented to;Place;Time;Situation Behavior During Session: Agitated Current Attention Level: Selective    Extremity/Trunk Assessment     Balance Static Sitting Balance Static Sitting - Balance Support: No upper extremity  supported Static Sitting - Level of Assistance: 5: Stand by assistance  End of Session PT - End of Session Equipment Utilized During Treatment: Gait belt Activity Tolerance: Treatment limited secondary to agitation Patient left: in chair;with call bell/phone within reach;Other (comment) (sitter present)   [facility] HELEN 06/13/2011, 5:19 PM

## 2011-06-13 NOTE — Progress Notes (Addendum)
Patient Demographics  Kristi Snow, is a 78 y.o. female  ZOX:[id number]  [id number]:[id number]  DOB - 1924/03/26  Admit date - 06/12/2011  Admitting Physician Gery Pray, MD  Outpatient Primary MD for the patient is Alva Garnet., MD, MD  LOS - 1     Chief Complaint  Patient presents with  . Fall  . Hypoglycemia        Subjective:   Kristi Snow today has, No headache, No chest pain, No abdominal pain - No Nausea, No new weakness tingling or numbness, No Cough - SOB.  Patient is an unreliable historian due to baseline dementia which appears to be advanced.  Objective:   Filed Vitals:   06/13/11 0001 06/13/11 0055 06/13/11 0138 06/13/11 0710  BP: 108/36 127/54  173/74  Pulse:  86  85  Temp:  96.4 F (35.8 C)  99 F (37.2 C)  TempSrc:  Rectal  Axillary  Resp:  18  19  Weight:   69.4 kg (153 lb)   SpO2:  96%  100%    Wt Readings from Last 3 Encounters:  06/13/11 69.4 kg (153 lb)  05/26/11 66.8 kg (147 lb 4.3 oz)  03/09/11 68.6 kg (151 lb 3.8 oz)     Intake/Output Summary (Last 24 hours) at 06/13/11 2130 Last data filed at 06/13/11 0714  Gross per 24 hour  Intake 886.25 ml  Output      0 ml  Net 886.25 ml    Exam Awake , not oriented to time place or person, No new F.N deficits, Normal affect [place].AT,PERRAL Supple Neck,No JVD, No cervical lymphadenopathy appriciated.  Symmetrical Chest wall movement, Good air movement bilaterally, CTAB RRR,No Gallops,Rubs or new Murmurs, No Parasternal Heave +ve B.Sounds, Abd Soft, Non tender, No organomegaly appriciated, No rebound -guarding or rigidity. No Cyanosis, Clubbing or edema, No new Rash or bruise.  Data Review  CBC  Lab 06/12/11 2138  WBC 4.9  HGB 11.4*  HCT 34.3*  PLT 170  MCV 90.0  MCH 29.9  MCHC 33.2  RDW 14.1  LYMPHSABS 0.6*  MONOABS 0.4  EOSABS 0.0  BASOSABS 0.0  BANDABS --    Chemistries   Lab 06/12/11 2138  NA 142  K 3.5  CL 107  CO2 25  GLUCOSE 139*  BUN 11  CREATININE 0.69    CALCIUM 9.1  MG --  AST --  ALT --  ALKPHOS --  BILITOT --   ------------------------------------------------------------------------------------------------------------------ CrCl is unknown because both a height and weight (above a minimum accepted value) are required for this calculation. ------------------------------------------------------------------------------------------------------------------ No results found for this basename: HGBA1C:2 in the last 72 hours ------------------------------------------------------------------------------------------------------------------ No results found for this basename: CHOL:2,HDL:2,LDLCALC:2,TRIG:2,CHOLHDL:2,LDLDIRECT:2 in the last 72 hours ------------------------------------------------------------------------------------------------------------------ No results found for this basename: TSH,T4TOTAL,FREET3,T3FREE,THYROIDAB in the last 72 hours ------------------------------------------------------------------------------------------------------------------ No results found for this basename: VITAMINB12:2,FOLATE:2,FERRITIN:2,TIBC:2,IRON:2,RETICCTPCT:2 in the last 72 hours  Coagulation profile No results found for this basename: INR:5,PROTIME:5 in the last 168 hours  No results found for this basename: DDIMER:2 in the last 72 hours  Cardiac Enzymes No results found for this basename: CK:3,CKMB:3,TROPONINI:3,MYOGLOBIN:3 in the last 168 hours ------------------------------------------------------------------------------------------------------------------ No components found with this basename: POCBNP:3  Micro Results No results found for this or any previous visit (from the past 240 hour(s)).  Radiology Reports Ct Head Wo Contrast  06/13/2011  *RADIOLOGY REPORT*  Clinical Data: Status post fall, hyperglycemia, confusion.  CT HEAD WITHOUT CONTRAST  Technique:  Contiguous axial images were obtained from the base of the skull through the  vertex without contrast.  Comparison: 05/23/2011 MRI, 05/22/2011 CT  Findings: Ventriculomegaly is similar to prior. Prominence of the sulci, cisterns, and ventricles, in keeping with volume loss. There are subcortical and periventricular white matter hypodensities, a nonspecific finding most often seen with chronic microangiopathic changes.  There is no evidence for acute hemorrhage, overt hydrocephalus, mass lesion, or abnormal extra-axial fluid collection.  No definite CT evidence for acute cortical based (large artery) infarction. Left maxillary sinus mucous retention cyst.  Partially opacified right sphenoid chamber.  Mastoid air cells are clear.  No displaced calvarial fracture.  IMPRESSION: Volume loss/ventriculomegaly is similar to prior.  White matter hypodensities, a nonspecific finding often seen with chronic microangiopathic change.  Original Report Authenticated By: Waneta Martins, M.D.   Ct Head Wo Contrast  05/22/2011  *RADIOLOGY REPORT*  Clinical Data: Seizure  CT HEAD WITHOUT CONTRAST  Technique:  Contiguous axial images were obtained from the base of the skull through the vertex without contrast.  Comparison: None.  Findings: Moderate ventricular enlargement involving the third and lateral ventricles.  Hypodensity in the periventricular white matter, this could be due to transependymal resorption of CSF or chronic ischemia.  No acute infarct.  Negative for hemorrhage or mass lesion.  Mild chronic sinusitis.  No acute skull abnormality.  Scaphocephaly of the calvarium is noted.  IMPRESSION: Moderate ventricular enlargement of the third and  lateral ventricles.  This may indicate obstructive hydrocephalous. Communicating hydrocephalus is also possible.  Comparison with prior imaging is suggested to determine stability.  If no prior studies are available, MRI may be helpful.  Original Report Authenticated By: Camelia Phenes, M.D.   Mr Brain Wo Contrast  05/23/2011  *RADIOLOGY REPORT*  Clinical  Data: New onset seizure.  The examination had to be discontinued prior to completion due to patient refused further imaging.  She had been given ativan twice.  MRI HEAD WITHOUT CONTRAST  Technique:  Multiplanar, multiecho pulse sequences of the brain and surrounding structures were obtained according to standard protocol without intravenous contrast.  Comparison: CT head without contrast 05/22/2011.  Findings: The diffusion weighted images demonstrate no evidence for acute or subacute infarction.  Ventricular enlargement is again noted.  No obstructing lesion is evident.  There is some patient motion and it is difficult to know whether the aqueduct of Sylvius is patent.  Moderate periventricular and scattered subcortical T2 and FLAIR hyperintensities are present bilaterally.  Moderate generalized atrophy is present.  Flow is present in the major intracranial arteries.  The patient is status post bilateral lens extractions.  The globes and orbits are otherwise intact.  Scattered mucosal thickening is present throughout the paranasal sinuses.  The mastoid air cells are clear.  IMPRESSION:  1.  No acute or focal lesion to account for the patient's seizure. 2.  Mild ventricular enlargement.  This may represent a normal pressure hydrocephalus.  A fair amount of atrophy is present as well.  It is unclear hydrocephalus is evident.  No obstructing lesion is present. 3.  Moderate atrophy white matter disease.  This likely reflects the sequelae of chronic microvascular ischemia. 4.  Mild mucosal thickening throughout the paranasal sinuses.  No fluid is present.  Original Report Authenticated By: Jamesetta Orleans. MATTERN, M.D.   Dg Chest Portable 1 View  06/12/2011  *RADIOLOGY REPORT*  Clinical Data: Hypoglycemia, post fall  PORTABLE CHEST - 1 VIEW  Comparison: 05/22/2011  Findings: Grossly unchanged borderline enlarged cardiac silhouette and mediastinal contours with apparent differences likely secondary to decreased lung  volumes and technique.  There is persistent mild elevation of the right hemidiaphragm and right basilar heterogeneous opacities.  No new focal airspace opacities.  No definite pleural effusion or pneumothorax.  Grossly changed bones including moderate to severe left-sided glenohumeral joint degenerative change.  IMPRESSION: Decreased lung volume without acute cardiopulmonary disease.  Original Report Authenticated By: Waynard Reeds, M.D.   Dg Chest Port 1 View  05/22/2011  *RADIOLOGY REPORT*  Clinical Data: Seizure, hyperglycemia  PORTABLE CHEST - 1 VIEW  Comparison: None.  Findings: Cardiomediastinal silhouette is unremarkable. Degenerative changes thoracic spine.  Extensive degenerative changes bilateral shoulders.  No acute infiltrate or pulmonary edema.  No diagnostic pneumothorax.  IMPRESSION: No active disease.  Degenerative changes thoracic spine and bilateral shoulders.  Original Report Authenticated By: Natasha Mead, M.D.    Scheduled Meds:   . cilostazol  100 mg Oral BID  . dextrose  50 mL Intravenous Once  . dextrose  1 ampule Intravenous Once  . donepezil  5 mg Oral Daily  . hydrALAZINE  10 mg Oral TID  . insulin aspart  0-9 Units Subcutaneous TID WC  . insulin glargine  10 Units Subcutaneous BID  . isosorbide mononitrate  20 mg Oral BID  . DISCONTD: enoxaparin  40 mg Subcutaneous Q24H  . DISCONTD: insulin aspart  0-15 Units Subcutaneous Q4H   Continuous Infusions:   . DISCONTD: dextrose 5 % and 0.45 % NaCl with KCl 20 mEq/L 75 mL/hr at 06/13/11 0217  . DISCONTD: dextrose 5 % and 0.45% NaCl 100 mL/hr at 06/12/11 2316   PRN Meds:.alum & mag hydroxide-simeth, ondansetron (ZOFRAN) IV, ondansetron  Assessment & Plan   1. Hypoglycemia associated with diabetes - due to receiving high-dose Lantus and then skipping dinner, will check A1c, CBG seems to be stable will DC D5 drip, will place on Lantus 10 subcutaneous twice a day only after patient eats her lunch and dinner to be held if  she does not eat, continue q. a.c. sliding scale. And monitor. May and revealed NovoLog if sugars stay high consistently.   CBG (last 3)   Basename 06/13/11 0730 06/13/11 0452 06/13/11 0039  GLUCAP 193* 168* 207*   Lab Results  Component Value Date   HGBA1C 9.1* 05/26/2011     2.Dementia - get seen by PT OT, speech, feeding assistance, fall and aspiration precautions. Home medications to continue.   3. Hypertension - continue home medications if blood pressure is consistently elevated we'll try and add low-dose Norvasc.   4. Diabetes mellitus -2. - As #1 above.   DVT Prophylaxis  Lovenox     Leroy Sea M.D on 06/13/2011 at 9:22 AM  [facility] Office  [phone]

## 2011-06-13 NOTE — Progress Notes (Signed)
Pt's level of cooperation waxes and wanes through the course of the day. At times, the pt will be calm and cooperative and take her medications and eat her meals. At other times, the pt will refuse her medications and be combative. Pt currently has a Recruitment consultant, because this morning she was trying repeatedly to get out of bed, and was verbally and physically abusive with the nursing staff. Pt is a high fall risk. Will continue to use the safety sitter for as long as the pt needs it.

## 2011-06-13 NOTE — Progress Notes (Signed)
Received patient from ED,admitted due to fall and hypoglycemia.Patient is alert,disoriented x4,patient can get combative at times.IVF infusing to RFA.Patient refusing vital signs check and care at this time.Skin is dry and intact except for callus on rt heel and side of rt great toe.Patient refused to have EKG done and refused all p.o meds.Patient is from [facility].Will continue to monitor. Torryn Fiske Joselita,RN

## 2011-06-13 NOTE — Progress Notes (Signed)
Patient refused lab draw this am,Lab Tech to reattempt blood draw later. Hannan Tetzlaff Joselita,RN

## 2011-06-13 NOTE — Evaluation (Signed)
Clinical/Bedside Swallow Evaluation Patient Details  Name: Kristi Snow MRN: [id number] Date of Birth: 19-Feb-1924  Today's Date: 06/13/2011 Time: 1445-1500 SLP Time Calculation (min): 15 min  Past Medical History:  Past Medical History  Diagnosis Date  . Hypertension   . Diabetes mellitus   . Encephalopathy, unspecified   . Urinary tract infection   . Dementia   . Prolapsed uterus   . Lumbar spinal stenosis    Past Surgical History:  Past Surgical History  Procedure Date  . No past surgeries    HPI:  Pt is an 78 year old female admitted with diabetic crisis. Pt consumes regular diet, thin liquids at skilled nursing facility but sometimes does eat dinner. Pt with advanced dementia at baseline.    Assessment / Plan / Recommendation Clinical Impression  Pt presents with normal oral and oropharyngeal function. Pt is able to masticate regular foods without difficulty and RN and sitter report pt cleaned her plate at breakfast and lunch without any difficutly. Recommend pt continue a regular diet with thin liquids, no f/u needed.     Aspiration Risk  None    Diet Recommendation Regular;Thin liquid   Other  Recommendations     Follow Up Recommendations  None    Frequency and Duration        Pertinent Vitals/Pain None    SLP Swallow Goals     Swallow Study Prior Functional Status  Type of Home: Assisted living (memory care unit)    General HPI: Pt is an 78 year old female admitted with diabetic crisis. Pt consumes regular diet, thin liquids at skilled nursing facility but sometimes does eat dinner. Pt with advanced dementia at baseline.  Type of Study: Bedside swallow evaluation Diet Prior to this Study: Regular;Thin liquids Temperature Spikes Noted: No Respiratory Status: Room air Behavior/Cognition: Alert;Cooperative;Pleasant mood;Confused Oral Cavity - Dentition: Adequate natural dentition Vision: Functional for self-feeding Patient Positioning: Upright in  chair Baseline Vocal Quality: Clear Volitional Cough: Cognitively unable to elicit Volitional Swallow: Unable to elicit    Oral/Motor/Sensory Function Overall Oral Motor/Sensory Function: Appears within functional limits for tasks assessed   Ice Chips     Thin Liquid Thin Liquid: Within functional limits Presentation: Cup;Self Fed;Straw    Nectar Thick Nectar Thick Liquid: Not tested   Honey Thick Honey Thick Liquid: Not tested   Puree Puree: Within functional limits   Solid Solid: Within functional limits    Zeus Marquis, Riley Nearing 06/13/2011,3:57 PM

## 2011-06-13 NOTE — Progress Notes (Signed)
Clinical Social Work Department BRIEF PSYCHOSOCIAL ASSESSMENT 06/13/2011  Patient:  Kristi Snow, Kristi Snow     Account Number:  [id number]     Admit date:  06/12/2011  Clinical Social Worker:  Skip Mayer  Date/Time:  06/13/2011 03:10 PM  Referred by:  Physician  Date Referred:  06/13/2011 Referred for  SNF Placement   Other Referral:   Interview type:  Family Other interview type:    PSYCHOSOCIAL DATA Living Status:  FACILITY Admitted from facility:   Level of care:  Skilled Nursing Facility Primary support name:  Casimiro Needle Primary support relationship to patient:  CHILD, ADULT Degree of support available:   Adequate    CURRENT CONCERNS Current Concerns  Post-Acute Placement   Other Concerns:    SOCIAL WORK ASSESSMENT / PLAN Pt admitted from [facility] and is able to return at d/c, per [place] at [facility]. CSW spoke with pt's son, Casimiro Needle, via phone who reports plan is for pt to return, but would like to have a care plan mtg with [facility] in near future if not prior to pt return.   Assessment/plan status:  Information/Referral to [facility] Other assessment/ plan:   Information/referral to community resources:   SNF    PATIENT'S/FAMILY'S RESPONSE TO PLAN OF CARE: Pt not oriented and pt's son making decisions. Pt's son reports agreeable with pt return to [facility] at d/c.        Dellie Burns, MSW, LCSWA [phone] (covering)

## 2011-06-14 LAB — BASIC METABOLIC PANEL
Calcium: 9.2 mg/dL (ref 8.4–10.5)
GFR calc Af Amer: >90 mL/min (ref >90)
GFR calc non Af Amer: 81 mL/min — ABNORMAL LOW (ref >90)
Glucose, Bld: 156 mg/dL — ABNORMAL HIGH (ref 70–99)
Potassium: 4.1 mEq/L (ref 3.5–5.1)
Sodium: 142 mEq/L (ref 135–145)

## 2011-06-14 LAB — CBC
MCH: 29.1 pg (ref 26.0–34.0)
MCHC: 32.5 g/dL (ref 30.0–36.0)
Platelets: 240 10*3/uL (ref 150–400)
RDW: 13.9 % (ref 11.5–15.5)

## 2011-06-14 LAB — GLUCOSE, CAPILLARY
Glucose-Capillary: 106 mg/dL — ABNORMAL HIGH (ref 70–99)
Glucose-Capillary: 258 mg/dL — ABNORMAL HIGH (ref 70–99)

## 2011-06-14 MED ORDER — AMLODIPINE BESYLATE 10 MG PO TABS
10.0000 mg | ORAL_TABLET | Freq: Every day | ORAL | Status: DC
  Administered 2011-06-14 – 2011-06-15 (×2): 10 mg via ORAL
  Filled 2011-06-14 (×2): qty 1

## 2011-06-14 MED ORDER — INSULIN ASPART 100 UNIT/ML ~~LOC~~ SOLN: SUBCUTANEOUS | Status: DC

## 2011-06-14 MED ORDER — INSULIN GLARGINE 100 UNIT/ML ~~LOC~~ SOLN: 10.0000 [IU] | Freq: Two times a day (BID) | SUBCUTANEOUS | Status: DC

## 2011-06-14 MED ORDER — AMLODIPINE BESYLATE 10 MG PO TABS: 10.0000 mg | ORAL_TABLET | Freq: Every day | ORAL | Status: AC

## 2011-06-14 NOTE — Progress Notes (Signed)
Order received, chart reviewed, called and spoke to therapy staff at [facility]F [facility] Pt waxed and waned with therapy there of course depending on her cognitive status. Feel it would not be of any skilled benefit to start acute OT therapy, but rather let the staff at SNF start back with her once she returns there. Acute OT will sign off.  Ignacia Palma, [place] [phone] 06/14/2011

## 2011-06-14 NOTE — Progress Notes (Signed)
Spoke with patient's son,Michael who came to visit pt. regarding patient's order for restraints secondary to her increasing agitation and attempting to get OOB and our concern for pt's safety.Son is hesitant to give consent for restraints.Restraints not applied.Will continue to monitor. Eulala Newcombe Joselita,RN

## 2011-06-14 NOTE — Progress Notes (Signed)
Clinical Social Work:  Following for patient being admitted from a facility: [facility].  Patient was to return today per MD and CM, however patient has a sitter and facility will not accept patient back with sitter unless sitter is discharged for 24 hours.  MD made aware and spoke with nursing secretary to verify sitter.  If patient can be alone without sitter, will anticipate dc back to skilled nursing home tomorrow 06/15/2011.  Ashley Jacobs, MSW LCSW [phone]

## 2011-06-14 NOTE — Clinical Documentation Improvement (Deleted)
CHANGE MENTAL STATUS DOCUMENTATION CLARIFICATION   THIS DOCUMENT IS NOT A PERMANENT PART OF THE MEDICAL RECORD  TO RESPOND TO THE THIS QUERY, FOLLOW THE INSTRUCTIONS BELOW:  1. If needed, update documentation for the patient's encounter via the notes activity.  2. Access this query again and click edit on the In [employer].  3. After updating, or not, click F2 to complete all highlighted (required) fields concerning your review. Select "additional documentation in the medical record" OR "no additional documentation provided".  4. Click Sign note button.  5. The deficiency will fall out of your In Basket *Please let us know if you are not able to complete this workflow by phone or e-mail (listed below). Please update documentation within a progress note or discharge to reflect any responses or updates.          06/14/11  Dear Dr.Jewel Venditto Marton Redwood  In an effort to better capture your patient's severity of illness, reflect appropriate length of stay and utilization of resources, a review of the patient medical record has revealed the following indicators. Based on your clinical judgment, please clarify and document in a progress note and/or discharge summary the clinical condition associated with the following supporting information: In responding to this query please exercise your independent judgment.  The fact that a query is asked, does not imply that any particular answer is desired or expected.  Possible Clinical Conditions?   Encephalopathy (describe type if known)                       Metabolic        Acute delirium with behavioral disturbance  Acute exacerbation of known dementia (indicate type; if known)  Other Condition  Cannot Clinically Determine   Supporting Information:  Risk Factors:Patient is an unreliable historian due to baseline dementia which appears to be advanced.  Signs & Symptoms: per nursing notes: Patient is alert,disoriented x4,patient can get  combative at times.IVF infusing to RFA.Patient refusing vital signs check and care at this time. Patient still refusing to have vital signs be checked,combative when attempted.Pt incontinent of urine,had to have 4 people to assist in changing bed linens as pt is combative  Diagnostics: Lab: glucose: 54, 57 Radiology:Decreased lung volume without acute cardiopulmonary disease  CT scan Volume loss/ventriculomegaly is similar to prior. White matter hypodensities, a nonspecific finding often seen with chronic microangiopathic change  Treatment: sitter for safety; ativan; aricept  Reviewed:  no additional documentation provided  Thank You,  Carlena Sax, BSN, CCM    Clinical Documentation Specialist: [phone] [email or web address]   Health Information Management [place]

## 2011-06-14 NOTE — Discharge Instructions (Signed)
Follow with Primary MD Kristi Snow., MD, MD in 7 days   Accuchecks 4 times/day, Once in AM empty stomach and then before each meal. Log in all results and show them to your Prim.MD in 3 days. If any glucose reading is under 80 or above 300 call your Prim MD immidiately. Follow Low glucose instructions for glucose under 80 as instructed.  Get Medicines reviewed and adjusted.  Please request your Prim.MD to go over all Hospital Tests and Procedure/Radiological results at the follow up, please get all Hospital records sent to your Prim MD by signing hospital release before you go home.  Activity: Fall precautions use walker/cane & assistance as needed  Diet: Diabetic-Heart Healthy, Aspiration precautions.  For Heart failure patients - Check your Weight same time everyday, if you gain over 2 pounds, or you develop in leg swelling, experience more shortness of breath or chest pain, call your Primary MD immediately. Follow Cardiac Low Salt Diet and 1.8 lit/day fluid restriction.  Disposition Home  If you experience worsening of your admission symptoms, develop shortness of breath, life threatening emergency, suicidal or homicidal thoughts you must seek medical attention immediately by calling 911 or calling your MD immediately  if symptoms less severe.  You Must read complete instructions/literature along with all the possible adverse reactions/side effects for all the Medicines you take and that have been prescribed to you. Take any new Medicines after you have completely understood and accpet all the possible adverse reactions/side effects.

## 2011-06-14 NOTE — Discharge Summary (Signed)
Kristi Snow, 78 y.o., is a 78 y.o. female  DOB Jul 09, 1924  MRN [id number].  Admission date: 06/12/2011  Discharge Date 06/15/2011  Primary MD Alva Garnet., MD, MD  Admitting Physician Gery Pray, MD  Admission Diagnosis  Hypothermia [991.6] Hypoglycemia [251.2] cp   Discharge Diagnosis   Principal Problem:  *Hypoglycemia associated with diabetes Active Problems:  Dementia  Hypertension  Diabetes mellitus   Past Medical History  Diagnosis Date  . Hypertension   . Diabetes mellitus   . Encephalopathy, unspecified   . Urinary tract infection   . Dementia   . Prolapsed uterus   . Lumbar spinal stenosis     Past Surgical History  Procedure Date  . No past surgeries      Hospital Course See H&P, Labs, Consult and Test reports for all details in brief, patient was admitted for   1.Hypoglycemia associated with diabetes-do do not eating her supper but receiving her insulin does which was Lantus 20 units .her Lantus has been adjusted to step 10 units subcutaneous with meals and supper, please make note give insulin only after patient finishes her meals, if patient does not eat meals hold her Lantus. Also have added q. a.c. sliding scale NovoLog .continue to monitor Accu-Cheks closely and adjust insulin as needed . Her A1c recently was 9.1, however at her age and dementia permissive hyperglycemia is reasonable.  CBG (last 3)   Basename 06/14/11 1145 06/14/11 0753 06/13/11 2037  GLUCAP 125* 106* 132*      2.Advanced dementia - follow fall and aspiration precautions, provide patient with feeding assistance outpatient followup for dementia and home medications to continue    3.Hypertension have added Norvasc for better control continue to monitor and adjust medications .       Significant Tests:  See full reports for all details    Ct Head Wo Contrast  06/13/2011  *RADIOLOGY REPORT*  Clinical Data: Status post fall, hyperglycemia, confusion.  CT HEAD  WITHOUT CONTRAST  Technique:  Contiguous axial images were obtained from the base of the skull through the vertex without contrast.  Comparison: 05/23/2011 MRI, 05/22/2011 CT  Findings: Ventriculomegaly is similar to prior. Prominence of the sulci, cisterns, and ventricles, in keeping with volume loss. There are subcortical and periventricular white matter hypodensities, a nonspecific finding most often seen with chronic microangiopathic changes.  There is no evidence for acute hemorrhage, overt hydrocephalus, mass lesion, or abnormal extra-axial fluid collection.  No definite CT evidence for acute cortical based (large artery) infarction. Left maxillary sinus mucous retention cyst.  Partially opacified right sphenoid chamber.  Mastoid air cells are clear.  No displaced calvarial fracture.  IMPRESSION: Volume loss/ventriculomegaly is similar to prior.  White matter hypodensities, a nonspecific finding often seen with chronic microangiopathic change.  Original Report Authenticated By: Waneta Martins, M.D.   Ct Head Wo Contrast  05/22/2011  *RADIOLOGY REPORT*  Clinical Data: Seizure  CT HEAD WITHOUT CONTRAST  Technique:  Contiguous axial images were obtained from the base of the skull through the vertex without contrast.  Comparison: None.  Findings: Moderate ventricular enlargement involving the third and lateral ventricles.  Hypodensity in the periventricular white matter, this could be due to transependymal resorption of CSF or chronic ischemia.  No acute infarct.  Negative for hemorrhage or mass lesion.  Mild chronic sinusitis.  No acute skull abnormality.  Scaphocephaly of the calvarium is noted.  IMPRESSION: Moderate ventricular enlargement of the third and  lateral ventricles.  This may indicate obstructive hydrocephalous.  Communicating hydrocephalus is also possible.  Comparison with prior imaging is suggested to determine stability.  If no prior studies are available, MRI may be helpful.  Original  Report Authenticated By: Camelia Phenes, M.D.   Mr Brain Wo Contrast  05/23/2011  *RADIOLOGY REPORT*  Clinical Data: New onset seizure.  The examination had to be discontinued prior to completion due to patient refused further imaging.  She had been given ativan twice.  MRI HEAD WITHOUT CONTRAST  Technique:  Multiplanar, multiecho pulse sequences of the brain and surrounding structures were obtained according to standard protocol without intravenous contrast.  Comparison: CT head without contrast 05/22/2011.  Findings: The diffusion weighted images demonstrate no evidence for acute or subacute infarction.  Ventricular enlargement is again noted.  No obstructing lesion is evident.  There is some patient motion and it is difficult to know whether the aqueduct of Sylvius is patent.  Moderate periventricular and scattered subcortical T2 and FLAIR hyperintensities are present bilaterally.  Moderate generalized atrophy is present.  Flow is present in the major intracranial arteries.  The patient is status post bilateral lens extractions.  The globes and orbits are otherwise intact.  Scattered mucosal thickening is present throughout the paranasal sinuses.  The mastoid air cells are clear.  IMPRESSION:  1.  No acute or focal lesion to account for the patient's seizure. 2.  Mild ventricular enlargement.  This may represent a normal pressure hydrocephalus.  A fair amount of atrophy is present as well.  It is unclear hydrocephalus is evident.  No obstructing lesion is present. 3.  Moderate atrophy white matter disease.  This likely reflects the sequelae of chronic microvascular ischemia. 4.  Mild mucosal thickening throughout the paranasal sinuses.  No fluid is present.  Original Report Authenticated By: Jamesetta Orleans. MATTERN, M.D.   Dg Chest Portable 1 View  06/12/2011  *RADIOLOGY REPORT*  Clinical Data: Hypoglycemia, post fall  PORTABLE CHEST - 1 VIEW  Comparison: 05/22/2011  Findings: Grossly unchanged borderline  enlarged cardiac silhouette and mediastinal contours with apparent differences likely secondary to decreased lung volumes and technique.  There is persistent mild elevation of the right hemidiaphragm and right basilar heterogeneous opacities.  No new focal airspace opacities.  No definite pleural effusion or pneumothorax.  Grossly changed bones including moderate to severe left-sided glenohumeral joint degenerative change.  IMPRESSION: Decreased lung volume without acute cardiopulmonary disease.  Original Report Authenticated By: Waynard Reeds, M.D.   Dg Chest Port 1 View  05/22/2011  *RADIOLOGY REPORT*  Clinical Data: Seizure, hyperglycemia  PORTABLE CHEST - 1 VIEW  Comparison: None.  Findings: Cardiomediastinal silhouette is unremarkable. Degenerative changes thoracic spine.  Extensive degenerative changes bilateral shoulders.  No acute infiltrate or pulmonary edema.  No diagnostic pneumothorax.  IMPRESSION: No active disease.  Degenerative changes thoracic spine and bilateral shoulders.  Original Report Authenticated By: Natasha Mead, M.D.     Today   Subjective:   Kristi Snow today has no headache,no chest abdominal pain,no new weakness tingling or numbness, the patient is a very poor and unreliable historian.   Objective:   Blood pressure 160/77, pulse 72, temperature 97.6 F (36.4 C), temperature source Axillary, resp. rate 18, height 5\' 6"  (1.676 m), weight 66.7 kg (147 lb 0.8 oz), SpO2 100.00%.  Intake/Output Summary (Last 24 hours) at 06/14/11 1331 Last data filed at 06/14/11 0800  Gross per 24 hour  Intake    440 ml  Output    301 ml  Net  139 ml    Exam Awake but not alert has advanced dementia at baseline, No new F.N deficits,  [place].AT,PERRAL Supple Neck,No JVD, No cervical lymphadenopathy appriciated.  Symmetrical Chest wall movement, Good air movement bilaterally, CTAB RRR,No Gallops,Rubs or new Murmurs, No Parasternal Heave +ve B.Sounds, Abd Soft, Non tender, No  organomegaly appriciated, No rebound -guarding or rigidity. No Cyanosis, Clubbing or edema, No new Rash or bruise  Data Review      CBC w Diff: Lab Results  Component Value Date   WBC 4.3 06/14/2011   HGB 12.0 06/14/2011   HCT 36.9 06/14/2011   PLT 240 06/14/2011   LYMPHOPCT 13 06/12/2011   MONOPCT 9 06/12/2011   EOSPCT 0 06/12/2011   BASOPCT 0 06/12/2011   CMP: Lab Results  Component Value Date   NA 142 06/14/2011   K 4.1 06/14/2011   CL 104 06/14/2011   CO2 30 06/14/2011   BUN 8 06/14/2011   CREATININE 0.62 06/14/2011   PROT 7.7 05/22/2011   ALBUMIN 3.7 05/22/2011   BILITOT 0.4 05/22/2011   ALKPHOS 136* 05/22/2011   AST 13 05/22/2011   ALT 13 05/22/2011  .  Micro Results Recent Results (from the past 240 hour(s))  CULTURE, BLOOD (ROUTINE X 2)     Status: Normal (Preliminary result)   Collection Time   06/12/11 11:08 PM      Component Value Range Status Comment   Specimen Description BLOOD LEFT ARM   Final    Special Requests BOTTLES DRAWN AEROBIC ONLY 3CC   Final    Culture  Setup Time [id number]   Final    Culture     Final    Value:        BLOOD CULTURE RECEIVED NO GROWTH TO DATE CULTURE WILL BE HELD FOR 5 DAYS BEFORE ISSUING A FINAL NEGATIVE REPORT   Report Status PENDING   Incomplete   CULTURE, BLOOD (ROUTINE X 2)     Status: Normal (Preliminary result)   Collection Time   06/12/11 11:13 PM      Component Value Range Status Comment   Specimen Description BLOOD RIGHT ARM   Final    Special Requests     Final    Value: BOTTLES DRAWN AEROBIC AND ANAEROBIC 3CC BLUE 2CC RED   Culture  Setup Time [id number]   Final    Culture     Final    Value:        BLOOD CULTURE RECEIVED NO GROWTH TO DATE CULTURE WILL BE HELD FOR 5 DAYS BEFORE ISSUING A FINAL NEGATIVE REPORT   Report Status PENDING   Incomplete      Discharge Instructions     Follow with Primary MD Alva Garnet., MD, MD in 7 days   Accuchecks 4 times/day, Once in AM empty stomach and then before each meal. Log in  all results and show them to your Prim.MD in 3 days. If any glucose reading is under 80 or above 300 call your Prim MD immidiately. Follow Low glucose instructions for glucose under 80 as instructed.  Get Medicines reviewed and adjusted.  Please request your Prim.MD to go over all Hospital Tests and Procedure/Radiological results at the follow up, please get all Hospital records sent to your Prim MD by signing hospital release before you go home.  Activity: Fall precautions use walker/cane & assistance as needed  Diet: Diabetic-Heart Healthy, Aspiration precautions.  For Heart failure patients - Check your Weight same time everyday, if you gain over 2  pounds, or you develop in leg swelling, experience more shortness of breath or chest pain, call your Primary MD immediately. Follow Cardiac Low Salt Diet and 1.8 lit/day fluid restriction.  Disposition Home  If you experience worsening of your admission symptoms, develop shortness of breath, life threatening emergency, suicidal or homicidal thoughts you must seek medical attention immediately by calling 911 or calling your MD immediately  if symptoms less severe.  You Must read complete instructions/literature along with all the possible adverse reactions/side effects for all the Medicines you take and that have been prescribed to you. Take any new Medicines after you have completely understood and accpet all the possible adverse reactions/side effects.   Follow-up Information    Follow up with Alva Garnet., MD in 1 week.   Contact information:   [address] [place] [address] [phone]          Discharge Medications   Medication List  As of 06/14/2011  1:31 PM   START taking these medications         amLODipine 10 MG tablet   Commonly known as: NORVASC   Take 1 tablet (10 mg total) by mouth daily.      insulin aspart 100 UNIT/ML injection   Commonly known as: novoLOG   Before each meal 3  times a day,140-199 - 2 units, 200-250 - 4 units, 251-299 - 6 units, 300-349 - 8 units,  350 or above 10 units.         CHANGE how you take these medications         insulin glargine 100 UNIT/ML injection   Commonly known as: LANTUS   Inject 10 Units into the skin 2 (two) times daily. After eats Lunch and Supper, Hold if patient does not eat meals.   What changed: - dose - how often to take the med - doctor's instructions         CONTINUE taking these medications         acetaminophen 500 MG tablet   Commonly known as: TYLENOL      cephALEXin 500 MG capsule   Commonly known as: KEFLEX      cilostazol 100 MG tablet   Commonly known as: PLETAL      diphenhydrAMINE 25 MG tablet   Commonly known as: SOMINEX      donepezil 5 MG tablet   Commonly known as: ARICEPT      fluconazole 150 MG tablet   Commonly known as: DIFLUCAN      hydrALAZINE 10 MG tablet   Commonly known as: APRESOLINE   Take 1 tablet (10 mg total) by mouth 3 (three) times daily.      isosorbide mononitrate 20 MG tablet   Commonly known as: ISMO,MONOKET          Where to get your medications       Information on where to get these meds is not yet available. Ask your nurse or doctor.         amLODipine 10 MG tablet   insulin aspart 100 UNIT/ML injection   insulin glargine 100 UNIT/ML injection             Total Time in preparing paper work, data evaluation and todays exam - 35 minutes  Leroy Sea M.D on 06/14/2011 at 1:31 PM  [facility] Office  [phone]

## 2011-06-14 NOTE — Progress Notes (Signed)
Patient alert but very confused,irritable and attempting to get OOB a couple of times.Patient fell PTA at the Nursing facility and is unsteady on feet.Attempts to redirect and reorient patient to no avail.Call made by CN to on call MD and got order for ativan and restraints for pt safety.Will continue to monitor. Kristi Snow Joselita,RN

## 2011-06-15 LAB — GLUCOSE, CAPILLARY: Glucose-Capillary: 263 mg/dL — ABNORMAL HIGH (ref 70–99)

## 2011-06-15 NOTE — Progress Notes (Signed)
   CARE MANAGEMENT NOTE 06/15/2011  Patient:  Kristi Snow, Kristi Snow   Account Number:  [id number]  Date Initiated:  06/13/2011  Documentation initiated by:  Letha Cape  Subjective/Objective Assessment:   dx hypoglycemia  admit- from ALF.  Casimiro Needle (son) [phone].     Action/Plan:   pt/ot eval.   Anticipated DC Date:  06/15/2011   Anticipated DC Plan:  SKILLED NURSING FACILITY  In-house referral  Clinical Social Worker      DC Planning Services  CM consult      Choice offered to / List presented to:             Status of service:  Completed, signed off Medicare Important Message given?   (If response is "NO", the following Medicare IM given date fields will be blank) Date Medicare IM given:   Date Additional Medicare IM given:    Discharge Disposition:  SKILLED NURSING FACILITY  Per UR Regulation:    If discussed at Long Length of Stay Meetings, dates discussed:    Comments:  06/15/11 14:49 Letha Cape RN, BSN [phone] patient dc to snf today, CSW following.  06/13/11 10:41 Letha Cape RN, BSN [phone] patient is from ALF. Await pt/ot eval.  CSW referral.  Left vm for Casimiro Needle to call me with ALF information.

## 2011-06-15 NOTE — Progress Notes (Signed)
Physical Therapy Treatment Patient Details Name: Kristi Snow Full MRN: [id number] DOB: 11-Apr-1924 Today's Date: 06/15/2011 Time: 2130-8657 PT Time Calculation (min): 26 min  PT Assessment / Plan / Recommendation Comments on Treatment Session  Participating more, following simple commands; very pleasant, though confused; continue to agree with PT eval at SNF    Follow Up Recommendations  Skilled nursing facility    Equipment Recommendations  Defer to next venue    Frequency Min 2X/week   Plan Discharge plan remains appropriate;Frequency remains appropriate    Precautions / Restrictions Precautions Precautions: Fall Precaution Comments: history of dementia, lives in memory care unit Restrictions Weight Bearing Restrictions: No   Pertinent Vitals/Pain no apparent distress    Mobility  Transfers Transfers: Sit to Stand;Stand to Sit Sit to Stand: 3: Mod assist;With upper extremity assist;From chair/3-in-1 Stand to Sit: 3: Mod assist;With upper extremity assist;To chair/3-in-1 Details for Transfer Assistance: Pt very much wanting to stand up on her own; when initiating stnad, pt waved therapist and tech away, saying, "let me do it"; Good push up from armrests; cues to reach back for armrests prior to sitting and control descent to sit; Noted tendency to backwards lean Ambulation/Gait Ambulation/Gait Assistance: 1: +2 Total assist (second person present for safety/push chair) Ambulation/Gait: Patient Percentage: 80 Ambulation Distance (Feet): 15 Feet Assistive device: Rolling walker Ambulation/Gait Assistance Details: Good stepping, and good push down into RW for stability, which also helped to avoid posterior lean; Pt became incontinent of urine and stool during amb, so stopped amb early to clean up Gait Pattern: Decreased step length - right;Decreased step length - left         PT Goals Acute Rehab PT Goals PT Goal Formulation: Patient unable to participate in goal setting Time For  Goal Achievement: 06/27/11 Potential to Achieve Goals: Fair Pt will go Sit to Stand: with supervision PT Goal: Sit to Stand - Progress: Progressing toward goal Pt will go Stand to Sit: with supervision PT Goal: Stand to Sit - Progress: Progressing toward goal Pt will Ambulate: 16 - 50 feet;with least restrictive assistive device;with min assist PT Goal: Ambulate - Progress: Progressing toward goal  Visit Information  Last PT Received On: 06/15/11 Assistance Needed: +2 (for safety)    Subjective Data  Subjective: "well, let's go" re: walking Patient Stated Goal: unable to assess secondary to cognitive status   Cognition  Overall Cognitive Status: History of cognitive impairments - at baseline Arousal/Alertness: Awake/alert Orientation Level: Disoriented to;Place;Time;Situation Behavior During Session: [facility] for tasks performed Current Attention Level: Selective Cognition - Other Comments: Pleasant, requiring cues to attend to task, but following simple commands    Balance  Static Standing Balance Static Standing - Balance Support: Bilateral upper extremity supported Static Standing - Level of Assistance: 4: Min assist Static Standing - Comment/# of Minutes: Stood for 4-5 minutes for cleanup/hygeine  End of Session PT - End of Session Equipment Utilized During Treatment: Gait belt Activity Tolerance: Other (comment) (Treatment limited secondary to incontinence) Patient left: in chair (at nurses station) Nurse Communication: Mobility status    Olen Pel [place], [place] [phone]  06/15/2011, 10:29 AM

## 2011-06-15 NOTE — Progress Notes (Signed)
Patient seen and examined.  No specific new changes from prior.  Patient seated at nursing station.  Not oriented, to place or time, but only to person. BP 127/60  Pulse 88  Temp(Src) 97.1 F (36.2 C) (Oral)  Resp 18  Ht 5\' 6"  (1.676 m)  Wt 69.4 kg (153 lb)  BMI 24.69 kg/m2  SpO2 100% Alert eomi cta b abd soft nt/nd  NO specific changes from prior note as per Dr. Thedore Mins Can be d/c back to facility   Pleas Koch, MD [facility] (P) [phone]

## 2011-06-15 NOTE — Progress Notes (Signed)
Clinical Social Work:   Following patient for return to nursing home, where she is a long term resident.  Patient is from [facility] and will return today in which CSW has completed DC paperwork and arranged for ambulance transportation.  No other needs at this time.  Called patient son with regards to discharge, was unable to reach, thus CSW left message for son with regards to dc.  Patient is not alert or oriented, but pleasant.    No other needs at this time.  CSW signed off.  Ashley Jacobs, MSW LCSW [phone]

## 2011-06-19 LAB — CULTURE, BLOOD (ROUTINE X 2)
Culture  Setup Time: 201304290822
Culture  Setup Time: 201304290822
Culture: NO GROWTH

## 2011-06-20 ENCOUNTER — Telehealth: Payer: Self-pay

## 2011-06-20 NOTE — Telephone Encounter (Signed)
Pt son Para Cossey stated that pt is not doing well. Pt was in the hospital and is now in [facility] and he will not be able to make an appt to bring her in for Pessary change. He will contact us at a later date when pt will be able to come to the office. PG CMA

## 2011-06-23 ENCOUNTER — Emergency Department (HOSPITAL_COMMUNITY): Payer: Medicare Other

## 2011-06-23 ENCOUNTER — Emergency Department (HOSPITAL_COMMUNITY)
Admission: EM | Admit: 2011-06-23 | Discharge: 2011-06-23 | Disposition: A | Discharge status: Skilled Nursing Facility | Payer: Medicare Other | Attending: Emergency Medicine | Admitting: Emergency Medicine

## 2011-06-23 ENCOUNTER — Encounter (HOSPITAL_COMMUNITY): Payer: Self-pay

## 2011-06-23 DIAGNOSIS — F29 Unspecified psychosis not due to a substance or known physiological condition: Secondary | ICD-10-CM | POA: Insufficient documentation

## 2011-06-23 DIAGNOSIS — M25559 Pain in unspecified hip: Secondary | ICD-10-CM | POA: Insufficient documentation

## 2011-06-23 DIAGNOSIS — E119 Type 2 diabetes mellitus without complications: Secondary | ICD-10-CM | POA: Insufficient documentation

## 2011-06-23 DIAGNOSIS — W19XXXA Unspecified fall, initial encounter: Secondary | ICD-10-CM | POA: Insufficient documentation

## 2011-06-23 DIAGNOSIS — Z794 Long term (current) use of insulin: Secondary | ICD-10-CM | POA: Insufficient documentation

## 2011-06-23 DIAGNOSIS — T1490XA Injury, unspecified, initial encounter: Secondary | ICD-10-CM | POA: Insufficient documentation

## 2011-06-23 DIAGNOSIS — M47812 Spondylosis without myelopathy or radiculopathy, cervical region: Secondary | ICD-10-CM | POA: Insufficient documentation

## 2011-06-23 DIAGNOSIS — I10 Essential (primary) hypertension: Secondary | ICD-10-CM | POA: Insufficient documentation

## 2011-06-23 DIAGNOSIS — F0390 Unspecified dementia without behavioral disturbance: Secondary | ICD-10-CM | POA: Insufficient documentation

## 2011-06-23 NOTE — ED Notes (Signed)
Ordered dinner tray.  

## 2011-06-23 NOTE — ED Notes (Signed)
Placed call to [name]

## 2011-06-23 NOTE — ED Notes (Signed)
Kristi Snow(son) [phone]

## 2011-06-23 NOTE — ED Provider Notes (Signed)
History     CSN: [id number]  Arrival date & time 06/23/11  1636   First MD Initiated Contact with Patient 06/23/11 1646      Chief Complaint  Patient presents with  . Fall    (Consider location/radiation/quality/duration/timing/severity/associated sxs/prior treatment) HPI Patient presents emergency department after being found on the floor next to her wheelchair staff did not notice any injuries or bleeding.  She was sent here for evaluation for the fall.  Patient is demented and not able to give me a history about what happened.  She denies pain anywhere. Past Medical History  Diagnosis Date  . Hypertension   . Diabetes mellitus   . Encephalopathy, unspecified   . Urinary tract infection   . Dementia   . Prolapsed uterus   . Lumbar spinal stenosis     Past Surgical History  Procedure Date  . No past surgeries     History reviewed. No pertinent family history.  History  Substance Use Topics  . Smoking status: Never Smoker   . Smokeless tobacco: Not on file  . Alcohol Use: No    OB History    Grav Para Term Preterm Abortions TAB SAB Ect Mult Living                  Review of Systems Level V caveat applies due to dementia Allergies  Review of patient's allergies indicates no known allergies.  Home Medications   Current Outpatient Rx  Name Route Sig Dispense Refill  . AMLODIPINE BESYLATE 10 MG PO TABS Oral Take 1 tablet (10 mg total) by mouth daily.    Marland Kitchen BENZTROPINE MESYLATE 1 MG PO TABS Oral Take 1 mg by mouth 2 (two) times daily.    Marland Kitchen CILOSTAZOL 100 MG PO TABS Oral Take 100 mg by mouth 2 (two) times daily.      Marland Kitchen DIPHENHYDRAMINE HCL (SLEEP) 25 MG PO TABS Oral Take 25 mg by mouth 2 (two) times daily.    . DONEPEZIL HCL 5 MG PO TABS Oral Take 5 mg by mouth daily.     Marland Kitchen HYDRALAZINE HCL 10 MG PO TABS Oral Take 1 tablet (10 mg total) by mouth 3 (three) times daily.    . INSULIN ASPART 100 UNIT/ML [place] SOLN  Before each meal 3 times a day,140-199 - 2 units,  200-250 - 4 units, 251-299 - 6 units, 300-349 - 8 units,  350 or above 10 units. 1 vial   . INSULIN GLARGINE 100 UNIT/ML [place] SOLN Subcutaneous Inject 10 Units into the skin 2 (two) times daily. After eats Lunch and Supper, Hold if patient does not eat meals. 10 mL   . ISOSORBIDE MONONITRATE 20 MG PO TABS Oral Take 20 mg by mouth 2 (two) times daily. Space doses by at least (7) hours.     . ACETAMINOPHEN 500 MG PO TABS Oral Take 500 mg by mouth 3 (three) times daily.      BP 147/62  Pulse 69  Temp(Src) 98.2 F (36.8 C) (Oral)  Resp 16  SpO2 100%  Physical Exam Physical Examination: General appearance - alert, well appearing, and in no distress and normal appearing weight Head-normocephalic and atraumatic Eyes - pupils equal and reactive, extraocular eye movements intact Ears - bilateral TM's and external ear canals normal Nose - normal and patent, no erythema, discharge or polyps Mouth - mucous membranes moist, pharynx normal without lesions Chest - clear to auscultation, no wheezes, rales or rhonchi, symmetric air entry Heart - normal  rate, regular rhythm, normal S1, S2, no murmurs, rubs, clicks or gallops Back exam - full range of motion, no tenderness, palpable spasm or pain on motion Musculoskeletal - no joint tenderness, deformity or swelling, no muscular tenderness noted, full range of motion without pain, the hips have full range of motion without pain in the same applies to her shoulders, elbows, wrists, knees, and ankles.  There is no signs of obvious trauma on examination  ED Course  Procedures (including critical care time)  Labs Reviewed - No data to display Dg Pelvis 1-2 Views  06/23/2011  *RADIOLOGY REPORT*  Clinical Data: Fall.  Pelvic pain. Confusion  PELVIS - 1-2 VIEW  Comparison: None.  Findings: Calcified fibroids are present in the anatomic pelvis. Severe lumbar spondylosis and scoliosis.  Changes of DISH are present.  Mild bilateral hip osteoarthritis is present.  Atherosclerosis.  No displaced pelvic ring or proximal femur fracture is identified.  IMPRESSION: No acute osseous abnormality.  Original Report Authenticated By: Andreas Newport, M.D.   Ct Head Wo Contrast  06/23/2011  *RADIOLOGY REPORT*  Clinical Data:  Dimension with fall.  CT HEAD WITHOUT CONTRAST CT CERVICAL SPINE WITHOUT CONTRAST  Technique:  Multidetector CT imaging of the head and cervical spine was performed following the standard protocol without intravenous contrast.  Multiplanar CT image reconstructions of the cervical spine were also generated.  Comparison:  CT head from 06/12/2011  CT HEAD  Findings: There is no evidence for acute hemorrhage, hydrocephalus, mass lesion, or abnormal extra-axial fluid collection.  No definite CT evidence for acute infarction.  Diffuse loss of parenchymal volume is consistent with atrophy. Global ventricular prominence is stable likely reflects central atrophic change.  Patchy low attenuation in the deep hemispheric and periventricular white matter is nonspecific, but likely reflects chronic microvascular ischemic demyelination.  Chronic mucosal disease is seen in the right sphenoid sinus, scattered ethmoid air cells, and the left maxillary sinus.  No evidence for skull fracture.  IMPRESSION: No acute intracranial abnormality.  Atrophy with chronic small vessel white matter ischemic demyelination.  Chronic paranasal sinus disease.  CT CERVICAL SPINE  Findings: Imaging was obtained from the skull base through the C7- T1 interspace.  Initial imaging had substantial motion artifact at the C6-7 level and repeat imaging was performed through this region.  No evidence for fracture.  No subluxation.  Loss of disc height is seen at C3-4.  There is some mild facet osteoarthritis at mid and lower cervical levels, left greater than right.  No prevertebral soft tissue swelling.  8 mm nodule is identified in the posterior left thyroid lobe.  IMPRESSION: No acute cervical spine  fracture.  Mid and lower cervical facet degeneration.  8 mm left thyroid nodule.  Thyroid ultrasound may prove helpful to further evaluate.  Original Report Authenticated By: ERIC A. MANSELL, M.D.   Ct Cervical Spine Wo Contrast  06/23/2011  *RADIOLOGY REPORT*  Clinical Data:  Dimension with fall.  CT HEAD WITHOUT CONTRAST CT CERVICAL SPINE WITHOUT CONTRAST  Technique:  Multidetector CT imaging of the head and cervical spine was performed following the standard protocol without intravenous contrast.  Multiplanar CT image reconstructions of the cervical spine were also generated.  Comparison:  CT head from 06/12/2011  CT HEAD  Findings: There is no evidence for acute hemorrhage, hydrocephalus, mass lesion, or abnormal extra-axial fluid collection.  No definite CT evidence for acute infarction.  Diffuse loss of parenchymal volume is consistent with atrophy. Global ventricular prominence is stable likely reflects central  atrophic change.  Patchy low attenuation in the deep hemispheric and periventricular white matter is nonspecific, but likely reflects chronic microvascular ischemic demyelination.  Chronic mucosal disease is seen in the right sphenoid sinus, scattered ethmoid air cells, and the left maxillary sinus.  No evidence for skull fracture.  IMPRESSION: No acute intracranial abnormality.  Atrophy with chronic small vessel white matter ischemic demyelination.  Chronic paranasal sinus disease.  CT CERVICAL SPINE  Findings: Imaging was obtained from the skull base through the C7- T1 interspace.  Initial imaging had substantial motion artifact at the C6-7 level and repeat imaging was performed through this region.  No evidence for fracture.  No subluxation.  Loss of disc height is seen at C3-4.  There is some mild facet osteoarthritis at mid and lower cervical levels, left greater than right.  No prevertebral soft tissue swelling.  8 mm nodule is identified in the posterior left thyroid lobe.  IMPRESSION: No  acute cervical spine fracture.  Mid and lower cervical facet degeneration.  8 mm left thyroid nodule.  Thyroid ultrasound may prove helpful to further evaluate.  Original Report Authenticated By: ERIC A. MANSELL, M.D.     The patient will be sent back to the nursing facility with instructions to return here for any worsening in her condition. also advised to followup with her primary care doctor.  There is no signs of trauma on her x-rays.    MDM         Carlyle Dolly, PA-C 06/23/11 1936

## 2011-06-23 NOTE — ED Notes (Addendum)
Per EMS, pt from [facility] for falling from her wheelchair. Unsure if stood up and fell or slid out of w/c. Pt was in another pt's room when this happened.  No LOC. Only c/o pain to left hip but sts that has been bothering her before today. 150/60 and 78 HR. Pt has history of dementia

## 2011-06-23 NOTE — ED Notes (Signed)
Pt to be discharged back to facility.  Report given to [facility], Charity fundraiser at [facility].  [name] notified of need for transfer to facility.

## 2011-06-23 NOTE — ED Notes (Signed)
[name] arrived.  Pt back to facility via ambulance.  NAD noted.

## 2011-06-23 NOTE — Discharge Instructions (Signed)
Return here as needed.  Follow-up with your primary care doctor °

## 2011-06-23 NOTE — ED Provider Notes (Signed)
Medical screening examination/treatment/procedure(s) were performed by non-physician practitioner and as supervising physician I was immediately available for consultation/collaboration.   Haywood Meinders L Amoreena Neubert, MD 06/23/11 2327 

## 2011-06-29 ENCOUNTER — Telehealth: Payer: Self-pay

## 2011-06-29 NOTE — Telephone Encounter (Signed)
Called to inquire about how to send pessary back to [facility]. Unit sent back due to pt's inability to get in to have it inserted. She is in a long term care facility. Melody Comas A

## 2011-07-06 ENCOUNTER — Emergency Department (HOSPITAL_COMMUNITY): Payer: Medicare Other

## 2011-07-06 ENCOUNTER — Inpatient Hospital Stay (HOSPITAL_COMMUNITY)
Admission: EM | Admit: 2011-07-06 | Discharge: 2011-07-09 | DRG: 690 | Disposition: A | Discharge status: Skilled Nursing Facility | Payer: Medicare Other | Attending: Internal Medicine | Admitting: Internal Medicine

## 2011-07-06 ENCOUNTER — Encounter (HOSPITAL_COMMUNITY): Payer: Self-pay | Admitting: *Deleted

## 2011-07-06 DIAGNOSIS — I10 Essential (primary) hypertension: Secondary | ICD-10-CM | POA: Diagnosis present

## 2011-07-06 DIAGNOSIS — M1710 Unilateral primary osteoarthritis, unspecified knee: Secondary | ICD-10-CM | POA: Diagnosis present

## 2011-07-06 DIAGNOSIS — N390 Urinary tract infection, site not specified: Principal | ICD-10-CM | POA: Diagnosis present

## 2011-07-06 DIAGNOSIS — M48061 Spinal stenosis, lumbar region without neurogenic claudication: Secondary | ICD-10-CM

## 2011-07-06 DIAGNOSIS — M119 Crystal arthropathy, unspecified: Secondary | ICD-10-CM | POA: Diagnosis present

## 2011-07-06 DIAGNOSIS — B9620 Unspecified Escherichia coli [E. coli] as the cause of diseases classified elsewhere: Secondary | ICD-10-CM

## 2011-07-06 DIAGNOSIS — M25461 Effusion, right knee: Secondary | ICD-10-CM | POA: Diagnosis present

## 2011-07-06 DIAGNOSIS — E1165 Type 2 diabetes mellitus with hyperglycemia: Secondary | ICD-10-CM | POA: Diagnosis present

## 2011-07-06 DIAGNOSIS — R509 Fever, unspecified: Secondary | ICD-10-CM

## 2011-07-06 DIAGNOSIS — F0390 Unspecified dementia without behavioral disturbance: Secondary | ICD-10-CM | POA: Diagnosis present

## 2011-07-06 DIAGNOSIS — IMO0001 Reserved for inherently not codable concepts without codable children: Secondary | ICD-10-CM | POA: Diagnosis present

## 2011-07-06 DIAGNOSIS — M11869 Other specified crystal arthropathies, unspecified knee: Secondary | ICD-10-CM | POA: Diagnosis present

## 2011-07-06 HISTORY — DX: Crystal arthropathy, unspecified: M11.9

## 2011-07-06 LAB — CBC
MCH: 29.5 pg (ref 26.0–34.0)
MCHC: 33 g/dL (ref 30.0–36.0)
MCV: 89.5 fL (ref 78.0–100.0)
Platelets: 253 10*3/uL (ref 150–400)
RBC: 4.1 MIL/uL (ref 3.87–5.11)
RDW: 13.9 % (ref 11.5–15.5)

## 2011-07-06 LAB — URINE MICROSCOPIC-ADD ON

## 2011-07-06 LAB — DIFFERENTIAL
Basophils Relative: 0 % (ref 0–1)
Eosinophils Absolute: 0 10*3/uL (ref 0.0–0.7)
Eosinophils Relative: 0 % (ref 0–5)
Lymphs Abs: 1 10*3/uL (ref 0.7–4.0)
Neutrophils Relative %: 79 % — ABNORMAL HIGH (ref 43–77)

## 2011-07-06 LAB — URINALYSIS, ROUTINE W REFLEX MICROSCOPIC
Bilirubin Urine: NEGATIVE
Nitrite: NEGATIVE
Protein, ur: 30 mg/dL — AB
Urobilinogen, UA: 0.2 mg/dL (ref 0.0–1.0)

## 2011-07-06 LAB — BASIC METABOLIC PANEL
Calcium: 8.8 mg/dL (ref 8.4–10.5)
GFR calc Af Amer: >90 mL/min (ref >90)
GFR calc non Af Amer: 80 mL/min — ABNORMAL LOW (ref >90)
Glucose, Bld: 276 mg/dL — ABNORMAL HIGH (ref 70–99)
Potassium: 3.7 mEq/L (ref 3.5–5.1)
Sodium: 136 mEq/L (ref 135–145)

## 2011-07-06 MED ORDER — SODIUM CHLORIDE 0.9 % IV SOLN
INTRAVENOUS | Status: DC
  Administered 2011-07-06 – 2011-07-07 (×2): via INTRAVENOUS
  Administered 2011-07-07: 125 mL/h via INTRAVENOUS

## 2011-07-06 MED ORDER — FENTANYL CITRATE 0.05 MG/ML IJ SOLN
50.0000 ug | Freq: Once | INTRAMUSCULAR | Status: AC
  Administered 2011-07-06: 50 ug via INTRAVENOUS
  Filled 2011-07-06: qty 2

## 2011-07-06 NOTE — ED Notes (Signed)
[id number]<AV> Expected date:07/06/11<BR> Expected time: 7:11 PM<BR> Means of arrival:Ambulance<BR> Comments:<BR> EMS- 78yo fever

## 2011-07-06 NOTE — ED Notes (Signed)
Patient transported to X-ray 

## 2011-07-06 NOTE — ED Provider Notes (Signed)
History     CSN: [id number]  Arrival date & time 07/06/11  8413   First MD Initiated Contact with Patient 07/06/11 2157      Chief Complaint  Patient presents with  . Fever   Level V caveat due to dementia (Consider location/radiation/quality/duration/timing/severity/associated sxs/prior treatment) The history is provided by the patient and a relative.   patient was sent from the nursing home for a temperature of 101. She also has a painful right knee. She has baseline dementia. She states she's had an occasional cough. No nausea vomiting or diarrhea. No dysuria. She states she's felt bad for last few days. She's able to identify her family member at her bedside. She states she has pain in her knees. She states is worse on the right side. No trauma. She does have a history of arthritis  Past Medical History  Diagnosis Date  . Hypertension   . Diabetes mellitus   . Encephalopathy, unspecified   . Urinary tract infection   . Dementia   . Prolapsed uterus   . Lumbar spinal stenosis     Past Surgical History  Procedure Date  . No past surgeries     History reviewed. No pertinent family history.  History  Substance Use Topics  . Smoking status: Never Smoker   . Smokeless tobacco: Never Used  . Alcohol Use: No    OB History    Grav Para Term Preterm Abortions TAB SAB Ect Mult Living                  Review of Systems  Unable to perform ROS: Dementia  Constitutional: Positive for fever.    Allergies  Review of patient's allergies indicates no known allergies.  Home Medications   No current outpatient prescriptions on file.  BP 105/63  Pulse 83  Temp(Src) 98.5 F (36.9 C) (Axillary)  Resp 16  Ht 5\' 6"  (1.676 m)  Wt 153 lb (69.4 kg)  BMI 24.69 kg/m2  SpO2 99%  Physical Exam  Constitutional: She appears well-developed.  HENT:  Head: Normocephalic and atraumatic.  Eyes: Pupils are equal, round, and reactive to light.  Cardiovascular: Regular rhythm.     No murmur heard.      Tachycardia  Pulmonary/Chest: Effort normal and breath sounds normal.  Abdominal: Soft. She exhibits no distension. There is no tenderness.  Musculoskeletal: She exhibits tenderness.       Tenderness and pain with movement of bilateral knees. Worse on the right. Some swelling to the right knee. Neurovascularly intact distally. Irritable right knee.  Neurological: She is alert.       Oriented at baseline per family member  Skin: Skin is warm.    ED Course  Procedures (including critical care time)  Labs Reviewed  DIFFERENTIAL - Abnormal; Notable for the following:    Neutrophils Relative 79 (*)    All other components within normal limits  BASIC METABOLIC PANEL - Abnormal; Notable for the following:    Glucose, Bld 276 (*)    GFR calc non Af Amer 80 (*)    All other components within normal limits  URINALYSIS, ROUTINE W REFLEX MICROSCOPIC - Abnormal; Notable for the following:    APPearance TURBID (*)    Glucose, UA >1000 (*)    Hgb urine dipstick SMALL (*)    Ketones, ur 15 (*)    Protein, ur 30 (*)    Leukocytes, UA LARGE (*)    All other components within normal limits  SEDIMENTATION RATE -  Abnormal; Notable for the following:    Sed Rate 58 (*)    All other components within normal limits  URINE MICROSCOPIC-ADD ON - Abnormal; Notable for the following:    Bacteria, UA MANY (*)    All other components within normal limits  CELL COUNT + DIFF,  W/ CRYST-SYNVL FLD - Abnormal; Notable for the following:    Color, Synovial YELLOW (*)    Appearance-Synovial CLOUDY (*)    WBC, Synovial 16109 (*)    Neutrophil, Synovial 92 (*)    Monocyte-Macrophage-Synovial Fluid 8 (*)    All other components within normal limits  COMPREHENSIVE METABOLIC PANEL - Abnormal; Notable for the following:    Glucose, Bld 332 (*)    Albumin 3.1 (*)    GFR calc non Af Amer 84 (*)    All other components within normal limits  GLUCOSE, CAPILLARY - Abnormal; Notable for the  following:    Glucose-Capillary 307 (*)    All other components within normal limits  GLUCOSE, CAPILLARY - Abnormal; Notable for the following:    Glucose-Capillary 408 (*)    All other components within normal limits  GLUCOSE, CAPILLARY - Abnormal; Notable for the following:    Glucose-Capillary 245 (*)    All other components within normal limits  GLUCOSE, CAPILLARY - Abnormal; Notable for the following:    Glucose-Capillary 218 (*)    All other components within normal limits  GLUCOSE, CAPILLARY - Abnormal; Notable for the following:    Glucose-Capillary 52 (*)    All other components within normal limits  CBC  CULTURE, BLOOD (ROUTINE X 2)  CULTURE, BLOOD (ROUTINE X 2)  BODY FLUID CULTURE  CBC  URINE CULTURE   Dg Chest 2 View  07/06/2011  *RADIOLOGY REPORT*  Clinical Data: Fever.  Generalized weakness.  CHEST - 2 VIEW  Comparison: Portable chest x-ray 06/12/2011, 05/22/2011.  Findings: Markedly suboptimal inspiration accounts for atelectasis in the lower lobes, left greater than right.  This also accentuates the cardiac silhouette which is upper normal in size to perhaps slightly enlarged.  Hilar and mediastinal contours otherwise unremarkable.  Lungs otherwise clear.  No pleural effusions. Degenerative changes involving the thoracic spine and both shoulders.  IMPRESSION: Markedly suboptimal inspiration accounts for atelectasis in the lower lobes, left greater than right.  No acute cardiopulmonary disease otherwise.  Borderline to mild cardiomegaly without pulmonary edema.  Original Report Authenticated By: Arnell Sieving, M.D.   Dg Knee Complete 4 Views Right  07/06/2011  *RADIOLOGY REPORT*  Clinical Data: 92-month history of right knee pain.  RIGHT KNEE - COMPLETE 4+ VIEW  Comparison: None.  Findings: Severe tricompartment joint space narrowing and associated hypertrophic spurring.  Calcified loose bodies within the joint.  Small to moderate sized joint effusion.  No evidence of  acute or subacute fracture or dislocation.  Femoropopliteal and tibioperoneal artery atherosclerosis.  IMPRESSION: Severe osteoarthritis, possibly secondary to CPPD.  Calcified intra- articular loose bodies.  Small to moderate sized joint effusion.  Original Report Authenticated By: Arnell Sieving, M.D.     1. Fever   2. UTI (lower urinary tract infection)       MDM  Patient with fever and knee pain. Lab work and urine is pending at time of discharge. Care was turned over.         Juliet Rude. Rubin Payor, MD 07/08/11 1504

## 2011-07-06 NOTE — ED Notes (Signed)
Per EMS:  Pt is from [facility], EMS was called due to 101 temp and because pt's R knee is swollen (pt has hx of arthritis).  Pt in nad, pt is alert and oriented at baseline (pt has dementia).  Pt is no diaphoretic, just warm to the touch.

## 2011-07-06 NOTE — ED Notes (Signed)
Spoke with staff member from [facility] care.  Rubie Maid:  [phone]

## 2011-07-07 ENCOUNTER — Encounter (HOSPITAL_COMMUNITY): Payer: Self-pay | Admitting: Internal Medicine

## 2011-07-07 DIAGNOSIS — N390 Urinary tract infection, site not specified: Secondary | ICD-10-CM | POA: Diagnosis present

## 2011-07-07 DIAGNOSIS — F0280 Dementia in other diseases classified elsewhere without behavioral disturbance: Secondary | ICD-10-CM

## 2011-07-07 DIAGNOSIS — R509 Fever, unspecified: Secondary | ICD-10-CM

## 2011-07-07 DIAGNOSIS — G309 Alzheimer's disease, unspecified: Secondary | ICD-10-CM

## 2011-07-07 DIAGNOSIS — M13169 Monoarthritis, not elsewhere classified, unspecified knee: Secondary | ICD-10-CM

## 2011-07-07 DIAGNOSIS — M25461 Effusion, right knee: Secondary | ICD-10-CM | POA: Diagnosis present

## 2011-07-07 DIAGNOSIS — E782 Mixed hyperlipidemia: Secondary | ICD-10-CM

## 2011-07-07 LAB — COMPREHENSIVE METABOLIC PANEL
ALT: 8 U/L (ref 0–35)
AST: 16 U/L (ref 0–37)
Albumin: 3.1 g/dL — ABNORMAL LOW (ref 3.5–5.2)
Alkaline Phosphatase: 113 U/L (ref 39–117)
Glucose, Bld: 332 mg/dL — ABNORMAL HIGH (ref 70–99)
Potassium: 4 mEq/L (ref 3.5–5.1)
Sodium: 138 mEq/L (ref 135–145)
Total Protein: 7.4 g/dL (ref 6.0–8.3)

## 2011-07-07 LAB — GLUCOSE, CAPILLARY: Glucose-Capillary: 245 mg/dL — ABNORMAL HIGH (ref 70–99)

## 2011-07-07 LAB — CBC
HCT: 37 % (ref 36.0–46.0)
Hemoglobin: 12.2 g/dL (ref 12.0–15.0)
MCHC: 33 g/dL (ref 30.0–36.0)
MCV: 89.2 fL (ref 78.0–100.0)
RDW: 13.7 % (ref 11.5–15.5)

## 2011-07-07 LAB — SYNOVIAL CELL COUNT + DIFF, W/ CRYSTALS
Crystals, Fluid: NONE SEEN
Eosinophils-Synovial: 0 % (ref 0–1)
WBC, Synovial: 16967 /mm3 — ABNORMAL HIGH (ref 0–200)

## 2011-07-07 MED ORDER — CILOSTAZOL 100 MG PO TABS
100.0000 mg | ORAL_TABLET | Freq: Two times a day (BID) | ORAL | Status: DC
Start: 2011-07-07 — End: 2011-07-09
  Administered 2011-07-07 – 2011-07-09 (×5): 100 mg via ORAL
  Filled 2011-07-07 (×6): qty 1

## 2011-07-07 MED ORDER — AMLODIPINE BESYLATE 10 MG PO TABS
10.0000 mg | ORAL_TABLET | Freq: Every day | ORAL | Status: DC
Start: 2011-07-07 — End: 2011-07-09
  Administered 2011-07-07 – 2011-07-09 (×3): 10 mg via ORAL
  Filled 2011-07-07 (×3): qty 1

## 2011-07-07 MED ORDER — HYDRALAZINE HCL 10 MG PO TABS
10.0000 mg | ORAL_TABLET | Freq: Three times a day (TID) | ORAL | Status: DC
  Administered 2011-07-07 – 2011-07-09 (×6): 10 mg via ORAL
  Filled 2011-07-07 (×8): qty 1

## 2011-07-07 MED ORDER — QUETIAPINE 12.5 MG HALF TABLET
12.5000 mg | ORAL_TABLET | Freq: Two times a day (BID) | ORAL | Status: DC
  Administered 2011-07-07 – 2011-07-09 (×4): 12.5 mg via ORAL
  Filled 2011-07-07 (×6): qty 1

## 2011-07-07 MED ORDER — INSULIN ASPART 100 UNIT/ML ~~LOC~~ SOLN
0.0000 [IU] | Freq: Three times a day (TID) | SUBCUTANEOUS | Status: DC
  Administered 2011-07-07: 20 [IU] via SUBCUTANEOUS
  Administered 2011-07-08: 7 [IU] via SUBCUTANEOUS

## 2011-07-07 MED ORDER — INSULIN ASPART 100 UNIT/ML ~~LOC~~ SOLN
0.0000 [IU] | Freq: Every day | SUBCUTANEOUS | Status: DC
  Administered 2011-07-07: 2 [IU] via SUBCUTANEOUS

## 2011-07-07 MED ORDER — SODIUM CHLORIDE 0.9 % IV SOLN
INTRAVENOUS | Status: DC
  Administered 2011-07-07 – 2011-07-09 (×4): via INTRAVENOUS

## 2011-07-07 MED ORDER — ACETAMINOPHEN 650 MG RE SUPP: 650.0000 mg | Freq: Four times a day (QID) | RECTAL | Status: DC | PRN

## 2011-07-07 MED ORDER — LIDOCAINE HCL 1 % IJ SOLN
5.0000 mL | Freq: Once | INTRAMUSCULAR | Status: AC
  Administered 2011-07-07: 5 mL
  Filled 2011-07-07: qty 20

## 2011-07-07 MED ORDER — ACETAMINOPHEN 325 MG PO TABS
650.0000 mg | ORAL_TABLET | Freq: Four times a day (QID) | ORAL | Status: DC | PRN
  Administered 2011-07-07: 650 mg via ORAL
  Filled 2011-07-07: qty 2

## 2011-07-07 MED ORDER — DEXTROSE 5 % IV SOLN
1.0000 g | Freq: Once | INTRAVENOUS | Status: AC
  Administered 2011-07-07: 1 g via INTRAVENOUS
  Filled 2011-07-07: qty 10

## 2011-07-07 MED ORDER — ENOXAPARIN SODIUM 40 MG/0.4ML ~~LOC~~ SOLN
40.0000 mg | SUBCUTANEOUS | Status: DC
  Administered 2011-07-07 – 2011-07-08 (×2): 40 mg via SUBCUTANEOUS
  Filled 2011-07-07 (×3): qty 0.4

## 2011-07-07 MED ORDER — DEXTROSE 5 % IV SOLN
1.0000 g | INTRAVENOUS | Status: DC
  Administered 2011-07-08 – 2011-07-09 (×2): 1 g via INTRAVENOUS
  Filled 2011-07-07 (×2): qty 10

## 2011-07-07 MED ORDER — ISOSORBIDE MONONITRATE 20 MG PO TABS
20.0000 mg | ORAL_TABLET | Freq: Two times a day (BID) | ORAL | Status: DC
  Administered 2011-07-07 – 2011-07-09 (×5): 20 mg via ORAL
  Filled 2011-07-07 (×6): qty 1

## 2011-07-07 MED ORDER — DONEPEZIL HCL 5 MG PO TABS
5.0000 mg | ORAL_TABLET | Freq: Every day | ORAL | Status: DC
  Administered 2011-07-07 – 2011-07-08 (×2): 5 mg via ORAL
  Filled 2011-07-07 (×3): qty 1

## 2011-07-07 MED ORDER — ONDANSETRON HCL 4 MG PO TABS: 4.0000 mg | ORAL_TABLET | Freq: Four times a day (QID) | ORAL | Status: DC | PRN

## 2011-07-07 MED ORDER — DIPHENHYDRAMINE HCL (SLEEP) 25 MG PO TABS: 25.0000 mg | ORAL_TABLET | Freq: Two times a day (BID) | ORAL | Status: DC

## 2011-07-07 MED ORDER — BENZTROPINE MESYLATE 1 MG PO TABS
1.0000 mg | ORAL_TABLET | Freq: Two times a day (BID) | ORAL | Status: DC
  Administered 2011-07-07 – 2011-07-09 (×5): 1 mg via ORAL
  Filled 2011-07-07 (×6): qty 1

## 2011-07-07 MED ORDER — INSULIN GLARGINE 100 UNIT/ML ~~LOC~~ SOLN
10.0000 [IU] | Freq: Two times a day (BID) | SUBCUTANEOUS | Status: DC
  Administered 2011-07-07 (×2): 10 [IU] via SUBCUTANEOUS
  Filled 2011-07-07: qty 1

## 2011-07-07 MED ORDER — DIPHENHYDRAMINE HCL 25 MG PO CAPS
25.0000 mg | ORAL_CAPSULE | Freq: Two times a day (BID) | ORAL | Status: DC
  Administered 2011-07-07 – 2011-07-09 (×5): 25 mg via ORAL
  Filled 2011-07-07 (×7): qty 1

## 2011-07-07 MED ORDER — ONDANSETRON HCL 4 MG/2ML IJ SOLN
4.0000 mg | Freq: Four times a day (QID) | INTRAMUSCULAR | Status: DC | PRN
  Administered 2011-07-07: 4 mg via INTRAVENOUS

## 2011-07-07 NOTE — ED Notes (Signed)
Pt refusing IV restart. Admitting MD notified.

## 2011-07-07 NOTE — ED Notes (Signed)
Patient care assumed from Dr. Rubin Payor. Has fever and UTI and inflamed and knee. Right knee arthrocentesis performed  - see note.  Synovial fluid sent to the lab for analysis. IV Rocephin initiated for UTI. It is in consult obtained and case discussed with Dr. Toniann Fail who agrees to admission.  Sunnie Nielsen, MD 07/07/11 [phone]

## 2011-07-07 NOTE — ED Notes (Signed)
MD at bedside. 

## 2011-07-07 NOTE — ED Notes (Signed)
Safety Sitter at bedside 

## 2011-07-07 NOTE — Plan of Care (Signed)
Problem: Consults Goal: Diabetes Guidelines if Diabetic/Glucose > 140 If diabetic or lab glucose is > 140 mg/dl - Initiate Diabetes/Hyperglycemia Guidelines & Document Interventions  Outcome: Completed/Met Date Met:  07/07/11 Ac/hs ssi with resistant coverage. Carb modified 1400-1600 cal diet.

## 2011-07-07 NOTE — ED Notes (Signed)
Attempt x1 to call report to 3W. Requests to give RN 5 minutes and call back.

## 2011-07-07 NOTE — H&P (Signed)
Kristi Snow is an 78 y.o. female.   PCP - Andi Devon. Chief Complaint: Fever. HPI: 78 year-old female with history of advanced dementia, diabetes mellitus2, hypertension was brought to the ER from nursing home because of fever. In the ER patient also was noticed to have right knee swelling. I did discuss with patient's son and he is not to aware how long the knee was swollen. Labs showed UA with UTI features. An aspiration was done of the right knee. Labs are pending at this time but ER physician said that the fluid looked clear. At this time patient has been admitted for fever most likely from UTI. Otherwise patient did not have any nausea vomiting abdominal pain chest pain or diarrhea.  Past Medical History  Diagnosis Date  . Hypertension   . Diabetes mellitus   . Encephalopathy, unspecified   . Urinary tract infection   . Dementia   . Prolapsed uterus   . Lumbar spinal stenosis     Past Surgical History  Procedure Date  . No past surgeries     History reviewed. No pertinent family history. Social History:  reports that she has never smoked. She does not have any smokeless tobacco history on file. She reports that she does not drink alcohol or use illicit drugs.  Allergies: No Known Allergies   (Not in a hospital admission)  Results for orders placed during the hospital encounter of 07/06/11 (from the past 48 hour(s))  CBC     Status: Normal   Collection Time   07/06/11 10:39 PM      Component Value Range Comment   WBC 7.8  4.0 - 10.5 (K/uL)    RBC 4.10  3.87 - 5.11 (MIL/uL)    Hemoglobin 12.1  12.0 - 15.0 (g/dL)    HCT 16.1  09.6 - 04.5 (%)    MCV 89.5  78.0 - 100.0 (fL)    MCH 29.5  26.0 - 34.0 (pg)    MCHC 33.0  30.0 - 36.0 (g/dL)    RDW 40.9  81.1 - 91.4 (%)    Platelets 253  150 - 400 (K/uL)   DIFFERENTIAL     Status: Abnormal   Collection Time   07/06/11 10:39 PM      Component Value Range Comment   Neutrophils Relative 79 (*) 43 - 77 (%)    Neutro Abs 6.2   1.7 - 7.7 (K/uL)    Lymphocytes Relative 12  12 - 46 (%)    Lymphs Abs 1.0  0.7 - 4.0 (K/uL)    Monocytes Relative 8  3 - 12 (%)    Monocytes Absolute 0.6  0.1 - 1.0 (K/uL)    Eosinophils Relative 0  0 - 5 (%)    Eosinophils Absolute 0.0  0.0 - 0.7 (K/uL)    Basophils Relative 0  0 - 1 (%)    Basophils Absolute 0.0  0.0 - 0.1 (K/uL)   BASIC METABOLIC PANEL     Status: Abnormal   Collection Time   07/06/11 10:39 PM      Component Value Range Comment   Sodium 136  135 - 145 (mEq/L)    Potassium 3.7  3.5 - 5.1 (mEq/L)    Chloride 100  96 - 112 (mEq/L)    CO2 25  19 - 32 (mEq/L)    Glucose, Bld 276 (*) 70 - 99 (mg/dL)    BUN 12  6 - 23 (mg/dL)    Creatinine, Ser 7.82  0.50 -  1.10 (mg/dL)    Calcium 8.8  8.4 - 10.5 (mg/dL)    GFR calc non Af Amer 80 (*) >90 (mL/min)    GFR calc Af Amer >90  >90 (mL/min)   SEDIMENTATION RATE     Status: Abnormal   Collection Time   07/06/11 10:39 PM      Component Value Range Comment   Sed Rate 58 (*) 0 - 22 (mm/hr)   URINALYSIS, ROUTINE W REFLEX MICROSCOPIC     Status: Abnormal   Collection Time   07/06/11 11:04 PM      Component Value Range Comment   Color, Urine YELLOW  YELLOW     APPearance TURBID (*) CLEAR     Specific Gravity, Urine 1.026  1.005 - 1.030     pH 6.0  5.0 - 8.0     Glucose, UA >1000 (*) NEGATIVE (mg/dL)    Hgb urine dipstick SMALL (*) NEGATIVE     Bilirubin Urine NEGATIVE  NEGATIVE     Ketones, ur 15 (*) NEGATIVE (mg/dL)    Protein, ur 30 (*) NEGATIVE (mg/dL)    Urobilinogen, UA 0.2  0.0 - 1.0 (mg/dL)    Nitrite NEGATIVE  NEGATIVE     Leukocytes, UA LARGE (*) NEGATIVE    URINE MICROSCOPIC-ADD ON     Status: Abnormal   Collection Time   07/06/11 11:04 PM      Component Value Range Comment   Squamous Epithelial / LPF RARE  RARE     WBC, UA TOO NUMEROUS TO COUNT  <3 (WBC/hpf) WBC CLUMPS   RBC / HPF 0-2  <3 (RBC/hpf)    Bacteria, UA MANY (*) RARE     Urine-Other MANY YEAST     CELL COUNT + DIFF,  W/ CRYST-SYNVL FLD      Status: Abnormal   Collection Time   07/07/11  5:35 AM      Component Value Range Comment   Color, Synovial YELLOW (*) YELLOW     Appearance-Synovial CLOUDY (*) CLEAR     Crystals, Fluid NO CRYSTALS SEEN      WBC, Synovial 16967 (*) 0 - 200 (/cu mm)    Neutrophil, Synovial 92 (*) 0 - 25 (%)    Lymphocytes-Synovial Fld 0  0 - 20 (%)    Monocyte-Macrophage-Synovial Fluid 8 (*) 50 - 90 (%)    Eosinophils-Synovial 0  0 - 1 (%)    Dg Chest 2 View  07/06/2011  *RADIOLOGY REPORT*  Clinical Data: Fever.  Generalized weakness.  CHEST - 2 VIEW  Comparison: Portable chest x-ray 06/12/2011, 05/22/2011.  Findings: Markedly suboptimal inspiration accounts for atelectasis in the lower lobes, left greater than right.  This also accentuates the cardiac silhouette which is upper normal in size to perhaps slightly enlarged.  Hilar and mediastinal contours otherwise unremarkable.  Lungs otherwise clear.  No pleural effusions. Degenerative changes involving the thoracic spine and both shoulders.  IMPRESSION: Markedly suboptimal inspiration accounts for atelectasis in the lower lobes, left greater than right.  No acute cardiopulmonary disease otherwise.  Borderline to mild cardiomegaly without pulmonary edema.  Original Report Authenticated By: Arnell Sieving, M.D.   Dg Knee Complete 4 Views Right  07/06/2011  *RADIOLOGY REPORT*  Clinical Data: 11-month history of right knee pain.  RIGHT KNEE - COMPLETE 4+ VIEW  Comparison: None.  Findings: Severe tricompartment joint space narrowing and associated hypertrophic spurring.  Calcified loose bodies within the joint.  Small to moderate sized joint effusion.  No evidence of  acute or subacute fracture or dislocation.  Femoropopliteal and tibioperoneal artery atherosclerosis.  IMPRESSION: Severe osteoarthritis, possibly secondary to CPPD.  Calcified intra- articular loose bodies.  Small to moderate sized joint effusion.  Original Report Authenticated By: Arnell Sieving,  M.D.    Review of Systems  Constitutional: Positive for fever.  HENT: Negative.   Eyes: Negative.   Respiratory: Negative.   Cardiovascular: Negative.   Gastrointestinal: Negative.   Genitourinary: Negative.   Musculoskeletal: Negative.        Right knee swelling.  Skin: Negative.   Neurological: Negative.   Endo/Heme/Allergies: Negative.   Psychiatric/Behavioral: Negative.     Blood pressure 139/74, pulse 91, temperature 98.7 F (37.1 C), temperature source Oral, resp. rate 20, SpO2 99.00%. Physical Exam  Constitutional: She appears well-developed and well-nourished.  HENT:  Head: Normocephalic and atraumatic.  Right Ear: External ear normal.  Left Ear: External ear normal.  Nose: Nose normal.  Mouth/Throat: Oropharynx is clear and moist. No oropharyngeal exudate.  Eyes: Conjunctivae are normal. Pupils are equal, round, and reactive to light. Right eye exhibits no discharge. Left eye exhibits no discharge. No scleral icterus.  Neck: Normal range of motion. Neck supple.  Cardiovascular: Normal rate and regular rhythm.   Respiratory: Effort normal and breath sounds normal. No respiratory distress. She has no wheezes. She has no rales.  GI: Soft. Bowel sounds are normal. She exhibits no distension. There is no tenderness. There is no rebound.  Musculoskeletal:       Right knee swelling and erythema.  Neurological: She is alert.       Oriented to her name only.Move all extremities. But right lower extremity is limited by pain.     Assessment/Plan #1. Fever most likely from UTI - will continue ceftriaxone. Follow urine cultures. #2. Right knee swelling - looks erythematous. Follow aspiration fluid cultures and Gram stain. #3. Dementia - presently stable. #4. Diabetes mellitus2 - continue present medication. Closely follow CBG. Was recently admitted for hypoglycemia. #5. Hypertension - continue home medications.  CODE STATUS  -  full code.    Eduard Clos. 07/07/2011, 7:32 AM

## 2011-07-07 NOTE — ED Notes (Signed)
Pt refused IV insert.

## 2011-07-07 NOTE — Progress Notes (Signed)
PROGRESS NOTE  [place] E[place] ZOX:[id number] DOB: August 09, 1924 DOA: 07/06/2011 PCP: Alva Garnet., MD, MD  Brief narrative: Kristi Snow is an 78 year old female with a PMH of advanced dementia, DM-2, HTN who was sent to the hospital from her SNF on 07/07/11 for evaluation of fever. Chest x-ray was clear but urinalysis shows signs of urinary tract infection, for which she is currently being treated.  Assessment/Plan: Principal Problem:  *Fever, likely from UTI  Patient was admitted and diagnostic and clinical information points to a urinary tract infection as being the source of her fever.  Empirically placed on Rocephin.  Fever curve down since admission. Active Problems:  Hypertension  Continue Norvasc,Hydralazine and isosorbide.  Diabetes mellitus, uncontrolled  Continue Lantus 10 units twice a day.  Add SSI, insulin resistant scale, q. a.c. and at bedtime.  CBGs 300-400s.  Last hemoglobin A1c was done on 06/13/2011 was 8.6%.  Knee swelling, right  Knee films show severe osteoarthritis and CPPD disease.  Dementia  Continue Seroquel, Cogentin, and Aricept.  Code Status: Full Family Communication: None at bedside. Disposition Plan: SNF when stable.  Medical Consultants:  None  Other consultants:  Physical Therapy  Antibiotics:  Rocephin 07/07/11--->   Subjective  Kristi Snow is pleasantly confused.  She reports some intermittent chest and abdominal pain. Her appetite is good and she is eating her supper.   Objective    Interim History: Stable overnight.   Objective: Filed Vitals:   07/07/11 1004 07/07/11 1309 07/07/11 1420 07/07/11 1639  BP: 155/57 153/53 141/59 140/62  Pulse: 96 98 100 101  Temp: 97.9 F (36.6 C)   98.5 F (36.9 C)  TempSrc: Oral   Oral  Resp: 18 18 18 20   Height:    5\' 6"  (1.676 m)  Weight:    69.4 kg (153 lb)  SpO2: 100% 100% 98% 98%    Intake/Output Summary (Last 24 hours) at 07/07/11 1643 Last data filed at 07/06/11 2309  Gross per 24 hour  Intake      0 ml  Output    250 ml  Net   -250 ml    Exam: Gen:  NAD Cardiovascular:  RRR, No M/R/G Respiratory: Lungs CTAB Gastrointestinal: Abdomen soft, NT/ND with normal active bowel sounds. Extremities: No C/E/C  Data Reviewed: Basic Metabolic Panel:  Lab 07/07/11 8119 07/06/11 2239  NA 138 136  K 4.0 3.7  CL 101 100  CO2 25 25  GLUCOSE 332* 276*  BUN 12 12  CREATININE 0.54 0.63  CALCIUM 8.9 8.8  MG -- --  PHOS -- --   GFR Estimated Creatinine Clearance: 49 ml/min (by C-G formula based on Cr of 0.54). Liver Function Tests:  Lab 07/07/11 1225  AST 16  ALT 8  ALKPHOS 113  BILITOT 0.7  PROT 7.4  ALBUMIN 3.1*   CBC:  Lab 07/07/11 1225 07/06/11 2239  WBC 8.0 7.8  NEUTROABS -- 6.2  HGB 12.2 12.1  HCT 37.0 36.7  MCV 89.2 89.5  PLT 287 253   CBG:  Lab 07/07/11 1303  GLUCAP 307*   Microbiology No results found for this or any previous visit (from the past 240 hour(s)).  Procedures and Diagnostic Studies:  Dg Chest 2 View 07/06/2011 IMPRESSION: Markedly suboptimal inspiration accounts for atelectasis in the lower lobes, left greater than right.  No acute cardiopulmonary disease otherwise.  Borderline to mild cardiomegaly without pulmonary edema.  Original Report Authenticated By: Arnell Sieving, M.D.    Dg Knee Complete 4 Views  Right 07/06/2011 IMPRESSION: Severe osteoarthritis, possibly secondary to CPPD.  Calcified intra- articular loose bodies.  Small to moderate sized joint effusion.  Original Report Authenticated By: Arnell Sieving, M.D.    Scheduled Meds:    . amLODipine  10 mg Oral Daily  . benztropine  1 mg Oral BID  . cefTRIAXone (ROCEPHIN)  IV  1 g Intravenous Once  . cefTRIAXone (ROCEPHIN)  IV  1 g Intravenous Q24H  . cilostazol  100 mg Oral BID  . diphenhydrAMINE  25 mg Oral BID  . donepezil  5 mg Oral QHS  . enoxaparin  40 mg Subcutaneous Q24H  . fentaNYL  50 mcg Intravenous Once  . hydrALAZINE  10 mg Oral  TID  . insulin glargine  10 Units Subcutaneous BID  . isosorbide mononitrate  20 mg Oral BID  . lidocaine  5 mL Other Once  . QUEtiapine  12.5 mg Oral BID  . DISCONTD: diphenhydrAMINE  25 mg Oral BID   Continuous Infusions:    . sodium chloride 75 mL/hr at 07/07/11 1422  . DISCONTD: sodium chloride Stopped (07/07/11 1424)      LOS: 1 day   Hillery Aldo, MD Pager [phone]  07/07/2011, 4:43 PM

## 2011-07-07 NOTE — ED Provider Notes (Signed)
Medical screening examination/treatment/procedure(s) were performed by non-physician practitioner and as supervising physician I was immediately available for consultation/collaboration.  Sunnie Nielsen, MD 07/07/11 [phone]

## 2011-07-07 NOTE — ED Notes (Signed)
Kristi Snow(son) [phone] 

## 2011-07-07 NOTE — ED Provider Notes (Signed)
After consent was obtained, using sterile technique the right knee was prepped and plain Lidocaine 1% was used as local anesthetic. The joint was entered and 20 ml's of tan colored fluid was withdrawn and sent for culture, gram stain, cell count and crystals. The procedure was well tolerated.  The patient is asked to continue to rest the joint for a few more days before resuming regular activities.  It may be more painful for the first 1-2 days.  Watch for fever, or increased swelling or persistent pain in the joint. Call or return to clinic prn if such symptoms occur or there is failure to improve as anticipated.   Jaci Carrel, [place] 07/07/11 [phone]

## 2011-07-08 DIAGNOSIS — E782 Mixed hyperlipidemia: Secondary | ICD-10-CM

## 2011-07-08 DIAGNOSIS — F0280 Dementia in other diseases classified elsewhere without behavioral disturbance: Secondary | ICD-10-CM

## 2011-07-08 DIAGNOSIS — M13169 Monoarthritis, not elsewhere classified, unspecified knee: Secondary | ICD-10-CM

## 2011-07-08 DIAGNOSIS — G309 Alzheimer's disease, unspecified: Secondary | ICD-10-CM

## 2011-07-08 DIAGNOSIS — R509 Fever, unspecified: Secondary | ICD-10-CM

## 2011-07-08 LAB — GLUCOSE, CAPILLARY: Glucose-Capillary: 218 mg/dL — ABNORMAL HIGH (ref 70–99)

## 2011-07-08 LAB — URINE CULTURE
Colony Count: >=100000
Culture  Setup Time: 201305230946

## 2011-07-08 MED ORDER — INSULIN ASPART 100 UNIT/ML ~~LOC~~ SOLN
6.0000 [IU] | Freq: Three times a day (TID) | SUBCUTANEOUS | Status: DC
  Administered 2011-07-08: 6 [IU] via SUBCUTANEOUS

## 2011-07-08 MED ORDER — INSULIN GLARGINE 100 UNIT/ML ~~LOC~~ SOLN
15.0000 [IU] | Freq: Two times a day (BID) | SUBCUTANEOUS | Status: DC
  Administered 2011-07-08 – 2011-07-09 (×3): 15 [IU] via SUBCUTANEOUS

## 2011-07-08 MED ORDER — INSULIN ASPART 100 UNIT/ML ~~LOC~~ SOLN
0.0000 [IU] | Freq: Three times a day (TID) | SUBCUTANEOUS | Status: DC
  Administered 2011-07-08: 4 [IU] via SUBCUTANEOUS

## 2011-07-08 MED ORDER — INSULIN ASPART 100 UNIT/ML ~~LOC~~ SOLN: 0.0000 [IU] | Freq: Every day | SUBCUTANEOUS | Status: DC

## 2011-07-08 MED ORDER — IBUPROFEN 800 MG PO TABS
400.0000 mg | ORAL_TABLET | Freq: Three times a day (TID) | ORAL | Status: DC | PRN
  Administered 2011-07-08 (×2): 400 mg via ORAL
  Filled 2011-07-08 (×2): qty 1

## 2011-07-08 NOTE — Progress Notes (Addendum)
CSW consulted with the pt about rehab options. The pt informed to contact her son. She is okay with rehab options. CSW will consult with the family and fax out. CSW left a message on Casimiro Needle ( the son's) phone; [phone].  Kayleen Memos. Leighton Ruff [phone]

## 2011-07-08 NOTE — Evaluation (Signed)
Physical Therapy Evaluation Patient Details Name: Kristi Snow MRN: [id number] DOB: June 10, 1924 Today's Date: 07/08/2011 Time: 4098-1191 PT Time Calculation (min): 28 min  PT Assessment / Plan / Recommendation Clinical Impression  Pt unable to progress with standing or transfers due to pain in both knees.  She will benefit from continued PT at D/C to increase ROM and activity tolerance and progress toward functinal mobility to decrease burden of care    PT Assessment  Patient needs continued PT services    Follow Up Recommendations  Skilled nursing facility    Barriers to Discharge        lEquipment Recommendations  Defer to next venue    Recommendations for Other Services OT consult   Frequency Min 3X/week    Precautions / Restrictions Precautions Precautions: Fall Precaution Comments: history of dementia, lives in memory care unit Restrictions Weight Bearing Restrictions: No Other Position/Activity Restrictions: pt with arthritis in both knees bandaids over aspiration sites of right knee   Pertinent Vitals/Pain Pt with c/o pain in knees.. Very protective  And limits ROM of them      Mobility  Bed Mobility Bed Mobility: Supine to Sit;Sitting - Scoot to Edge of Bed Supine to Sit: 3: Mod assist Sitting - Scoot to Edge of Bed: 3: Mod assist Details for Bed Mobility Assistance: pt will come up to long sitting and needs assist to move legs to EOB and to scoot with pad to EOB Transfers Transfers: Sit to Stand;Stand to Sit Sit to Stand: 1: +2 Total assist;With upper extremity assist;From elevated surface Sit to Stand: Patient Percentage: 30% Stand to Sit: 1: +2 Total assist Stand to Sit: Patient Percentage: 30% Stand Pivot Transfers: Other (comment);1: +2 Total assist Stand Pivot Transfers: Patient Percentage: 10% Details for Transfer Assistance: ot was unable to stand erect despite several attempts with walker and with STEDY.  She was transferred to chair with lift pad in  place Wheelchair Mobility Wheelchair Mobility: No    Exercises Other Exercises Other Exercises: pt actively did LAQ activity to loosen up knees in sitting on EOB   PT Diagnosis: Difficulty walking;Abnormality of gait;Generalized weakness;Altered mental status;Acute pain  PT Problem List: Decreased strength;Decreased activity tolerance;Decreased mobility;Decreased balance;Decreased knowledge of use of DME;Decreased cognition;Decreased safety awareness;Decreased range of motion PT Treatment Interventions: DME instruction;Gait training;Functional mobility training;Therapeutic activities;Therapeutic exercise;Balance training;Cognitive remediation;Patient/family education   PT Goals Acute Rehab PT Goals PT Goal Formulation: Patient unable to participate in goal setting Time For Goal Achievement: 07/22/11 Potential to Achieve Goals: Fair Pt will go Supine/Side to Sit: with min assist PT Goal: Supine/Side to Sit - Progress: Goal set today Pt will go Sit to Supine/Side: with supervision PT Goal: Sit to Supine/Side - Progress: Goal set today Pt will go Sit to Stand: with supervision PT Goal: Sit to Stand - Progress: Goal set today Pt will go Stand to Sit: with supervision PT Goal: Stand to Sit - Progress: Goal set today Pt will Transfer Bed to Chair/Chair to Bed: with min assist PT Transfer Goal: Bed to Chair/Chair to Bed - Progress: Goal set today Pt will Ambulate: 16 - 50 feet;with least restrictive assistive device;with min assist PT Goal: Ambulate - Progress: Goal set today  Visit Information  Last PT Received On: 07/08/11 Assistance Needed: +2    Subjective Data  Subjective: pt mostly nonverbal, but has good eye contact  Patient Stated Goal: none states   Prior Functioning  Communication Communication:  (pt intermittent follows commands, but does not answer questi)  Cognition  Overall Cognitive Status: No family/caregiver present to determine baseline cognitive  functioning Arousal/Alertness: Awake/alert Behavior During Session: [facility] for tasks performed (slow responses, protective of movement of knees) Current Attention Level: Selective Cognition - Other Comments: Pleasant, requiring cues to attend to task, but following simple commands    Extremity/Trunk Assessment Right Lower Extremity Assessment RLE ROM/Strength/Tone: Deficits RLE ROM/Strength/Tone Deficits: pt reluctant to bend knee.  Only after extra time and gentle A/AArom will she bend knee.  Bandaids over aspiration sites Left Lower Extremity Assessment LLE ROM/Strength/Tone: Deficits LLE ROM/Strength/Tone Deficits: very protective of bending knee.  She will actively bend it with extra time Trunk Assessment Trunk Assessment: Normal   Balance Static Sitting Balance Static Sitting - Balance Support: No upper extremity supported Static Sitting - Level of Assistance: 5: Stand by assistance Static Sitting - Comment/# of Minutes: 15  End of Session PT - End of Session Activity Tolerance: Patient limited by pain Patient left: in chair Nurse Communication: Need for lift equipment   Donnetta Hail 07/08/2011, 12:09 PM

## 2011-07-08 NOTE — Progress Notes (Signed)
PROGRESS NOTE  Kristi Snow MWU:[id number] DOB: November 09, 1924 DOA: 07/06/2011 PCP: Alva Garnet., MD, MD  Brief narrative: Kristi Snow is an 78 year old female with a PMH of advanced dementia, DM-2, HTN who was sent to the hospital from her SNF on 07/07/11 for evaluation of fever. Chest x-ray was clear but urinalysis shows signs of urinary tract infection, for which she is currently being treated.  Assessment/Plan: Principal Problem:  *Fever, likely from UTI  Patient was admitted and diagnostic and clinical information points to a urinary tract infection as being the source of her fever.  Empirically placed on Rocephin.  Fever curve down since admission.  Blood cultures and synovial fluid cultures negative to date. Urine culture pending. Active Problems:  Hypertension  Continue Norvasc,Hydralazine and isosorbide.  BP controlled.  Diabetes mellitus, uncontrolled  CBGs remained markedly elevated, 300-400s.  Last hemoglobin A1c was done on 06/13/2011 was 8.6%.  Increase Lantus to 15 units subcutaneous twice a day, continue insulin resistant sliding scale, and add 6 units of NovoLog before every meal for meal coverage.  Knee swelling, right  Knee films show severe osteoarthritis and CPPD disease.  Synovial fluid sent for culture, negative to date.  Dementia  Continue Seroquel, Cogentin, and Aricept.  Code Status: Full Family Communication: None at bedside. Disposition Plan: SNF when stable.  Medical Consultants:  None  Other consultants:  Physical Therapy  Antibiotics:  Rocephin 07/07/11--->   Subjective  Kristi Snow is resting comfortably. She is currently without complaints.   Objective    Interim History: Stable overnight.   Objective: Filed Vitals:   07/07/11 1639 07/07/11 2100 07/07/11 2300 07/08/11 0513  BP: 140/62 141/62  105/63  Pulse: 101 124  83  Temp: 98.5 F (36.9 C) 101.2 F (38.4 C) 102 F (38.9 C) 98.5 F (36.9 C)  TempSrc: Oral Axillary  Axillary Axillary  Resp: 20 20  16   Height: 5\' 6"  (1.676 m)     Weight: 69.4 kg (153 lb)     SpO2: 98% 98%  99%    Intake/Output Summary (Last 24 hours) at 07/08/11 0854 Last data filed at 07/07/11 2200  Gross per 24 hour  Intake  455.6 ml  Output      0 ml  Net  455.6 ml    Exam: Gen:  NAD Cardiovascular:  RRR, No M/R/G Respiratory: Lungs CTAB Gastrointestinal: Abdomen soft, NT/ND with normal active bowel sounds. Extremities: No C/E/C  Data Reviewed: Basic Metabolic Panel:  Lab 07/07/11 7253 07/06/11 2239  NA 138 136  K 4.0 3.7  CL 101 100  CO2 25 25  GLUCOSE 332* 276*  BUN 12 12  CREATININE 0.54 0.63  CALCIUM 8.9 8.8  MG -- --  PHOS -- --   GFR Estimated Creatinine Clearance: 49 ml/min (by C-G formula based on Cr of 0.54). Liver Function Tests:  Lab 07/07/11 1225  AST 16  ALT 8  ALKPHOS 113  BILITOT 0.7  PROT 7.4  ALBUMIN 3.1*   CBC:  Lab 07/07/11 1225 07/06/11 2239  WBC 8.0 7.8  NEUTROABS -- 6.2  HGB 12.2 12.1  HCT 37.0 36.7  MCV 89.2 89.5  PLT 287 253   CBG:  Lab 07/07/11 2206 07/07/11 1633 07/07/11 1303  GLUCAP 245* 408* 307*   Microbiology Recent Results (from the past 240 hour(s))  CULTURE, BLOOD (ROUTINE X 2)     Status: Normal (Preliminary result)   Collection Time   07/06/11 10:30 PM      Component Value Range Status  Comment   Specimen Description BLOOD RIGHT ANTECUBITAL   Final    Special Requests BOTTLES DRAWN AEROBIC AND ANAEROBIC 3CC   Final    Culture  Setup Time [id number]   Final    Culture     Final    Value:        BLOOD CULTURE RECEIVED NO GROWTH TO DATE CULTURE WILL BE HELD FOR 5 DAYS BEFORE ISSUING A FINAL NEGATIVE REPORT   Report Status PENDING   Incomplete   CULTURE, BLOOD (ROUTINE X 2)     Status: Normal (Preliminary result)   Collection Time   07/06/11 10:39 PM      Component Value Range Status Comment   Specimen Description BLOOD R WRIST   Final    Special Requests BOTTLES DRAWN AEROBIC ONLY 3CC   Final     Culture  Setup Time [id number]   Final    Culture     Final    Value:        BLOOD CULTURE RECEIVED NO GROWTH TO DATE CULTURE WILL BE HELD FOR 5 DAYS BEFORE ISSUING A FINAL NEGATIVE REPORT   Report Status PENDING   Incomplete   BODY FLUID CULTURE     Status: Normal (Preliminary result)   Collection Time   07/07/11  5:35 AM      Component Value Range Status Comment   Specimen Description SYNOVIAL   Final    Special Requests Normal   Final    Gram Stain     Final    Value: NO WBC SEEN     NO SQUAMOUS EPITHELIAL CELLS SEEN     NO ORGANISMS SEEN   Culture PENDING   Incomplete    Report Status PENDING   Incomplete     Procedures and Diagnostic Studies:  Dg Chest 2 View 07/06/2011 IMPRESSION: Markedly suboptimal inspiration accounts for atelectasis in the lower lobes, left greater than right.  No acute cardiopulmonary disease otherwise.  Borderline to mild cardiomegaly without pulmonary edema.  Original Report Authenticated By: Arnell Sieving, M.D.    Dg Knee Complete 4 Views Right 07/06/2011 IMPRESSION: Severe osteoarthritis, possibly secondary to CPPD.  Calcified intra- articular loose bodies.  Small to moderate sized joint effusion.  Original Report Authenticated By: Arnell Sieving, M.D.    Scheduled Meds:    . amLODipine  10 mg Oral Daily  . benztropine  1 mg Oral BID  . cefTRIAXone (ROCEPHIN)  IV  1 g Intravenous Q24H  . cilostazol  100 mg Oral BID  . diphenhydrAMINE  25 mg Oral BID  . donepezil  5 mg Oral QHS  . enoxaparin  40 mg Subcutaneous Q24H  . hydrALAZINE  10 mg Oral TID  . insulin aspart  0-20 Units Subcutaneous TID WC  . insulin aspart  0-5 Units Subcutaneous QHS  . insulin glargine  10 Units Subcutaneous BID  . isosorbide mononitrate  20 mg Oral BID  . QUEtiapine  12.5 mg Oral BID  . DISCONTD: diphenhydrAMINE  25 mg Oral BID   Continuous Infusions:    . sodium chloride 75 mL/hr at 07/08/11 0245  . DISCONTD: sodium chloride Stopped (07/07/11 1424)       LOS: 2 days   Hillery Aldo, MD Pager [phone]  07/08/2011, 8:54 AM

## 2011-07-08 NOTE — Progress Notes (Signed)
CARE MANAGEMENT NOTE 07/08/2011  Patient:  Tripp,Wayne   Account Number:  [id number]  Date Initiated:  07/08/2011  Documentation initiated by:  Yasmina Chico  Subjective/Objective Assessment:   patient from local snf with fever-103 and rt knee pain is currently being treated for uti and on po abx, poss arthro sepsis     Action/Plan:   snf-[facility]   Anticipated DC Date:  07/11/2011   Anticipated DC Plan:  SKILLED NURSING FACILITY  In-house referral  NA      DC Planning Services  NA      [facility] Choice  NA   Choice offered to / List presented to:  NA   DME arranged  NA      DME agency  NA     HH arranged  NA      HH agency  NA   Status of service:  In process, will continue to follow Medicare Important Message given?  NA - LOS <3 / Initial given by admissions (If response is "NO", the following Medicare IM given date fields will be blank) Date Medicare IM given:   Date Additional Medicare IM given:    Discharge Disposition:    Per UR Regulation:  Reviewed for med. necessity/level of care/duration of stay  If discussed at Long Length of Stay Meetings, dates discussed:    Comments:  [id number]/Tierria Watson Earlene Plater, RN, BSN, CCM No discharge needs present at time of this review at the sdu/icu level. Case Management [phone]

## 2011-07-09 ENCOUNTER — Encounter (HOSPITAL_COMMUNITY): Payer: Self-pay | Admitting: Internal Medicine

## 2011-07-09 DIAGNOSIS — R509 Fever, unspecified: Secondary | ICD-10-CM

## 2011-07-09 DIAGNOSIS — F0280 Dementia in other diseases classified elsewhere without behavioral disturbance: Secondary | ICD-10-CM

## 2011-07-09 DIAGNOSIS — G309 Alzheimer's disease, unspecified: Secondary | ICD-10-CM

## 2011-07-09 DIAGNOSIS — E782 Mixed hyperlipidemia: Secondary | ICD-10-CM

## 2011-07-09 DIAGNOSIS — M13169 Monoarthritis, not elsewhere classified, unspecified knee: Secondary | ICD-10-CM

## 2011-07-09 DIAGNOSIS — M119 Crystal arthropathy, unspecified: Secondary | ICD-10-CM

## 2011-07-09 HISTORY — DX: Crystal arthropathy, unspecified: M11.9

## 2011-07-09 LAB — GLUCOSE, CAPILLARY: Glucose-Capillary: 68 mg/dL — ABNORMAL LOW (ref 70–99)

## 2011-07-09 MED ORDER — INSULIN GLARGINE 100 UNIT/ML ~~LOC~~ SOLN: 5.0000 [IU] | SUBCUTANEOUS | Status: DC

## 2011-07-09 MED ORDER — INSULIN ASPART 100 UNIT/ML ~~LOC~~ SOLN: 6.0000 [IU] | Freq: Three times a day (TID) | SUBCUTANEOUS | Status: DC

## 2011-07-09 MED ORDER — CEFUROXIME AXETIL 250 MG PO TABS: 250.0000 mg | ORAL_TABLET | Freq: Two times a day (BID) | ORAL | Status: AC

## 2011-07-09 MED ORDER — FLUCONAZOLE 100 MG PO TABS
100.0000 mg | ORAL_TABLET | Freq: Every day | ORAL | Status: DC
  Administered 2011-07-09: 100 mg via ORAL
  Filled 2011-07-09: qty 1

## 2011-07-09 MED ORDER — INSULIN ASPART 100 UNIT/ML ~~LOC~~ SOLN: 0.0000 [IU] | Freq: Three times a day (TID) | SUBCUTANEOUS | Status: DC

## 2011-07-09 MED ORDER — FLUCONAZOLE 100 MG PO TABS: 100.0000 mg | ORAL_TABLET | Freq: Every day | ORAL | Status: AC

## 2011-07-09 NOTE — Progress Notes (Signed)
Pt to be d/c today to [facility].                      .  Pt and family agreeable. Confirmed plans with facility.  Plan transfer via EMS.   Leron Croak, LCSWA [employer] [phone]

## 2011-07-09 NOTE — Discharge Summary (Addendum)
Physician Discharge Summary  Patient ID: Kristi Snow MRN: [id number] DOB/AGE: [birth year] 76 y.o.  Admit date: 07/06/2011 Discharge date: 07/09/2011  Primary Care Physician:  Alva Garnet., MD, MD   Discharge Diagnoses:    .Fever .UTI (lower urinary tract infection): Preliminary cultures + yeast, GNR .Hypertension .Diabetes mellitus .Knee swelling, right secondary to CVVP disease, s/p arthrocentesis, synovial fluid culture negative .Dementia  Discharge Medications:  Medication List  As of 07/09/2011 10:18 AM   TAKE these medications         amLODipine 10 MG tablet   Commonly known as: NORVASC   Take 1 tablet (10 mg total) by mouth daily.      benztropine 1 MG tablet   Commonly known as: COGENTIN   Take 1 mg by mouth 2 (two) times daily.      cefUROXime 250 MG tablet   Commonly known as: CEFTIN   Take 1 tablet (250 mg total) by mouth 2 (two) times daily. For 5 days      cilostazol 100 MG tablet   Commonly known as: PLETAL   Take 100 mg by mouth 2 (two) times daily.      diphenhydrAMINE 25 MG tablet   Commonly known as: SOMINEX   Take 25 mg by mouth 2 (two) times daily.      donepezil 5 MG tablet   Commonly known as: ARICEPT   Take 5 mg by mouth daily.      fluconazole 100 MG tablet   Commonly known as: DIFLUCAN   Take 1 tablet (100 mg total) by mouth daily. Take for 7 days      hydrALAZINE 10 MG tablet   Commonly known as: APRESOLINE   Take 1 tablet (10 mg total) by mouth 3 (three) times daily.      insulin aspart 100 UNIT/ML injection   Commonly known as: novoLOG   Inject 0-10 Units into the skin 4 (four) times daily - after meals and at bedtime. CBG 70-120: 0 u; CBG 121-150: 3 u; CBG 151-200: 4 u; CBG 201-250: 7 u; CBG 251-300: 11 u;CBG 301-350: 15 u; CBG 351-400: 20 u; CBG > 400: call MD      insulin glargine 100 UNIT/ML injection   Commonly known as: LANTUS   Inject 5-10 Units into the skin See admin instructions.      isosorbide mononitrate 20 MG  tablet   Commonly known as: ISMO,MONOKET   Take 20 mg by mouth 2 (two) times daily.      QUEtiapine 25 MG tablet   Commonly known as: SEROQUEL   Take 12.5 mg by mouth 2 (two) times daily.             Disposition and Follow-up: The patient is being discharged back to her skilled nursing facility. Her family is interested in transfer to [place], but because of the holiday weekend, this cannot be accomplished at this time. Social worker has spoken with the patient's son about requesting transfer from facility to facility. She is instructed to followup with her primary care physician in one week.  Medical Consultants:  None Other consultants:  Physical Therapy: SNF placement recommended.  Procedures and Diagnostic Studies:   Dg Chest 2 View 07/06/2011  *RADIOLOGY REPORT*  Clinical Data: Fever.  Generalized weakness.  CHEST - 2 VIEW  Comparison: Portable chest x-ray 06/12/2011, 05/22/2011.  Findings: Markedly suboptimal inspiration accounts for atelectasis in the lower lobes, left greater than right.  This also accentuates the cardiac silhouette which is upper normal in  size to perhaps slightly enlarged.  Hilar and mediastinal contours otherwise unremarkable.  Lungs otherwise clear.  No pleural effusions. Degenerative changes involving the thoracic spine and both shoulders.  IMPRESSION: Markedly suboptimal inspiration accounts for atelectasis in the lower lobes, left greater than right.  No acute cardiopulmonary disease otherwise.  Borderline to mild cardiomegaly without pulmonary edema.  Original Report Authenticated By: Arnell Sieving, M.D.     Dg Knee Complete 4 Views Right 07/06/2011  *RADIOLOGY REPORT*  Clinical Data: 37-month history of right knee pain.  RIGHT KNEE - COMPLETE 4+ VIEW  Comparison: None.  Findings: Severe tricompartment joint space narrowing and associated hypertrophic spurring.  Calcified loose bodies within the joint.  Small to moderate sized joint effusion.  No  evidence of acute or subacute fracture or dislocation.  Femoropopliteal and tibioperoneal artery atherosclerosis.  IMPRESSION: Severe osteoarthritis, possibly secondary to CPPD.  Calcified intra- articular loose bodies.  Small to moderate sized joint effusion.  Original Report Authenticated By: Arnell Sieving, M.D.    Discharge Laboratory Values: Basic Metabolic Panel:  Lab 07/07/11 1610 07/06/11 2239  NA 138 136  K 4.0 3.7  CL 101 100  CO2 25 25  GLUCOSE 332* 276*  BUN 12 12  CREATININE 0.54 0.63  CALCIUM 8.9 8.8  MG -- --  PHOS -- --   GFR Estimated Creatinine Clearance: 49 ml/min (by C-G formula based on Cr of 0.54). Liver Function Tests:  Lab 07/07/11 1225  AST 16  ALT 8  ALKPHOS 113  BILITOT 0.7  PROT 7.4  ALBUMIN 3.1*   CBC:  Lab 07/07/11 1225 07/06/11 2239  WBC 8.0 7.8  NEUTROABS -- 6.2  HGB 12.2 12.1  HCT 37.0 36.7  MCV 89.2 89.5  PLT 287 253   CBG:  Lab 07/09/11 0820 07/09/11 0737 07/08/11 2151 07/08/11 1724 07/08/11 1150  GLUCAP 115* 68* 160* 152* 52*   Microbiology Recent Results (from the past 240 hour(s))  CULTURE, BLOOD (ROUTINE X 2)     Status: Normal (Preliminary result)   Collection Time   07/06/11 10:30 PM      Component Value Range Status Comment   Specimen Description BLOOD RIGHT ANTECUBITAL   Final    Special Requests BOTTLES DRAWN AEROBIC AND ANAEROBIC 3CC   Final    Culture  Setup Time [id number]   Final    Culture     Final    Value:        BLOOD CULTURE RECEIVED NO GROWTH TO DATE CULTURE WILL BE HELD FOR 5 DAYS BEFORE ISSUING A FINAL NEGATIVE REPORT   Report Status PENDING   Incomplete   CULTURE, BLOOD (ROUTINE X 2)     Status: Normal (Preliminary result)   Collection Time   07/06/11 10:39 PM      Component Value Range Status Comment   Specimen Description BLOOD R WRIST   Final    Special Requests BOTTLES DRAWN AEROBIC ONLY 3CC   Final    Culture  Setup Time [id number]   Final    Culture     Final    Value:        BLOOD  CULTURE RECEIVED NO GROWTH TO DATE CULTURE WILL BE HELD FOR 5 DAYS BEFORE ISSUING A FINAL NEGATIVE REPORT   Report Status PENDING   Incomplete   URINE CULTURE     Status: Normal   Collection Time   07/07/11  2:43 AM      Component Value Range Status Comment   Specimen  Description URINE, CATHETERIZED   Final    Special Requests NONE   Final    Culture  Setup Time [id number]   Final    Colony Count >=100,000 COLONIES/ML   Final    Culture YEAST   Final    Report Status 07/08/2011 FINAL   Final   BODY FLUID CULTURE     Status: Normal (Preliminary result)   Collection Time   07/07/11  5:35 AM      Component Value Range Status Comment   Specimen Description SYNOVIAL   Final    Special Requests Normal   Final    Gram Stain     Final    Value: NO WBC SEEN     NO SQUAMOUS EPITHELIAL CELLS SEEN     NO ORGANISMS SEEN   Culture NO GROWTH 1 DAY   Final    Report Status PENDING   Incomplete      Brief H and P: For complete details please refer to admission H and P, but in brief, Ms. Christensen is an 78 year old female with a PMH of advanced dementia, DM-2, HTN who was sent to the hospital from her SNF on 07/07/11 for evaluation of fever. Upon initial evaluation emergency department, the patient was found to have evidence of a urinary tract infection in addition to concerns for septic arthritis due to swelling of the right knee. Her chest x-ray was clear. She was referred to the hospitalist service for further evaluation and treatment.   Physical Exam at Discharge: BP 143/66  Pulse 81  Temp(Src) 97.8 F (36.6 C) (Oral)  Resp 16  Ht 5\' 6"  (1.676 m)  Wt 69.4 kg (153 lb)  BMI 24.69 kg/m2  SpO2 98% Gen:  NAD Cardiovascular:  RRR, No M/R/G Respiratory: Lungs CTAB Gastrointestinal: Abdomen soft, NT/ND with normal active bowel sounds. Extremities: No C/E/C, right knee swollen but without erythema, increased warmth   Hospital Course:  Principal Problem:  *Fever, likely from UTI  Patient was  admitted and diagnostic and clinical information pointed to a urinary tract infection as being the source of her fever.  Empirically placed on Rocephin.  Fever resolved.  Blood cultures and synovial fluid cultures negative to date. Preliminary urine culture positive for yeast and gram-negative rods. Patient will be discharged on an additional 5 days of therapy with Ceftin and a seven-day course of Diflucan. Active Problems:  Hypertension  Continue Norvasc,Hydralazine and isosorbide. BP controlled. Diabetes mellitus, uncontrolled  CBGs remained markedly elevated, 300-400s. She blood glucose control with Lantus 15 units twice a day along with insulin resistance sliding scale and 6 units of meal coverage. Blood glucoses significantly down over the past 24 hours and therefore insulin can be titrated down at discharge. Last hemoglobin A1c was done on 06/13/2011 was 8.6%.  Discharged on her usual dose of Lantus and sliding scale titrated up to the resistant scale. The meal coverage was discontinued. Knee swelling, right  Knee films show severe osteoarthritis and CPPD disease.  Synovial fluid sent for culture, negative to date. Dementia  Continue Seroquel, Cogentin, and Aricept.  Recommendations for hospital follow-up: 1. Close glycemic monitoring with adjustment of insulin. 2. Followup final synovial fluid cultures. 3. Followup final urine cultures.  Diet:  Carbohydrate modified.  Activity:  Increase activity slowly.  Condition at Discharge:   Improved.  Time spent on Discharge:  35 minutes.  Signed: Dr. Trula Ore Jozee Hammer Pager [phone] 07/09/2011, 10:18 AM

## 2011-07-09 NOTE — Discharge Instructions (Signed)

## 2011-07-10 LAB — BODY FLUID CULTURE

## 2011-07-11 NOTE — Progress Notes (Signed)
Clinical Social Work Department CLINICAL SOCIAL WORK PLACEMENT NOTE 07/11/2011  Patient:  GRACEANNE, GUIN  Account Number:  [id number] Admit date:  07/06/2011  Clinical Social Worker:  Tommi Emery, CLINICAL SOCIAL WORKER  Date/time:  07/11/2011 08:14 AM  Clinical Social Work is seeking post-discharge placement for this patient at the following level of care:   SKILLED NURSING   (*CSW will update this form in Epic as items are completed)   07/06/2011  Patient/family provided with [facility] Department of Clinical Social Work's list of facilities offering this level of care within the geographic area requested by the patient (or if unable, by the patient's family).  07/06/2011  Patient/family informed of their freedom to choose among providers that offer the needed level of care, that participate in Medicare, Medicaid or managed care program needed by the patient, have an available bed and are willing to accept the patient.  07/06/2011  Patient/family informed of [facility]' ownership interest in [facility], as well as of the fact that they are under no obligation to receive care at this facility.  PASARR submitted to [employer] on 07/06/2011 PASARR number received from [employer] on 07/06/2011  FL2 transmitted to all facilities in geographic area requested by pt/family on  07/06/2011 FL2 transmitted to all facilities within larger geographic area on 07/06/2011  Patient informed that his/her managed care company has contracts with or will negotiate with  certain facilities, including the following:     Patient/family informed of bed offers received:  07/06/2011 Patient chooses bed at [facility] Physician recommends and patient chooses bed at  [facility]  Patient to be transferred to [facility] on  07/06/2011 Patient to be transferred to facility by [name]  The following physician request were entered in Epic:   Additional  Comments:  Kimberlee Shoun C. Leighton Ruff [phone]

## 2011-07-13 LAB — CULTURE, BLOOD (ROUTINE X 2)
Culture  Setup Time: 201305230216
Culture: NO GROWTH

## 2011-10-03 ENCOUNTER — Encounter (HOSPITAL_COMMUNITY): Payer: Self-pay | Admitting: *Deleted

## 2011-10-03 ENCOUNTER — Emergency Department (HOSPITAL_COMMUNITY): Payer: Medicare Other

## 2011-10-03 ENCOUNTER — Inpatient Hospital Stay (HOSPITAL_COMMUNITY)
Admission: AD | Admit: 2011-10-03 | Discharge: 2011-10-06 | DRG: 689 | Disposition: A | Discharge status: Skilled Nursing Facility | Payer: Medicare Other | Attending: Internal Medicine | Admitting: Internal Medicine

## 2011-10-03 ENCOUNTER — Inpatient Hospital Stay (HOSPITAL_COMMUNITY): Payer: Medicare Other

## 2011-10-03 DIAGNOSIS — F068 Other specified mental disorders due to known physiological condition: Secondary | ICD-10-CM | POA: Diagnosis present

## 2011-10-03 DIAGNOSIS — R000 Tachycardia, unspecified: Secondary | ICD-10-CM | POA: Diagnosis present

## 2011-10-03 DIAGNOSIS — F0390 Unspecified dementia without behavioral disturbance: Secondary | ICD-10-CM | POA: Diagnosis present

## 2011-10-03 DIAGNOSIS — E119 Type 2 diabetes mellitus without complications: Secondary | ICD-10-CM

## 2011-10-03 DIAGNOSIS — E43 Unspecified severe protein-calorie malnutrition: Secondary | ICD-10-CM | POA: Diagnosis present

## 2011-10-03 DIAGNOSIS — R509 Fever, unspecified: Secondary | ICD-10-CM

## 2011-10-03 DIAGNOSIS — Z79899 Other long term (current) drug therapy: Secondary | ICD-10-CM

## 2011-10-03 DIAGNOSIS — I10 Essential (primary) hypertension: Secondary | ICD-10-CM | POA: Diagnosis present

## 2011-10-03 DIAGNOSIS — A498 Other bacterial infections of unspecified site: Secondary | ICD-10-CM | POA: Diagnosis present

## 2011-10-03 DIAGNOSIS — E46 Unspecified protein-calorie malnutrition: Secondary | ICD-10-CM

## 2011-10-03 DIAGNOSIS — M549 Dorsalgia, unspecified: Secondary | ICD-10-CM

## 2011-10-03 DIAGNOSIS — E1149 Type 2 diabetes mellitus with other diabetic neurological complication: Secondary | ICD-10-CM | POA: Diagnosis present

## 2011-10-03 DIAGNOSIS — IMO0002 Reserved for concepts with insufficient information to code with codable children: Secondary | ICD-10-CM

## 2011-10-03 DIAGNOSIS — Z794 Long term (current) use of insulin: Secondary | ICD-10-CM

## 2011-10-03 DIAGNOSIS — M6282 Rhabdomyolysis: Secondary | ICD-10-CM | POA: Diagnosis present

## 2011-10-03 DIAGNOSIS — E876 Hypokalemia: Secondary | ICD-10-CM

## 2011-10-03 DIAGNOSIS — N390 Urinary tract infection, site not specified: Principal | ICD-10-CM | POA: Diagnosis present

## 2011-10-03 DIAGNOSIS — R4182 Altered mental status, unspecified: Secondary | ICD-10-CM

## 2011-10-03 DIAGNOSIS — G9341 Metabolic encephalopathy: Secondary | ICD-10-CM | POA: Diagnosis present

## 2011-10-03 LAB — CARDIAC PANEL(CRET KIN+CKTOT+MB+TROPI)
Relative Index: 0.3 (ref 0.0–2.5)
Relative Index: 0.3 (ref 0.0–2.5)
Total CK: 1622 U/L — ABNORMAL HIGH (ref 7–177)
Total CK: 1348 U/L — ABNORMAL HIGH (ref 7–177)
Total CK: 1102 U/L — ABNORMAL HIGH (ref 7–177)
Troponin I: <0.3 ng/mL (ref <0.30)

## 2011-10-03 LAB — URINALYSIS, ROUTINE W REFLEX MICROSCOPIC
Glucose, UA: >1000 mg/dL — AB
Protein, ur: 30 mg/dL — AB
pH: 6 (ref 5.0–8.0)

## 2011-10-03 LAB — COMPREHENSIVE METABOLIC PANEL
ALT: 11 U/L (ref 0–35)
Albumin: 3.2 g/dL — ABNORMAL LOW (ref 3.5–5.2)
BUN: 11 mg/dL (ref 6–23)
CO2: 20 mEq/L (ref 19–32)
Calcium: 9.3 mg/dL (ref 8.4–10.5)
Calcium: 9.1 mg/dL (ref 8.4–10.5)
Chloride: 104 mEq/L (ref 96–112)
Creatinine, Ser: 0.64 mg/dL (ref 0.50–1.10)
GFR calc Af Amer: >90 mL/min (ref >90)
GFR calc non Af Amer: 84 mL/min — ABNORMAL LOW (ref >90)
Glucose, Bld: 270 mg/dL — ABNORMAL HIGH (ref 70–99)
Sodium: 140 mEq/L (ref 135–145)
Total Bilirubin: 0.8 mg/dL (ref 0.3–1.2)
Total Bilirubin: 0.7 mg/dL (ref 0.3–1.2)
Total Protein: 6.8 g/dL (ref 6.0–8.3)

## 2011-10-03 LAB — CBC WITH DIFFERENTIAL/PLATELET
Basophils Relative: 0 % (ref 0–1)
Eosinophils Absolute: 0 10*3/uL (ref 0.0–0.7)
Eosinophils Relative: 0 % (ref 0–5)
HCT: 35.7 % — ABNORMAL LOW (ref 36.0–46.0)
Hemoglobin: 12.1 g/dL (ref 12.0–15.0)
MCH: 28.8 pg (ref 26.0–34.0)
MCHC: 33.9 g/dL (ref 30.0–36.0)
Monocytes Absolute: 0.6 10*3/uL (ref 0.1–1.0)
Monocytes Relative: 5 % (ref 3–12)

## 2011-10-03 LAB — BASIC METABOLIC PANEL
Calcium: 9 mg/dL (ref 8.4–10.5)
GFR calc Af Amer: >90 mL/min (ref >90)
GFR calc non Af Amer: 83 mL/min — ABNORMAL LOW (ref >90)
Potassium: 3.4 mEq/L — ABNORMAL LOW (ref 3.5–5.1)
Sodium: 139 mEq/L (ref 135–145)

## 2011-10-03 LAB — DIFFERENTIAL
Basophils Absolute: 0 10*3/uL (ref 0.0–0.1)
Basophils Relative: 0 % (ref 0–1)
Eosinophils Absolute: 0 10*3/uL (ref 0.0–0.7)
Neutro Abs: 9.8 10*3/uL — ABNORMAL HIGH (ref 1.7–7.7)
Neutrophils Relative %: 82 % — ABNORMAL HIGH (ref 43–77)

## 2011-10-03 LAB — GLUCOSE, CAPILLARY
Glucose-Capillary: 289 mg/dL — ABNORMAL HIGH (ref 70–99)
Glucose-Capillary: 183 mg/dL — ABNORMAL HIGH (ref 70–99)
Glucose-Capillary: 267 mg/dL — ABNORMAL HIGH (ref 70–99)

## 2011-10-03 LAB — TSH: TSH: 1.179 u[IU]/mL (ref 0.350–4.500)

## 2011-10-03 LAB — CBC
Hemoglobin: 12 g/dL (ref 12.0–15.0)
MCHC: 34.1 g/dL (ref 30.0–36.0)
Platelets: 218 10*3/uL (ref 150–400)
RDW: 14.4 % (ref 11.5–15.5)

## 2011-10-03 LAB — URINE MICROSCOPIC-ADD ON

## 2011-10-03 MED ORDER — DONEPEZIL HCL 5 MG PO TABS
5.0000 mg | ORAL_TABLET | Freq: Every day | ORAL | Status: DC
  Administered 2011-10-03 – 2011-10-06 (×4): 5 mg via ORAL
  Filled 2011-10-03 (×4): qty 1

## 2011-10-03 MED ORDER — ISOSORBIDE MONONITRATE 20 MG PO TABS: 20.0000 mg | ORAL_TABLET | Freq: Two times a day (BID) | ORAL | Status: DC

## 2011-10-03 MED ORDER — HYDRALAZINE HCL 10 MG PO TABS
10.0000 mg | ORAL_TABLET | Freq: Three times a day (TID) | ORAL | Status: DC
  Administered 2011-10-03 – 2011-10-06 (×10): 10 mg via ORAL
  Filled 2011-10-03 (×12): qty 1

## 2011-10-03 MED ORDER — CILOSTAZOL 100 MG PO TABS
100.0000 mg | ORAL_TABLET | Freq: Two times a day (BID) | ORAL | Status: DC
  Administered 2011-10-03 – 2011-10-06 (×7): 100 mg via ORAL
  Filled 2011-10-03 (×8): qty 1

## 2011-10-03 MED ORDER — DEXTROSE 5 % IV SOLN
1.0000 g | INTRAVENOUS | Status: DC
  Administered 2011-10-03 – 2011-10-04 (×2): 1 g via INTRAVENOUS
  Filled 2011-10-03 (×3): qty 10

## 2011-10-03 MED ORDER — INSULIN ASPART 100 UNIT/ML ~~LOC~~ SOLN
0.0000 [IU] | Freq: Every day | SUBCUTANEOUS | Status: DC
  Administered 2011-10-05: 4 [IU] via SUBCUTANEOUS

## 2011-10-03 MED ORDER — SODIUM CHLORIDE 0.9 % IJ SOLN
3.0000 mL | Freq: Two times a day (BID) | INTRAMUSCULAR | Status: DC
  Administered 2011-10-03 – 2011-10-04 (×2): 3 mL via INTRAVENOUS

## 2011-10-03 MED ORDER — INSULIN GLARGINE 100 UNIT/ML ~~LOC~~ SOLN
15.0000 [IU] | Freq: Every day | SUBCUTANEOUS | Status: DC
  Administered 2011-10-03 – 2011-10-06 (×4): 15 [IU] via SUBCUTANEOUS

## 2011-10-03 MED ORDER — ISOSORBIDE MONONITRATE 20 MG PO TABS
20.0000 mg | ORAL_TABLET | Freq: Two times a day (BID) | ORAL | Status: DC
  Administered 2011-10-03 – 2011-10-06 (×6): 20 mg via ORAL
  Filled 2011-10-03 (×7): qty 1

## 2011-10-03 MED ORDER — POTASSIUM CHLORIDE 10 MEQ/100ML IV SOLN
10.0000 meq | INTRAVENOUS | Status: AC
  Administered 2011-10-03: 10 meq via INTRAVENOUS
  Filled 2011-10-03: qty 100

## 2011-10-03 MED ORDER — ISOSORBIDE MONONITRATE 20 MG PO TABS
20.0000 mg | ORAL_TABLET | Freq: Two times a day (BID) | ORAL | Status: DC
  Administered 2011-10-03: 20 mg via ORAL
  Filled 2011-10-03 (×3): qty 1

## 2011-10-03 MED ORDER — POTASSIUM CHLORIDE 10 MEQ/100ML IV SOLN
10.0000 meq | INTRAVENOUS | Status: AC
  Administered 2011-10-03 (×2): 10 meq via INTRAVENOUS
  Filled 2011-10-03 (×2): qty 100

## 2011-10-03 MED ORDER — POTASSIUM CHLORIDE CRYS ER 20 MEQ PO TBCR
20.0000 meq | EXTENDED_RELEASE_TABLET | Freq: Three times a day (TID) | ORAL | Status: DC
  Administered 2011-10-03 (×3): 20 meq via ORAL
  Filled 2011-10-03 (×6): qty 1

## 2011-10-03 MED ORDER — VANCOMYCIN HCL 500 MG IV SOLR
500.0000 mg | Freq: Two times a day (BID) | INTRAVENOUS | Status: DC
  Administered 2011-10-03 – 2011-10-04 (×2): 500 mg via INTRAVENOUS
  Filled 2011-10-03 (×3): qty 500

## 2011-10-03 MED ORDER — ACETAMINOPHEN 650 MG RE SUPP
650.0000 mg | Freq: Once | RECTAL | Status: AC
  Administered 2011-10-03: 650 mg via RECTAL
  Filled 2011-10-03: qty 1

## 2011-10-03 MED ORDER — POTASSIUM CHLORIDE IN NACL 40-0.9 MEQ/L-% IV SOLN
INTRAVENOUS | Status: DC
  Administered 2011-10-03 – 2011-10-05 (×4): via INTRAVENOUS
  Filled 2011-10-03 (×6): qty 1000

## 2011-10-03 MED ORDER — VANCOMYCIN HCL IN DEXTROSE 1-5 GM/200ML-% IV SOLN
1000.0000 mg | Freq: Once | INTRAVENOUS | Status: AC
  Administered 2011-10-03: 1000 mg via INTRAVENOUS
  Filled 2011-10-03: qty 200

## 2011-10-03 MED ORDER — POTASSIUM CHLORIDE 20 MEQ PO PACK: 20.0000 meq | PACK | Freq: Three times a day (TID) | ORAL | Status: DC

## 2011-10-03 MED ORDER — PRO-STAT SUGAR FREE PO LIQD
30.0000 mL | Freq: Two times a day (BID) | ORAL | Status: DC
  Administered 2011-10-03 – 2011-10-06 (×6): 30 mL via ORAL
  Filled 2011-10-03 (×7): qty 30

## 2011-10-03 MED ORDER — AMLODIPINE BESYLATE 10 MG PO TABS
10.0000 mg | ORAL_TABLET | Freq: Every day | ORAL | Status: DC
  Administered 2011-10-03 – 2011-10-06 (×4): 10 mg via ORAL
  Filled 2011-10-03 (×4): qty 1

## 2011-10-03 MED ORDER — INSULIN ASPART 100 UNIT/ML ~~LOC~~ SOLN
0.0000 [IU] | Freq: Three times a day (TID) | SUBCUTANEOUS | Status: DC
  Administered 2011-10-03: 5 [IU] via SUBCUTANEOUS
  Administered 2011-10-03: 2 [IU] via SUBCUTANEOUS
  Administered 2011-10-03: 5 [IU] via SUBCUTANEOUS
  Administered 2011-10-04: 2 [IU] via SUBCUTANEOUS
  Administered 2011-10-04: 3 [IU] via SUBCUTANEOUS
  Administered 2011-10-04: 1 [IU] via SUBCUTANEOUS
  Administered 2011-10-05 – 2011-10-06 (×3): 3 [IU] via SUBCUTANEOUS
  Administered 2011-10-06: 2 [IU] via SUBCUTANEOUS

## 2011-10-03 MED ORDER — POTASSIUM CHLORIDE IN NACL 20-0.9 MEQ/L-% IV SOLN
INTRAVENOUS | Status: DC
  Administered 2011-10-03: 06:00:00 via INTRAVENOUS
  Filled 2011-10-03 (×2): qty 1000

## 2011-10-03 NOTE — ED Provider Notes (Signed)
History     CSN: [id number]  Arrival date & time 10/03/11  0009   First MD Initiated Contact with Patient 10/03/11 0015      Chief Complaint  Patient presents with  . Fever  . Altered Mental Status    (Consider location/radiation/quality/duration/timing/severity/associated sxs/prior treatment) HPI 78 year old female presents to emergency room  from nursing home via EMS with report of fever and altered mental status. Patient has history of dementia, cannot obtain history other than nursing home report. Patient was given Rocephin 1 g IM this evening. Patient has history of frequent UTIs. No reported cough, vomiting diarrhea. Unknown recent antibiotics other than the Rocephin given today.   Past Medical History  Diagnosis Date  . Hypertension   . Diabetes mellitus   . Encephalopathy, unspecified   . Urinary tract infection   . Dementia   . Prolapsed uterus   . Lumbar spinal stenosis   . Crystal arthritis 07/09/2011    Past Surgical History  Procedure Date  . No past surgeries     History reviewed. No pertinent family history.  History  Substance Use Topics  . Smoking status: Never Smoker   . Smokeless tobacco: Never Used  . Alcohol Use: No    OB History    Grav Para Term Preterm Abortions TAB SAB Ect Mult Living                  Review of Systems  Unable to perform ROS: Dementia    Allergies  Review of patient's allergies indicates no known allergies.  Home Medications   Current Outpatient Rx  Name Route Sig Dispense Refill  . AMLODIPINE BESYLATE 10 MG PO TABS Oral Take 1 tablet (10 mg total) by mouth daily.    Marland Kitchen BENZTROPINE MESYLATE 1 MG PO TABS Oral Take 1 mg by mouth 2 (two) times daily.    Marland Kitchen CILOSTAZOL 100 MG PO TABS Oral Take 100 mg by mouth 2 (two) times daily.      Marland Kitchen DIPHENHYDRAMINE HCL (SLEEP) 25 MG PO TABS Oral Take 25 mg by mouth 2 (two) times daily.    . DONEPEZIL HCL 5 MG PO TABS Oral Take 5 mg by mouth daily.     Marland Kitchen HYDRALAZINE HCL 10 MG PO  TABS Oral Take 1 tablet (10 mg total) by mouth 3 (three) times daily.    . INSULIN ASPART 100 UNIT/ML [place] SOLN Subcutaneous Inject 0-10 Units into the skin 4 (four) times daily - after meals and at bedtime. CBG 70-120: 0 u; CBG 121-150: 3 u; CBG 151-200: 4 u; CBG 201-250: 7 u; CBG 251-300: 11 u;CBG 301-350: 15 u; CBG 351-400: 20 u; CBG > 400: call MD 1 vial   . INSULIN GLARGINE 100 UNIT/ML [place] SOLN Subcutaneous Inject 5-10 Units into the skin See admin instructions. 10 mL   . ISOSORBIDE MONONITRATE 20 MG PO TABS Oral Take 20 mg by mouth 2 (two) times daily.     . QUETIAPINE FUMARATE 25 MG PO TABS Oral Take 12.5 mg by mouth 2 (two) times daily.      BP 150/52  Pulse 133  Temp 103.6 F (39.8 C) (Rectal)  Resp 28  SpO2 96%  Physical Exam  Constitutional:       Patient grunting to questions and exam. Patient very hot to touch  HENT:  Head: Normocephalic and atraumatic.  Nose: Nose normal.  Mouth/Throat: Oropharynx is clear and moist. No oropharyngeal exudate.  Neck: Neck supple. No JVD present. No tracheal  deviation present. No thyromegaly present.  Cardiovascular: Regular rhythm, normal heart sounds and intact distal pulses.  Exam reveals no gallop and no friction rub.   No murmur heard.      Tachycardia noted  Pulmonary/Chest: Effort normal and breath sounds normal. No stridor. No respiratory distress. She has no wheezes. She has no rales. She exhibits no tenderness.  Abdominal: Soft. Bowel sounds are normal. She exhibits no distension and no mass. There is no tenderness. There is no rebound and no guarding.  Musculoskeletal:       Contracture of left arm  Lymphadenopathy:    She has no cervical adenopathy.  Neurological:       Patient is awake, will look toward voice, does not follow commands or speak  Skin: Skin is warm and dry. No rash noted. No erythema. No pallor.    ED Course  Procedures (including critical care time)  Labs Reviewed  CBC WITH DIFFERENTIAL - Abnormal; Notable  for the following:    WBC 11.7 (*)     HCT 35.7 (*)     Neutrophils Relative 92 (*)     Neutro Abs 10.8 (*)     Lymphocytes Relative 3 (*)     Lymphs Abs 0.4 (*)     All other components within normal limits  COMPREHENSIVE METABOLIC PANEL - Abnormal; Notable for the following:    Sodium 134 (*)     Potassium 2.7 (*)     Glucose, Bld 291 (*)     Albumin 3.2 (*)     GFR calc non Af Amer 80 (*)     All other components within normal limits  URINALYSIS, ROUTINE W REFLEX MICROSCOPIC - Abnormal; Notable for the following:    APPearance CLOUDY (*)     Glucose, UA >1000 (*)     Hgb urine dipstick LARGE (*)     Ketones, ur 15 (*)     Protein, ur 30 (*)     Nitrite POSITIVE (*)     Leukocytes, UA LARGE (*)     All other components within normal limits  URINE MICROSCOPIC-ADD ON - Abnormal; Notable for the following:    Bacteria, UA MANY (*)     All other components within normal limits  CARDIAC PANEL(CRET KIN+CKTOT+MB+TROPI) - Abnormal; Notable for the following:    Total CK 1622 (*)     All other components within normal limits  LACTIC ACID, PLASMA  PROCALCITONIN  CULTURE, BLOOD (ROUTINE X 2)  CULTURE, BLOOD (ROUTINE X 2)  URINE CULTURE  VITAMIN B12  FOLATE RBC  SEDIMENTATION RATE  RPR  TSH  CARDIAC PANEL(CRET KIN+CKTOT+MB+TROPI)  CARDIAC PANEL(CRET KIN+CKTOT+MB+TROPI)  MRSA PCR SCREENING  HEMOGLOBIN A1C  COMPREHENSIVE METABOLIC PANEL  CBC  DIFFERENTIAL   Dg Chest Port 1 View  10/03/2011  *RADIOLOGY REPORT*  Clinical Data: Fever  PORTABLE CHEST - 1 VIEW  Comparison: 07/06/2011  Findings: Mild atelectasis or scarring in the left lower lobe is unchanged.  Negative for pneumonia.  Negative for heart failure. Possible small left effusion.  Degenerative changes in both shoulders.  IMPRESSION: Mild left lower lobe atelectasis or scarring, unchanged.  Possible small left effusion.  No definite pneumonia.  Original Report Authenticated By: Camelia Phenes, M.D.     1. UTI (lower  urinary tract infection)   2. Fever   3. Altered mental status   4. Dementia   5. Diabetes mellitus       MDM  78 year old female with fever and urinary tract  infection has received IM Rocephin. Will add on vancomycin. Will check chest x-ray, culture lactic acid pro calcitonin.        Olivia Mackie, MD 10/03/11 0730

## 2011-10-03 NOTE — Progress Notes (Signed)
ANTIBIOTIC CONSULT NOTE - INITIAL  Pharmacy Consult for Vancomycin Indication: UTI  No Known Allergies  Patient Measurements: Height: 5' 6.14" (168 cm) Weight: 153 lb (69.4 kg) IBW/kg (Calculated) : 59.63    Vital Signs: Temp: 101.6 F (38.7 C) (08/19 0225) Temp src: Rectal (08/19 0225) BP: 145/75 mmHg (08/19 0230) Pulse Rate: 111  (08/19 0230) Intake/Output from previous day:   Intake/Output from this shift:    Labs:  Basename 10/03/11 0050  WBC 11.7*  HGB 12.1  PLT 258  LABCREA --  CREATININE 0.64   Estimated Creatinine Clearance: 49.3 ml/min (by C-G formula based on Cr of 0.64). No results found for this basename: VANCOTROUGH:2,VANCOPEAK:2,VANCORANDOM:2,GENTTROUGH:2,GENTPEAK:2,GENTRANDOM:2,TOBRATROUGH:2,TOBRAPEAK:2,TOBRARND:2,AMIKACINPEAK:2,AMIKACINTROU:2,AMIKACIN:2, in the last 72 hours   Microbiology: No results found for this or any previous visit (from the past 720 hour(s)).  Medical History: Past Medical History  Diagnosis Date  . Hypertension   . Diabetes mellitus   . Encephalopathy, unspecified   . Urinary tract infection   . Dementia   . Prolapsed uterus   . Lumbar spinal stenosis   . Crystal arthritis 07/09/2011    Medications:  Scheduled:    . acetaminophen  650 mg Rectal Once  . cefTRIAXone (ROCEPHIN)  IV  1 g Intravenous Q24H  . potassium chloride  10 mEq Intravenous Q1 Hr x 3  . potassium chloride  10 mEq Intravenous Q1 Hr x 2  . vancomycin  1,000 mg Intravenous Once   Infusions:   Assessment:  78 year old female with AMS and recurrent UTI with fever  Rocephin and Vancomycin to begin for treatment of UTI  Patient received Vancomycin 1gm IV x 1 in ED @ 01:47  Goal of Therapy:  Vancomycin trough level 15-20 mcg/ml  Plan:  Measure antibiotic drug levels at steady state Follow up culture results Vancomycin 500mg  IV q12h   Merit Gadsby, Joselyn Glassman, PharmD 10/03/2011,3:41 AM

## 2011-10-03 NOTE — ED Notes (Signed)
Pt pulled out IV. Will place a new one.

## 2011-10-03 NOTE — H&P (Signed)
[facility] History and Physical  Kristi Snow ZOX:[id number] DOB: Apr 01, 1924 DOA: 10/03/2011  Referring physician: Norlene Snow PCP: Kristi Garnet., MD   Chief Complaint: ? Altered ms, uti  HPI:  78 yo female with dementia, recurrent uti, dm2, apparently presents with c/o altered ms from [facility].   Pt was sent to ED for evaluation.   In ED,  Pt found to be febrile and to have Uti.  CT brain pending. Pt will be admitted for uti.   Review of Systems:  Negative for all organ systems except for + above.   Past Medical History  Diagnosis Date  . Hypertension   . Diabetes mellitus   . Encephalopathy, unspecified   . Urinary tract infection   . Dementia   . Prolapsed uterus   . Lumbar spinal stenosis   . Crystal arthritis 07/09/2011   Past Surgical History  Procedure Date  . No past surgeries    Social History:  reports that she has never smoked. She has never used smokeless tobacco. She reports that she does not drink alcohol or use illicit drugs. Pt lives at [facility],  Unable to perform any ADLS  No Known Allergies  Family History  Problem Relation Age of Onset  . Family history unknown: Yes  fhx unknown due to dementia   (be sure to complete)  Prior to Admission medications   Medication Sig Start Date End Date Taking? Authorizing Provider  amLODipine (NORVASC) 10 MG tablet Take 1 tablet (10 mg total) by mouth daily. 06/14/11 06/13/12 Yes Kristi Sea, MD  cefTRIAXone (ROCEPHIN) 1 G injection Inject 1 g into the muscle daily. Patient is to take for 7 days for UTI; first dose was 10-02-11   Yes Historical Provider, MD  cilostazol (PLETAL) 100 MG tablet Take 100 mg by mouth 2 (two) times daily.     Yes Historical Provider, MD  diphenhydrAMINE (SOMINEX) 25 MG tablet Take 25 mg by mouth 2 (two) times daily.   Yes Historical Provider, MD  donepezil (ARICEPT) 5 MG tablet Take 5 mg by mouth daily.    Yes Historical Provider, MD  hydrALAZINE  (APRESOLINE) 10 MG tablet Take 1 tablet (10 mg total) by mouth 3 (three) times daily. 05/27/11 05/26/12 Yes Kristi Jenna Luo, MD  insulin aspart (NOVOLOG) 100 UNIT/ML injection Inject 0-10 Units into the skin 4 (four) times daily - after meals and at bedtime. CBG 70-120: 0 u; CBG 121-150: 3 u; CBG 151-200: 4 u; CBG 201-250: 7 u; CBG 251-300: 11 u;CBG 301-350: 15 u; CBG 351-400: 20 u; CBG > 400: call MD 07/09/11  Yes Kristi Bun Rama, MD  insulin glargine (LANTUS) 100 UNIT/ML injection Inject 5-10 Units into the skin See admin instructions. 07/09/11 07/08/12 Yes Kristi P Rama, MD  isosorbide mononitrate (ISMO,MONOKET) 20 MG tablet Take 20 mg by mouth 2 (two) times daily.    Yes Historical Provider, MD   Physical Exam: Filed Vitals:   10/03/11 0009 10/03/11 0150 10/03/11 0225 10/03/11 0230  BP: 150/52 144/49  145/75  Pulse: 133   111  Temp: 103.6 F (39.8 C)  101.6 F (38.7 C)   TempSrc: Rectal  Rectal   Resp: 28 21  18   SpO2: 96% 97%  100%     General:  Thin frail looking elderly african [place] female  Eyes: anicteric  ENT: mm dry  Neck: no jvd, no bruit, no tm  Cardiovascular: rrr s1, s2  Respiratory: ctab  Abdomen: soft, flat, nt, +  bs  Skin: no rash, no c/c/e  Musculoskeletal: wnl  Psychiatric: axox3  Neurologic: nonfocal,   Labs on Admission:  Basic Metabolic Panel:  Lab 10/03/11 1610  NA 134*  K 2.7*  CL 96  CO2 26  GLUCOSE 291*  BUN 11  CREATININE 0.64  CALCIUM 9.3  MG --  PHOS --   Liver Function Tests:  Lab 10/03/11 0050  AST 29  ALT 11  ALKPHOS 93  BILITOT 0.8  PROT 6.8  ALBUMIN 3.2*   No results found for this basename: LIPASE:5,AMYLASE:5 in the last 168 hours No results found for this basename: AMMONIA:5 in the last 168 hours CBC:  Lab 10/03/11 0050  WBC 11.7*  NEUTROABS 10.8*  HGB 12.1  HCT 35.7*  MCV 85.0  PLT 258   Cardiac Enzymes: No results found for this basename: CKTOTAL:5,CKMB:5,CKMBINDEX:5,TROPONINI:5 in the last 168  hours  BNP (last 3 results) No results found for this basename: PROBNP:3 in the last 8760 hours CBG: No results found for this basename: GLUCAP:5 in the last 168 hours  Radiological Exams on Admission: Dg Chest Port 1 View  10/03/2011  *RADIOLOGY REPORT*  Clinical Data: Fever  PORTABLE CHEST - 1 VIEW  Comparison: 07/06/2011  Findings: Mild atelectasis or scarring in the left lower lobe is unchanged.  Negative for pneumonia.  Negative for heart failure. Possible small left effusion.  Degenerative changes in both shoulders.  IMPRESSION: Mild left lower lobe atelectasis or scarring, unchanged.  Possible small left effusion.  No definite pneumonia.  Original Report Authenticated By: Camelia Snow, M.D.    EKG:  Assessment/Plan Active Problems:  Dementia  Hypertension  UTI (lower urinary tract infection)  Hypokalemia  Protein calorie malnutrition   1. AMS likely secondary to uti on dementia:  Check CT brain, b12 folate, esr, ana, rpr, tsh 2. Hypokalemia,  repelte potassium and recheck at 0900 3. Uti: tx with rocephin and vanco 4. Protein calorie malnutrition  tx with prostat 5. fsbs ac and qhs ,  ISS 6. Tachycardia: likely secondary to fever,   Ns iv,  Cycle cardiac makers  Code Status: Full code Family Communication: ??? No family Disposition Plan: SNF  Time spent: 45 minutes  Kristi Snow [facility] Pager  [phone]  If 7PM-7AM, please contact night-coverage [email or web address] Password [email or web address] 10/03/2011, 3:25 AM

## 2011-10-03 NOTE — ED Notes (Signed)
Per EMS: Patient from [facility] center with c/o fever and altered mental status. Pt RN sts temp was 102.5 upon EMS arrival, temp now 103.6 orally. Patient given 1gm IM rocephin at approximately 2045 tonight for unknown reason. Patient has hx of dementia, htn, UTI.

## 2011-10-03 NOTE — ED Notes (Signed)
Report given to floor RN

## 2011-10-03 NOTE — Care Management Note (Signed)
    Page 1 of 1   10/06/2011     1:28:34 PM   CARE MANAGEMENT NOTE 10/06/2011  Patient:  Kristi Snow, Kristi Snow   Account Number:  [id number]  Date Initiated:  10/03/2011  Documentation initiated by:  Lanier Clam  Subjective/Objective Assessment:   ADMITTED W/AMS,UTI.     Action/Plan:   FROM SNF-[facility].   Anticipated DC Date:  10/06/2011   Anticipated DC Plan:  SKILLED NURSING FACILITY  In-house referral  Clinical Social Worker      DC Planning Services  CM consult      Choice offered to / List presented to:             Status of service:  Completed, signed off Medicare Important Message given?   (If response is "NO", the following Medicare IM given date fields will be blank) Date Medicare IM given:   Date Additional Medicare IM given:    Discharge Disposition:  SKILLED NURSING FACILITY  Per UR Regulation:  Reviewed for med. necessity/level of care/duration of stay  If discussed at Long Length of Stay Meetings, dates discussed:    Comments:  10/03/11 [facility] RN,BSN [phone] [phone]

## 2011-10-03 NOTE — Progress Notes (Signed)
Patient seen and examined Agree with the plan outlined by Dr. Selena Batten Repeat BMP secondary to severe hypokalemia Mild rhabdomyolysis Follow urine culture CT scan reviewed No definite pneumonia on chest x-ray

## 2011-10-03 NOTE — Progress Notes (Signed)
INITIAL ADULT NUTRITION ASSESSMENT Date: 10/03/2011   Time: 9:46 AM Reason for Assessment: Low [place]  ASSESSMENT: Female 78 y.o.  Dx: UTI, AMS  Hx:  Past Medical History  Diagnosis Date  . Hypertension   . Diabetes mellitus   . Encephalopathy, unspecified   . Urinary tract infection   . Dementia   . Prolapsed uterus   . Lumbar spinal stenosis   . Crystal arthritis 07/09/2011   Past Surgical History  Procedure Date  . No past surgeries     Related Meds:     . acetaminophen  650 mg Rectal Once  . amLODipine  10 mg Oral Daily  . cefTRIAXone (ROCEPHIN)  IV  1 g Intravenous Q24H  . cilostazol  100 mg Oral BID  . donepezil  5 mg Oral Daily  . hydrALAZINE  10 mg Oral TID  . insulin aspart  0-5 Units Subcutaneous QHS  . insulin aspart  0-9 Units Subcutaneous TID WC  . isosorbide mononitrate  20 mg Oral BID AC  . potassium chloride  10 mEq Intravenous Q1 Hr x 3  . potassium chloride  10 mEq Intravenous Q1 Hr x 2  . sodium chloride  3 mL Intravenous Q12H  . vancomycin  500 mg Intravenous Q12H  . vancomycin  1,000 mg Intravenous Once     Ht: 5\' 4"  (162.6 cm)  Wt: 130 lb 4.7 oz (59.1 kg)  Ideal Wt: 54.7 kg  % Ideal Wt: 108  Usual Wt:  Wt Readings from Last 10 Encounters:  10/03/11 130 lb 4.7 oz (59.1 kg)  07/07/11 153 lb (69.4 kg)  06/14/11 153 lb (69.4 kg)  05/26/11 147 lb 4.3 oz (66.8 kg)  03/09/11 151 lb 3.8 oz (68.6 kg)  11/08/10 165 lb (74.844 kg)   % Usual Wt: 85% of weight 3 months ago  Body mass index is 22.36 kg/(m^2).  Labs:  CMP     Component Value Date/Time   NA 134* 10/03/2011 0050   K 2.7* 10/03/2011 0050   CL 96 10/03/2011 0050   CO2 26 10/03/2011 0050   GLUCOSE 291* 10/03/2011 0050   BUN 11 10/03/2011 0050   CREATININE 0.64 10/03/2011 0050   CALCIUM 9.3 10/03/2011 0050   PROT 6.8 10/03/2011 0050   ALBUMIN 3.2* 10/03/2011 0050   AST 29 10/03/2011 0050   ALT 11 10/03/2011 0050   ALKPHOS 93 10/03/2011 0050   BILITOT 0.8 10/03/2011 0050   GFRNONAA  80* 10/03/2011 0050   GFRAA >90 10/03/2011 0050    I/O last 3 completed shifts: In: 110 [I.V.:110] Out: -      Diet Order: Dysphagia 1 Nectar thick  Supplements/Tube Feeding:  none  IVF:    0.9 % NaCl with KCl 20 mEq / L Last Rate: 100 mL/hr at 10/03/11 0543    Estimated Nutritional Needs:   Kcal: 1500-1600 Protein: 60-70 gm Fluid: >1.5 L Food/Nutrition Related Hx: Diet hx unknown.  Weight loss of 20# in the past 3 months per weight records. Intake currently excellent per nurse.  Ate 100% lunch.  Pt exceeding current needs with current diet order.  NUTRITION DIAGNOSIS: Malnutrition  related to unknown cause AEB 15% weight loss in the past 3 months with presumed inadequate weight loss.  Decreased muscle mass.  MONITORING/EVALUATION(Goals): Monitor:  Diet tolerance and intake, labs, weight Goal:  Meet 100% estimated needs with po diet.  EDUCATION NEEDS: -No education needs identified at this time  INTERVENTION: Prostat per MD- will order bid Continue current diet.  Dietitian [phone]  DOCUMENTATION CODES Per approved criteria  -Not Applicable    Jeoffrey Massed 10/03/2011, 9:46 AM

## 2011-10-03 NOTE — ED Notes (Signed)
[id number]<UY> Expected date:10/02/11<BR> Expected time:11:36 PM<BR> Means of arrival:Ambulance<BR> Comments:<BR> High fever; elderly; poor urinary output

## 2011-10-04 ENCOUNTER — Inpatient Hospital Stay (HOSPITAL_COMMUNITY): Payer: Medicare Other

## 2011-10-04 DIAGNOSIS — M729 Fibroblastic disorder, unspecified: Secondary | ICD-10-CM

## 2011-10-04 LAB — GLUCOSE, CAPILLARY
Glucose-Capillary: 234 mg/dL — ABNORMAL HIGH (ref 70–99)
Glucose-Capillary: 149 mg/dL — ABNORMAL HIGH (ref 70–99)

## 2011-10-04 LAB — URIC ACID: Uric Acid, Serum: 2.9 mg/dL (ref 2.4–7.0)

## 2011-10-04 MED ORDER — POTASSIUM CHLORIDE CRYS ER 20 MEQ PO TBCR
30.0000 meq | EXTENDED_RELEASE_TABLET | Freq: Three times a day (TID) | ORAL | Status: DC
  Administered 2011-10-04 – 2011-10-06 (×7): 30 meq via ORAL
  Filled 2011-10-04 (×9): qty 1

## 2011-10-04 NOTE — Progress Notes (Addendum)
[facility] PROGRESS NOTE  Kristi Snow ZOX:[id number] DOB: [birth year] DOA: 10/03/2011 PCP: Alva Garnet., MD  Assessment/Plan: Active Problems:  Dementia  Hypertension  UTI (lower urinary tract infection)  Hypokalemia  Protein calorie malnutrition  Altered mental status  1. AMS likely secondary to uti on dementia: CT scan shows chronic microvascular ischemic changes, b12 folate, esr, ana, rpr, tsh 2. Hypokalemia, repelte potassium and recheck at 0900 3. Uti: tx with rocephin and dc  vanco urine culture shows no growth so far 4. Protein calorie malnutrition tx with prostat 5. fsbs ac and qhs , ISS 6. Tachycardia: likely secondary to fever, Ns iv , [employer] 7. Hip pain check x-rays     Code Status: full Family Communication: family updated about patient's clinical progress Disposition Plan:  As above    Brief narrative: 78 yo female with dementia, recurrent uti, dm2, apparently presents with c/o altered ms from [facility]. Pt was sent to ED for evaluation   Consultants:  NONE  Procedures: NONE  Antibiotics:  NONE   HPI/Subjective: CONFUSED, LEG PAIN  Objective: Filed Vitals:   10/03/11 1530 10/03/11 2218 10/04/11 0335 10/04/11 0655  BP: 124/55 123/64 145/55 127/78  Pulse: 115  87 102  Temp: 99.7 F (37.6 C) 98 F (36.7 C) 99.3 F (37.4 C) 98.1 F (36.7 C)  TempSrc: Oral Axillary Axillary Axillary  Resp: 16 16 20 20   Height:      Weight:   59.2 kg (130 lb 8.2 oz)   SpO2: 95% 100% 100% 99%    Intake/Output Summary (Last 24 hours) at 10/04/11 1230 Last data filed at 10/04/11 0700  Gross per 24 hour  Intake 1683.75 ml  Output      0 ml  Net 1683.75 ml    Exam:  General: Thin frail looking elderly african [place] female  Eyes: anicteric  ENT: mm dry  Neck: no jvd, no bruit, no tm  Cardiovascular: rrr s1, s2  Respiratory: ctab  Abdomen: soft, flat, nt, +bs  Skin: no rash, no c/c/e  Musculoskeletal: wnl   Psychiatric: axox3  Neurologic: nonfocal,      Data Reviewed: Basic Metabolic Panel:  Lab 10/03/11 8119 10/03/11 0940 10/03/11 0050  NA 139 140 134*  K 3.4* 3.6 2.7*  CL 104 104 96  CO2 22 20 26   GLUCOSE 309* 270* 291*  BUN 8 7 11   CREATININE 0.56 0.54 0.64  CALCIUM 9.0 9.1 9.3  MG -- 1.8 --  PHOS -- -- --    Liver Function Tests:  Lab 10/03/11 0940 10/03/11 0050  AST 29 29  ALT 11 11  ALKPHOS 83 93  BILITOT 0.7 0.8  PROT 6.3 6.8  ALBUMIN 2.7* 3.2*   No results found for this basename: LIPASE:5,AMYLASE:5 in the last 168 hours No results found for this basename: AMMONIA:5 in the last 168 hours  CBC:  Lab 10/03/11 0940 10/03/11 0050  WBC 12.1* 11.7*  NEUTROABS 9.8* 10.8*  HGB 12.0 12.1  HCT 35.2* 35.7*  MCV 85.6 85.0  PLT 218 258    Cardiac Enzymes:  Lab 10/03/11 1535 10/03/11 0940 10/03/11 0459  CKTOTAL 1102* 1348* 1622*  CKMB 3.2 3.6 3.6  CKMBINDEX -- -- --  TROPONINI <0.30 <0.30 <0.30   BNP (last 3 results) No results found for this basename: PROBNP:3 in the last 8760 hours   CBG:  Lab 10/04/11 0750 10/03/11 2159 10/03/11 1746 10/03/11 1242 10/03/11 0815  GLUCAP 179* 196* 267* 183* 289*  Recent Results (from the past 240 hour(s))  CULTURE, BLOOD (ROUTINE X 2)     Status: Normal (Preliminary result)   Collection Time   10/03/11 12:50 AM      Component Value Range Status Comment   Specimen Description BLOOD RIGHT ARM   Final    Special Requests BOTTLES DRAWN AEROBIC AND ANAEROBIC 10 CC EA   Final    Culture  Setup Time 10/03/2011 09:11   Final    Culture     Final    Value:        BLOOD CULTURE RECEIVED NO GROWTH TO DATE CULTURE WILL BE HELD FOR 5 DAYS BEFORE ISSUING A FINAL NEGATIVE REPORT   Report Status PENDING   Incomplete   CULTURE, BLOOD (ROUTINE X 2)     Status: Normal (Preliminary result)   Collection Time   10/03/11  1:00 AM      Component Value Range Status Comment   Specimen Description BLOOD LEFT HAND   Final    Special  Requests BOTTLES DRAWN AEROBIC AND ANAEROBIC 10 CC EA   Final    Culture  Setup Time 10/03/2011 09:11   Final    Culture     Final    Value:        BLOOD CULTURE RECEIVED NO GROWTH TO DATE CULTURE WILL BE HELD FOR 5 DAYS BEFORE ISSUING A FINAL NEGATIVE REPORT   Report Status PENDING   Incomplete   MRSA PCR SCREENING     Status: Normal   Collection Time   10/03/11  4:11 AM      Component Value Range Status Comment   MRSA by PCR NEGATIVE  NEGATIVE Final      Studies: Dg Hip Bilateral W/pelvis  10/04/2011  *RADIOLOGY REPORT*  Clinical Data: Bilateral hip pain and knee pain. No trauma history submitted.  BILATERAL HIP WITH PELVIS - 4+ VIEW  Comparison: 06/23/2011 pelvic films.  Findings: AP view pelvis demonstrates probable calcified dystrophic uterine fibroids.  Both femoral heads are located.  Vascular calcifications.  Mild joint space involving the weightbearing surface of bilateral hips.  2 views of the bilateral hips demonstrate no acute fracture.  IMPRESSION:  1. Degenerarative change, without acute osseous abnormality. 2.  Probable uterine fibroids.   Original Report Authenticated By: Consuello Bossier, M.D.    Dg Knee 1-2 Views Right  10/04/2011  *RADIOLOGY REPORT*  Clinical Data: Bilateral hip pain, right knee pain.  RIGHT KNEE - 1-2 VIEW  Comparison: 07/06/2011  Findings: Advanced degenerative changes within the right knee, stable.  Near complete joint space loss.  Severe spurring. No acute bony abnormality.  Specifically, no fracture, subluxation, or dislocation.  Soft tissues are intact.  No joint effusion. Vascular calcifications stable.  IMPRESSION: Advanced degenerative changes.  No acute findings.   Original Report Authenticated By: Cyndie Chime, M.D.    Ct Head Wo Contrast  10/03/2011  *RADIOLOGY REPORT*  Clinical Data: Altered mental status.  CT HEAD WITHOUT CONTRAST  Technique:  Contiguous axial images were obtained from the base of the skull through the vertex without contrast.   Comparison: 06/23/2011.  Findings: Technically limited study due to motion artifact, requiring multiple repeat images.  Diffuse cerebral atrophy. Ventricular dilatation likely to represent central atrophy although normal pressure hydrocephalus is not excluded.  Low attenuation change in the deep white matter consistent with small vessel ischemia.  No mass effect or midline shift.  No abnormal extra- axial fluid collections.  Gray-white matter junctions appear distinct.  Basal cisterns are not effaced.  No evidence of acute intracranial hemorrhage.  No significant changes since previous study.  Mild mucosal membrane thickening in the paranasal sinuses. No depressed skull fractures.  IMPRESSION: Motion artifact limits the technical quality of the study.  No gross evidence of acute intracranial abnormalities.  Chronic atrophy and small vessel ischemic changes.  Original Report Authenticated By: Marlon Pel, M.D.   Dg Chest Port 1 View  10/03/2011  *RADIOLOGY REPORT*  Clinical Data: Fever  PORTABLE CHEST - 1 VIEW  Comparison: 07/06/2011  Findings: Mild atelectasis or scarring in the left lower lobe is unchanged.  Negative for pneumonia.  Negative for heart failure. Possible small left effusion.  Degenerative changes in both shoulders.  IMPRESSION: Mild left lower lobe atelectasis or scarring, unchanged.  Possible small left effusion.  No definite pneumonia.  Original Report Authenticated By: Camelia Phenes, M.D.    Scheduled Meds:   . amLODipine  10 mg Oral Daily  . cefTRIAXone (ROCEPHIN)  IV  1 g Intravenous Q24H  . cilostazol  100 mg Oral BID  . donepezil  5 mg Oral Daily  . feeding supplement  30 mL Oral BID AC  . hydrALAZINE  10 mg Oral TID  . insulin aspart  0-5 Units Subcutaneous QHS  . insulin aspart  0-9 Units Subcutaneous TID WC  . insulin glargine  15 Units Subcutaneous Daily  . isosorbide mononitrate  20 mg Oral BID  . potassium chloride  30 mEq Oral TID  . sodium chloride  3 mL  Intravenous Q12H  . vancomycin  500 mg Intravenous Q12H  . DISCONTD: isosorbide mononitrate  20 mg Oral BID AC  . DISCONTD: isosorbide mononitrate  20 mg Oral BID  . DISCONTD: potassium chloride  20 mEq Oral TID   Continuous Infusions:   . 0.9 % NaCl with KCl 40 mEq / L 75 mL/hr at 10/04/11 0205    Active Problems:  Dementia  Hypertension  UTI (lower urinary tract infection)  Hypokalemia  Protein calorie malnutrition  Altered mental status    Time spent: 40 minutes   [facility]  [facility] Pager [phone]. If 8PM-8AM, please contact night-coverage at [email or web address], password [facility] 10/04/2011, 12:30 PM  LOS: 1 day

## 2011-10-04 NOTE — Clinical Social Work Psychosocial (Unsigned)
     Clinical Social Work Department BRIEF PSYCHOSOCIAL ASSESSMENT 10/04/2011  Patient:  Kristi Snow, Kristi Snow     Account Number:  [id number]     Admit date:  10/03/2011  Clinical Social Worker:  Hattie Perch  Date/Time:  10/04/2011 12:00 M  Referred by:  Physician  Date Referred:  10/04/2011 Referred for  SNF Placement   Other Referral:   Interview type:  Family Other interview type:    PSYCHOSOCIAL DATA Living Status:  FACILITY Admitted from facility:  [facility] Level of care:  Skilled Nursing Facility Primary support name:  Web designer Primary support relationship to patient:  CHILD, ADULT Degree of support available:   good    CURRENT CONCERNS Current Concerns  Post-Acute Placement   Other Concerns:    SOCIAL WORK ASSESSMENT / PLAN CSW met with patient. patient is not oriented at all. CSW called patients son, Kristi Snow, he states that patient was a long term care resident at [facility] and will return upon discharge.   Assessment/plan status:   Other assessment/ plan:   Information/referral to community resources:    PATIENTS/FAMILYS RESPONSE TO PLAN OF CARE: agreeable to return to [facility] upon discharge.

## 2011-10-05 DIAGNOSIS — M549 Dorsalgia, unspecified: Secondary | ICD-10-CM

## 2011-10-05 LAB — BASIC METABOLIC PANEL
CO2: 22 mEq/L (ref 19–32)
Calcium: 9.1 mg/dL (ref 8.4–10.5)
Creatinine, Ser: 0.5 mg/dL (ref 0.50–1.10)

## 2011-10-05 LAB — GLUCOSE, CAPILLARY
Glucose-Capillary: 118 mg/dL — ABNORMAL HIGH (ref 70–99)
Glucose-Capillary: 234 mg/dL — ABNORMAL HIGH (ref 70–99)

## 2011-10-05 LAB — URINE CULTURE

## 2011-10-05 MED ORDER — CIPROFLOXACIN HCL 500 MG PO TABS
500.0000 mg | ORAL_TABLET | Freq: Two times a day (BID) | ORAL | Status: DC
  Administered 2011-10-05 – 2011-10-06 (×2): 500 mg via ORAL
  Filled 2011-10-05 (×4): qty 1

## 2011-10-05 MED ORDER — DEXTROSE-NACL 5-0.45 % IV SOLN
INTRAVENOUS | Status: DC
  Administered 2011-10-05 – 2011-10-06 (×2): via INTRAVENOUS

## 2011-10-05 NOTE — Progress Notes (Signed)
[facility] PROGRESS NOTE  Lenzi Marmo ZOX:[id number] DOB: 06-Mar-1924 DOA: 10/03/2011 PCP: Alva Garnet., MD  Assessment/Plan: Active Problems:  Dementia  Hypertension  UTI (lower urinary tract infection)  Hypokalemia  Protein calorie malnutrition  Altered mental status  1. AMS likely secondary to uti on dementia: CT scan shows chronic microvascular ischemic changes, normal folate, TSH and RPR. 2. Hypokalemia, repelte potassium and recheck at 0900 3. UTI: On Rocephin, urine culture shows pansensitive Escherichia coli, I'll switch to ciprofloxacin. 4. Protein calorie malnutrition tx with prostat 5. Diabetes mellitus type 2: Check CBGs ac and qhs , ISS and carbohydrate modified diet 6. Tachycardia: likely secondary to fever, better now was normal saline, negative cardiac markers 7. Hip pain: X-rays shows chronic degenerative changes, no acute issues.     Code Status: full Family Communication: family updated about patient's clinical progress Disposition Plan:  As above    Brief narrative: 78 yo female with dementia, recurrent uti, dm2, apparently presents with c/o altered ms from [facility]. Pt was sent to ED for evaluation   Consultants:  NONE  Procedures: NONE  Antibiotics:  NONE   HPI/Subjective: CONFUSED, LEG PAIN  Objective: Filed Vitals:   10/04/11 0655 10/04/11 1401 10/04/11 2206 10/05/11 0320  BP: 127/78 130/65 142/73 145/77  Pulse: 102 95 86 87  Temp: 98.1 F (36.7 C) 97.1 F (36.2 C) 97.6 F (36.4 C) 98.7 F (37.1 C)  TempSrc: Axillary Oral Oral Axillary  Resp: 20 20 20 20   Height:      Weight:    58.9 kg (129 lb 13.6 oz)  SpO2: 99% 99% 100% 100%    Intake/Output Summary (Last 24 hours) at 10/05/11 1354 Last data filed at 10/05/11 1300  Gross per 24 hour  Intake   2210 ml  Output      0 ml  Net   2210 ml    Exam:  General: Thin frail looking elderly african [place] female  Eyes: anicteric  ENT: mm dry    Neck: no jvd, no bruit, no tm  Cardiovascular: rrr s1, s2  Respiratory: ctab  Abdomen: soft, flat, nt, +bs  Skin: no rash, no c/c/e  Musculoskeletal: wnl  Psychiatric: axox3  Neurologic: nonfocal,      Data Reviewed: Basic Metabolic Panel:  Lab 10/05/11 8119 10/03/11 1535 10/03/11 0940 10/03/11 0050  NA 144 139 140 134*  K 4.5 3.4* 3.6 2.7*  CL 115* 104 104 96  CO2 22 22 20 26   GLUCOSE 103* 309* 270* 291*  BUN 8 8 7 11   CREATININE 0.50 0.56 0.54 0.64  CALCIUM 9.1 9.0 9.1 9.3  MG -- -- 1.8 --  PHOS -- -- -- --    Liver Function Tests:  Lab 10/03/11 0940 10/03/11 0050  AST 29 29  ALT 11 11  ALKPHOS 83 93  BILITOT 0.7 0.8  PROT 6.3 6.8  ALBUMIN 2.7* 3.2*   No results found for this basename: LIPASE:5,AMYLASE:5 in the last 168 hours No results found for this basename: AMMONIA:5 in the last 168 hours  CBC:  Lab 10/03/11 0940 10/03/11 0050  WBC 12.1* 11.7*  NEUTROABS 9.8* 10.8*  HGB 12.0 12.1  HCT 35.2* 35.7*  MCV 85.6 85.0  PLT 218 258    Cardiac Enzymes:  Lab 10/03/11 1535 10/03/11 0940 10/03/11 0459  CKTOTAL 1102* 1348* 1622*  CKMB 3.2 3.6 3.6  CKMBINDEX -- -- --  TROPONINI <0.30 <0.30 <0.30   BNP (last 3 results) No results found for this basename:  PROBNP:3 in the last 8760 hours   CBG:  Lab 10/05/11 1205 10/05/11 0738 10/04/11 2312 10/04/11 1713 10/04/11 1231  GLUCAP 234* 118* 182* 149* 234*    Recent Results (from the past 240 hour(s))  URINE CULTURE     Status: Normal   Collection Time   10/03/11 12:28 AM      Component Value Range Status Comment   Specimen Description URINE, CATHETERIZED   Final    Special Requests NONE   Final    Culture  Setup Time 10/03/2011 11:36   Final    Colony Count 30,000 COLONIES/ML   Final    Culture     Final    Value: ESCHERICHIA COLI     YEAST   Report Status 10/05/2011 FINAL   Final    Organism ID, Bacteria ESCHERICHIA COLI   Final   CULTURE, BLOOD (ROUTINE X 2)     Status: Normal (Preliminary  result)   Collection Time   10/03/11 12:50 AM      Component Value Range Status Comment   Specimen Description BLOOD RIGHT ARM   Final    Special Requests BOTTLES DRAWN AEROBIC AND ANAEROBIC 10 CC EA   Final    Culture  Setup Time 10/03/2011 09:11   Final    Culture     Final    Value:        BLOOD CULTURE RECEIVED NO GROWTH TO DATE CULTURE WILL BE HELD FOR 5 DAYS BEFORE ISSUING A FINAL NEGATIVE REPORT   Report Status PENDING   Incomplete   CULTURE, BLOOD (ROUTINE X 2)     Status: Normal (Preliminary result)   Collection Time   10/03/11  1:00 AM      Component Value Range Status Comment   Specimen Description BLOOD LEFT HAND   Final    Special Requests BOTTLES DRAWN AEROBIC AND ANAEROBIC 10 CC EA   Final    Culture  Setup Time 10/03/2011 09:11   Final    Culture     Final    Value:        BLOOD CULTURE RECEIVED NO GROWTH TO DATE CULTURE WILL BE HELD FOR 5 DAYS BEFORE ISSUING A FINAL NEGATIVE REPORT   Report Status PENDING   Incomplete   MRSA PCR SCREENING     Status: Normal   Collection Time   10/03/11  4:11 AM      Component Value Range Status Comment   MRSA by PCR NEGATIVE  NEGATIVE Final      Studies: Dg Hip Bilateral W/pelvis  10/04/2011  *RADIOLOGY REPORT*  Clinical Data: Bilateral hip pain and knee pain. No trauma history submitted.  BILATERAL HIP WITH PELVIS - 4+ VIEW  Comparison: 06/23/2011 pelvic films.  Findings: AP view pelvis demonstrates probable calcified dystrophic uterine fibroids.  Both femoral heads are located.  Vascular calcifications.  Mild joint space involving the weightbearing surface of bilateral hips.  2 views of the bilateral hips demonstrate no acute fracture.  IMPRESSION:  1. Degenerarative change, without acute osseous abnormality. 2.  Probable uterine fibroids.   Original Report Authenticated By: Consuello Bossier, M.D.    Dg Knee 1-2 Views Right  10/04/2011  *RADIOLOGY REPORT*  Clinical Data: Bilateral hip pain, right knee pain.  RIGHT KNEE - 1-2 VIEW   Comparison: 07/06/2011  Findings: Advanced degenerative changes within the right knee, stable.  Near complete joint space loss.  Severe spurring. No acute bony abnormality.  Specifically, no fracture, subluxation, or dislocation.  Soft  tissues are intact.  No joint effusion. Vascular calcifications stable.  IMPRESSION: Advanced degenerative changes.  No acute findings.   Original Report Authenticated By: Cyndie Chime, M.D.    Ct Head Wo Contrast  10/03/2011  *RADIOLOGY REPORT*  Clinical Data: Altered mental status.  CT HEAD WITHOUT CONTRAST  Technique:  Contiguous axial images were obtained from the base of the skull through the vertex without contrast.  Comparison: 06/23/2011.  Findings: Technically limited study due to motion artifact, requiring multiple repeat images.  Diffuse cerebral atrophy. Ventricular dilatation likely to represent central atrophy although normal pressure hydrocephalus is not excluded.  Low attenuation change in the deep white matter consistent with small vessel ischemia.  No mass effect or midline shift.  No abnormal extra- axial fluid collections.  Gray-white matter junctions appear distinct.  Basal cisterns are not effaced.  No evidence of acute intracranial hemorrhage.  No significant changes since previous study.  Mild mucosal membrane thickening in the paranasal sinuses. No depressed skull fractures.  IMPRESSION: Motion artifact limits the technical quality of the study.  No gross evidence of acute intracranial abnormalities.  Chronic atrophy and small vessel ischemic changes.  Original Report Authenticated By: Marlon Pel, M.D.   Dg Chest Port 1 View  10/03/2011  *RADIOLOGY REPORT*  Clinical Data: Fever  PORTABLE CHEST - 1 VIEW  Comparison: 07/06/2011  Findings: Mild atelectasis or scarring in the left lower lobe is unchanged.  Negative for pneumonia.  Negative for heart failure. Possible small left effusion.  Degenerative changes in both shoulders.  IMPRESSION: Mild  left lower lobe atelectasis or scarring, unchanged.  Possible small left effusion.  No definite pneumonia.  Original Report Authenticated By: Camelia Phenes, M.D.    Scheduled Meds:    . amLODipine  10 mg Oral Daily  . cefTRIAXone (ROCEPHIN)  IV  1 g Intravenous Q24H  . cilostazol  100 mg Oral BID  . donepezil  5 mg Oral Daily  . feeding supplement  30 mL Oral BID AC  . hydrALAZINE  10 mg Oral TID  . insulin aspart  0-5 Units Subcutaneous QHS  . insulin aspart  0-9 Units Subcutaneous TID WC  . insulin glargine  15 Units Subcutaneous Daily  . isosorbide mononitrate  20 mg Oral BID  . potassium chloride  30 mEq Oral TID  . sodium chloride  3 mL Intravenous Q12H   Continuous Infusions:    . 0.9 % NaCl with KCl 40 mEq / L 75 mL/hr at 10/05/11 0411    Active Problems:  Dementia  Hypertension  UTI (lower urinary tract infection)  Hypokalemia  Protein calorie malnutrition  Altered mental status    Time spent: 40 minutes   [facility]  [facility] Pager [phone]. If 8PM-8AM, please contact night-coverage at [email or web address], password [facility] 10/05/2011, 1:54 PM  LOS: 2 days

## 2011-10-05 NOTE — Clinical Documentation Improvement (Signed)
CHANGE MENTAL STATUS DOCUMENTATION CLARIFICATION   THIS DOCUMENT IS NOT A PERMANENT PART OF THE MEDICAL RECORD  TO RESPOND TO THE THIS QUERY, FOLLOW THE INSTRUCTIONS BELOW:  1. If needed, update documentation for the patient's encounter via the notes activity.  2. Access this query again and click edit on the In [employer].  3. After updating, or not, click F2 to complete all highlighted (required) fields concerning your review. Select "additional documentation in the medical record" OR "no additional documentation provided".  4. Click Sign note button.  5. The deficiency will fall out of your In Basket *Please let us know if you are not able to complete this workflow by phone or e-mail (listed below).         10/05/11  Dear Dr. Arthor Captain, Kristi Snow  In an effort to better capture your patient's severity of illness, reflect appropriate length of stay and utilization of resources, a review of the patient medical record has revealed the following indicators.    Based on your clinical judgment, please clarify and document in a progress note and/or discharge summary the clinical condition associated with the following supporting information:  In responding to this query please exercise your independent judgment.  The fact that a query is asked, does not imply that any particular answer is desired or expected.  Pt admitted for UTI  According to HP pt with AMS    Please clarify if pt's current AMS in setting of H/O of encephalopathy, dementia, and UTI can be further specified as one of the diagnoses listed below document in pn and d/c summary.   Thank you for all that you do for our patients!    Possible Clinical Conditions?  _______Encephalopathy (describe type if known)                       Anoxic                       Septic                       Alcoholic                        Hepatic                       Hypertensive                       Metabolic    _______ Toxic _______Traumatic brain injury  _______Other Condition__________________ _______Cannot Clinically Determine   Supporting Information: Risk Factors: UTI, dementia, H/O Encephalopathy, hypokalemia, Protein calorie malnutrition,   Signs & Symptoms: HP AMS likely secondary to uti on dementia:  Check CT brain, b12 folate, esr, ana, rpr, t Chief Complaint: ? Altered ms, uti  HPI:   78 yo female with dementia, recurrent uti, dm2, apparently presents with c/o altered ms from [facility].   Pt was sent to ED for evaluation.   In ED,  Pt found to be febrile and to have Uti.  CT brain pending. Pt will be admitted for uti.    Diagnostics: 10/03/11 Urine culture: E.Col   Treatment: vancomycin (VANCOCIN) 500 mg  donepezil (ARICEPT) tablet 5 mg  0.9 % NaCl with KCl 40 mEq / L  infusion  potassium chloride SA ([id number]  Reviewed: additional documentation in the medical record  Thank You,  Enis Slipper  RN, BSN, MSN/Inf, CCDS Clinical Documentation Specialist [facility] Pager: [phone] / E-mail: [email or web address]  Health Information Management [place]

## 2011-10-06 LAB — BASIC METABOLIC PANEL
BUN: 8 mg/dL (ref 6–23)
Creatinine, Ser: 0.5 mg/dL (ref 0.50–1.10)
GFR calc Af Amer: >90 mL/min (ref >90)
GFR calc non Af Amer: 86 mL/min — ABNORMAL LOW (ref >90)
Potassium: 4.7 mEq/L (ref 3.5–5.1)

## 2011-10-06 LAB — GLUCOSE, CAPILLARY: Glucose-Capillary: 296 mg/dL — ABNORMAL HIGH (ref 70–99)

## 2011-10-06 MED ORDER — HYDROCODONE-ACETAMINOPHEN 5-325 MG PO TABS
1.0000 | ORAL_TABLET | Freq: Three times a day (TID) | ORAL | Status: DC | PRN
  Administered 2011-10-06: 1 via ORAL
  Filled 2011-10-06: qty 1

## 2011-10-06 MED ORDER — CIPROFLOXACIN HCL 500 MG PO TABS: 500.0000 mg | ORAL_TABLET | Freq: Two times a day (BID) | ORAL | Status: AC

## 2011-10-06 MED ORDER — HYDROCODONE-ACETAMINOPHEN 5-325 MG PO TABS: 1.0000 | ORAL_TABLET | Freq: Four times a day (QID) | ORAL | Status: DC | PRN

## 2011-10-06 NOTE — Progress Notes (Signed)
Patient cleared for discharge. Packet copied and placed in [facility]. ptar called for transportation.  Rudolph Dobler C. Martasia Talamante MSW, LCSW [phone]  

## 2011-10-06 NOTE — Discharge Summary (Signed)
Physician Discharge Summary  Kristi Snow ZOX:[id number] DOB: Dec 04, 1924 DOA: 10/03/2011  PCP: Alva Garnet., MD  Admit date: 10/03/2011 Discharge date: 10/06/2011  Recommendations for Outpatient Follow-up:  1. Followup with the nursing home physician  Discharge Diagnoses:  Active Problems:  Dementia  Hypertension  UTI (lower urinary tract infection)  Hypokalemia  Protein calorie malnutrition  Altered mental status   Discharge Condition: Stable  Diet recommendation: Heart healthy diet  Filed Weights   10/04/11 0335 10/05/11 0320 10/06/11 0500  Weight: 59.2 kg (130 lb 8.2 oz) 58.9 kg (129 lb 13.6 oz) 60.2 kg (132 lb 11.5 oz)    History of present illness:  78 year old female with history of dementia, she is a nursing home resident she also have recurrent UTIs and diabetes mellitus. Patient was sent from her nursing home for further evaluation because of altered mental status. She was found to be febrile in the emergency department, her urinalysis was consistent with UTI.  Hospital Course:   1. UTI: At the time of admission patient was started on Rocephin, urine cultures showed pansensitive Escherichia coli. Day of discharge that was switched to ciprofloxacin and for patient to complete 7 days of antibiotics. The culture also grew some yeast but I think it was just a contaminant, if patient still febrile and has symptoms after treatment with antibiotic and antifungal can be considered.  2. Altered mental status: Acute metabolic encephalopathy secondary to UTI, patient has baseline of dementia and confusion. At the time of admission CT scan showed chronic microvascular ischemic changes, she has normal folate, TSH and RPR.  3. Hypokalemia: This is repleted by oral supplementation.  4. Diabetes mellitus type 2: Is uncontrolled with hemoglobin A1c of 9.3, target for the glycemic control should be higher than normal as patient is demented and I am not sure if she has hypoglycemic  awareness. Her medication continued.  5. Tachycardia: This is likely secondary to her fever and dehydration, improved with normal saline boluses, patient also has ACS ruled out by -3 sets of cardiac enzymes and EKG.  6. Protein calorie malnutrition: Severe malnutrition, patient is hypoalbuminemic to this is likely secondary to poor oral intake.  Procedures:  None  Consultations:  None  Discharge Exam: Filed Vitals:   10/06/11 0543  BP: 141/64  Pulse: 92  Temp: 97.6 F (36.4 C)  Resp: 18   Filed Vitals:   10/05/11 1500 10/05/11 2057 10/06/11 0500 10/06/11 0543  BP: 139/58 141/72  141/64  Pulse: 101 104  92  Temp: 98.5 F (36.9 C) 98.5 F (36.9 C)  97.6 F (36.4 C)  TempSrc: Oral Axillary  Axillary  Resp: 20 20  18   Height:      Weight:   60.2 kg (132 lb 11.5 oz)   SpO2: 100% 94%  98%   General: Patient is demented, she is confused and disoriented. HEENT: anicteric sclera, pupils reactive to light and accommodation, EOMI CVS: S1-S2 clear, no murmur rubs or gallops Chest: clear to auscultation bilaterally, no wheezing, rales or rhonchi Abdomen: soft nontender, nondistended, normal bowel sounds, no organomegaly Extremities: no cyanosis, clubbing or edema noted bilaterally Neuro: Cranial nerves II-XII intact, no focal neurological deficits  Discharge Instructions  Discharge Orders    Future Orders Please Complete By Expires   Diet - low sodium heart healthy      Increase activity slowly        Medication List  As of 10/06/2011 12:11 PM   STOP taking these medications  ROCEPHIN 1 G injection         TAKE these medications         amLODipine 10 MG tablet   Commonly known as: NORVASC   Take 1 tablet (10 mg total) by mouth daily.      cilostazol 100 MG tablet   Commonly known as: PLETAL   Take 100 mg by mouth 2 (two) times daily.      ciprofloxacin 500 MG tablet   Commonly known as: CIPRO   Take 1 tablet (500 mg total) by mouth 2 (two) times daily.       diphenhydrAMINE 25 MG tablet   Commonly known as: SOMINEX   Take 25 mg by mouth 2 (two) times daily.      donepezil 5 MG tablet   Commonly known as: ARICEPT   Take 5 mg by mouth daily.      hydrALAZINE 10 MG tablet   Commonly known as: APRESOLINE   Take 1 tablet (10 mg total) by mouth 3 (three) times daily.      insulin aspart 100 UNIT/ML injection   Commonly known as: novoLOG   Inject 0-10 Units into the skin 4 (four) times daily - after meals and at bedtime. CBG 70-120: 0 u; CBG 121-150: 3 u; CBG 151-200: 4 u; CBG 201-250: 7 u; CBG 251-300: 11 u;CBG 301-350: 15 u; CBG 351-400: 20 u; CBG > 400: call MD      insulin glargine 100 UNIT/ML injection   Commonly known as: LANTUS   Inject 5-10 Units into the skin See admin instructions.      isosorbide mononitrate 20 MG tablet   Commonly known as: ISMO,MONOKET   Take 20 mg by mouth 2 (two) times daily.           Follow-up Information    Follow up with Alva Garnet., MD in 1 week.   Contact information:   [address] [place] [address] [phone]           The results of significant diagnostics from this hospitalization (including imaging, microbiology, ancillary and laboratory) are listed below for reference.    Significant Diagnostic Studies: Dg Hip Bilateral W/pelvis  10/04/2011  *RADIOLOGY REPORT*  Clinical Data: Bilateral hip pain and knee pain. No trauma history submitted.  BILATERAL HIP WITH PELVIS - 4+ VIEW  Comparison: 06/23/2011 pelvic films.  Findings: AP view pelvis demonstrates probable calcified dystrophic uterine fibroids.  Both femoral heads are located.  Vascular calcifications.  Mild joint space involving the weightbearing surface of bilateral hips.  2 views of the bilateral hips demonstrate no acute fracture.  IMPRESSION:  1. Degenerarative change, without acute osseous abnormality. 2.  Probable uterine fibroids.   Original Report Authenticated By: Consuello Bossier,  M.D.    Dg Knee 1-2 Views Right  10/04/2011  *RADIOLOGY REPORT*  Clinical Data: Bilateral hip pain, right knee pain.  RIGHT KNEE - 1-2 VIEW  Comparison: 07/06/2011  Findings: Advanced degenerative changes within the right knee, stable.  Near complete joint space loss.  Severe spurring. No acute bony abnormality.  Specifically, no fracture, subluxation, or dislocation.  Soft tissues are intact.  No joint effusion. Vascular calcifications stable.  IMPRESSION: Advanced degenerative changes.  No acute findings.   Original Report Authenticated By: Cyndie Chime, M.D.    Ct Head Wo Contrast  10/03/2011  *RADIOLOGY REPORT*  Clinical Data: Altered mental status.  CT HEAD WITHOUT CONTRAST  Technique:  Contiguous axial images were obtained from  the base of the skull through the vertex without contrast.  Comparison: 06/23/2011.  Findings: Technically limited study due to motion artifact, requiring multiple repeat images.  Diffuse cerebral atrophy. Ventricular dilatation likely to represent central atrophy although normal pressure hydrocephalus is not excluded.  Low attenuation change in the deep white matter consistent with small vessel ischemia.  No mass effect or midline shift.  No abnormal extra- axial fluid collections.  Gray-white matter junctions appear distinct.  Basal cisterns are not effaced.  No evidence of acute intracranial hemorrhage.  No significant changes since previous study.  Mild mucosal membrane thickening in the paranasal sinuses. No depressed skull fractures.  IMPRESSION: Motion artifact limits the technical quality of the study.  No gross evidence of acute intracranial abnormalities.  Chronic atrophy and small vessel ischemic changes.  Original Report Authenticated By: Marlon Pel, M.D.   Dg Chest Port 1 View  10/03/2011  *RADIOLOGY REPORT*  Clinical Data: Fever  PORTABLE CHEST - 1 VIEW  Comparison: 07/06/2011  Findings: Mild atelectasis or scarring in the left lower lobe is unchanged.   Negative for pneumonia.  Negative for heart failure. Possible small left effusion.  Degenerative changes in both shoulders.  IMPRESSION: Mild left lower lobe atelectasis or scarring, unchanged.  Possible small left effusion.  No definite pneumonia.  Original Report Authenticated By: Camelia Phenes, M.D.    Microbiology: Recent Results (from the past 240 hour(s))  URINE CULTURE     Status: Normal   Collection Time   10/03/11 12:28 AM      Component Value Range Status Comment   Specimen Description URINE, CATHETERIZED   Final    Special Requests NONE   Final    Culture  Setup Time 10/03/2011 11:36   Final    Colony Count 30,000 COLONIES/ML   Final    Culture     Final    Value: ESCHERICHIA COLI     YEAST   Report Status 10/05/2011 FINAL   Final    Organism ID, Bacteria ESCHERICHIA COLI   Final   CULTURE, BLOOD (ROUTINE X 2)     Status: Normal (Preliminary result)   Collection Time   10/03/11 12:50 AM      Component Value Range Status Comment   Specimen Description BLOOD RIGHT ARM   Final    Special Requests BOTTLES DRAWN AEROBIC AND ANAEROBIC 10 CC EA   Final    Culture  Setup Time 10/03/2011 09:11   Final    Culture     Final    Value:        BLOOD CULTURE RECEIVED NO GROWTH TO DATE CULTURE WILL BE HELD FOR 5 DAYS BEFORE ISSUING A FINAL NEGATIVE REPORT   Report Status PENDING   Incomplete   CULTURE, BLOOD (ROUTINE X 2)     Status: Normal (Preliminary result)   Collection Time   10/03/11  1:00 AM      Component Value Range Status Comment   Specimen Description BLOOD LEFT HAND   Final    Special Requests BOTTLES DRAWN AEROBIC AND ANAEROBIC 10 CC EA   Final    Culture  Setup Time 10/03/2011 09:11   Final    Culture     Final    Value:        BLOOD CULTURE RECEIVED NO GROWTH TO DATE CULTURE WILL BE HELD FOR 5 DAYS BEFORE ISSUING A FINAL NEGATIVE REPORT   Report Status PENDING   Incomplete   MRSA PCR SCREENING  Status: Normal   Collection Time   10/03/11  4:11 AM      Component  Value Range Status Comment   MRSA by PCR NEGATIVE  NEGATIVE Final      Labs: Basic Metabolic Panel:  Lab 10/06/11 1610 10/05/11 0423 10/03/11 1535 10/03/11 0940 10/03/11 0050  NA 136 144 139 140 134*  K 4.7 4.5 3.4* 3.6 2.7*  CL 106 115* 104 104 96  CO2 20 22 22 20 26   GLUCOSE 221* 103* 309* 270* 291*  BUN 8 8 8 7 11   CREATININE 0.50 0.50 0.56 0.54 0.64  CALCIUM 9.1 9.1 9.0 9.1 9.3  MG -- -- -- 1.8 --  PHOS -- -- -- -- --   Liver Function Tests:  Lab 10/03/11 0940 10/03/11 0050  AST 29 29  ALT 11 11  ALKPHOS 83 93  BILITOT 0.7 0.8  PROT 6.3 6.8  ALBUMIN 2.7* 3.2*   No results found for this basename: LIPASE:5,AMYLASE:5 in the last 168 hours No results found for this basename: AMMONIA:5 in the last 168 hours CBC:  Lab 10/03/11 0940 10/03/11 0050  WBC 12.1* 11.7*  NEUTROABS 9.8* 10.8*  HGB 12.0 12.1  HCT 35.2* 35.7*  MCV 85.6 85.0  PLT 218 258   Cardiac Enzymes:  Lab 10/03/11 1535 10/03/11 0940 10/03/11 0459  CKTOTAL 1102* 1348* 1622*  CKMB 3.2 3.6 3.6  CKMBINDEX -- -- --  TROPONINI <0.30 <0.30 <0.30   BNP: BNP (last 3 results) No results found for this basename: PROBNP:3 in the last 8760 hours CBG:  Lab 10/06/11 0812 10/05/11 2108 10/05/11 1742 10/05/11 1205 10/05/11 0738  GLUCAP 243* 306* 279* 234* 118*    Time coordinating discharge: 40 minutes  Signed:  Cyrene Gharibian A  [facility] 10/06/2011, 12:11 PM

## 2011-10-09 LAB — CULTURE, BLOOD (ROUTINE X 2)

## 2012-01-24 ENCOUNTER — Encounter (HOSPITAL_BASED_OUTPATIENT_CLINIC_OR_DEPARTMENT_OTHER): Payer: Medicare Other | Attending: General Surgery

## 2012-01-24 DIAGNOSIS — F0390 Unspecified dementia without behavioral disturbance: Secondary | ICD-10-CM | POA: Insufficient documentation

## 2012-01-24 DIAGNOSIS — I10 Essential (primary) hypertension: Secondary | ICD-10-CM | POA: Insufficient documentation

## 2012-01-24 DIAGNOSIS — Z794 Long term (current) use of insulin: Secondary | ICD-10-CM | POA: Insufficient documentation

## 2012-01-24 DIAGNOSIS — L988 Other specified disorders of the skin and subcutaneous tissue: Secondary | ICD-10-CM | POA: Insufficient documentation

## 2012-01-24 DIAGNOSIS — E119 Type 2 diabetes mellitus without complications: Secondary | ICD-10-CM | POA: Insufficient documentation

## 2012-01-24 DIAGNOSIS — E876 Hypokalemia: Secondary | ICD-10-CM | POA: Insufficient documentation

## 2012-01-24 DIAGNOSIS — Z79899 Other long term (current) drug therapy: Secondary | ICD-10-CM | POA: Insufficient documentation

## 2012-01-24 DIAGNOSIS — R1310 Dysphagia, unspecified: Secondary | ICD-10-CM | POA: Insufficient documentation

## 2012-01-24 LAB — GLUCOSE, CAPILLARY: Glucose-Capillary: 239 mg/dL — ABNORMAL HIGH (ref 70–99)

## 2012-01-25 NOTE — H&P (Signed)
NAME:  Kristi Snow, MAHARAJ NO.:  [id number]  MEDICAL RECORD NO.:  [id number]  LOCATION:  FOOT                         FACILITY:  [facility]  PHYSICIAN:  Joanne Gavel, M.D.        DATE OF BIRTH:  [birth year]  DATE OF ADMISSION:  01/24/2012 DATE OF DISCHARGE:                             HISTORY & PHYSICAL   CHIEF COMPLAINT:  Abnormal heels.  HISTORY OF PRESENT ILLNESS:  This is an 78 year old demented nursing home patient.  Someone noticed some black spots on her heels.  During the time before she was able to get into the clinic, these black spots disappeared.  The patient now has no wounds of any sort.  PAST MEDICAL HISTORY:  Significant for hypertension, dementia, dysphagia, hypopotassemia, and type 2 diabetes.  SOCIAL HISTORY:  Cigarettes none.  Alcohol, none.  MEDICATIONS:  Cilostazol, Benadryl, donepezil, hydralazine, isosorbide, insulin, Norvasc, NovoLog, acetaminophen p.r.n.  REVIEW OF SYSTEMS:  As above.  PHYSICAL EXAMINATION:  Temperature 98, pulse 90, respirations 16, blood pressure 117/68.  Glucose is 239.  Chest and heart are negative.  She has a few large sebaceous cysts in front of the sternum.  The patient has thin lower extremities with no open palpable pulses and no wounds.  IMPRESSION:  No wounds at present time.  RECOMMENDATIONS:  Float heels.  The patient should have a special mattress since she is bed ridden.  We will see her p.r.n.     Joanne Gavel, M.D.     RA/MEDQ  D:  01/24/2012  T:  01/25/2012  Job:  [id number]

## 2012-02-21 ENCOUNTER — Observation Stay (HOSPITAL_COMMUNITY)
Admission: EM | Admit: 2012-02-21 | Discharge: 2012-02-23 | Disposition: A | Discharge status: Skilled Nursing Facility | Payer: Medicare Other | Attending: Internal Medicine | Admitting: Internal Medicine

## 2012-02-21 ENCOUNTER — Encounter (HOSPITAL_COMMUNITY): Payer: Self-pay | Admitting: General Practice

## 2012-02-21 DIAGNOSIS — N390 Urinary tract infection, site not specified: Secondary | ICD-10-CM | POA: Insufficient documentation

## 2012-02-21 DIAGNOSIS — IMO0002 Reserved for concepts with insufficient information to code with codable children: Principal | ICD-10-CM | POA: Insufficient documentation

## 2012-02-21 DIAGNOSIS — I10 Essential (primary) hypertension: Secondary | ICD-10-CM

## 2012-02-21 DIAGNOSIS — E11649 Type 2 diabetes mellitus with hypoglycemia without coma: Secondary | ICD-10-CM

## 2012-02-21 DIAGNOSIS — N814 Uterovaginal prolapse, unspecified: Secondary | ICD-10-CM

## 2012-02-21 DIAGNOSIS — R509 Fever, unspecified: Secondary | ICD-10-CM

## 2012-02-21 DIAGNOSIS — E1165 Type 2 diabetes mellitus with hyperglycemia: Secondary | ICD-10-CM

## 2012-02-21 DIAGNOSIS — I739 Peripheral vascular disease, unspecified: Secondary | ICD-10-CM | POA: Insufficient documentation

## 2012-02-21 DIAGNOSIS — F0390 Unspecified dementia without behavioral disturbance: Secondary | ICD-10-CM | POA: Insufficient documentation

## 2012-02-21 DIAGNOSIS — G9389 Other specified disorders of brain: Secondary | ICD-10-CM

## 2012-02-21 DIAGNOSIS — M549 Dorsalgia, unspecified: Secondary | ICD-10-CM

## 2012-02-21 DIAGNOSIS — R569 Unspecified convulsions: Secondary | ICD-10-CM

## 2012-02-21 DIAGNOSIS — M48061 Spinal stenosis, lumbar region without neurogenic claudication: Secondary | ICD-10-CM

## 2012-02-21 DIAGNOSIS — E46 Unspecified protein-calorie malnutrition: Secondary | ICD-10-CM | POA: Insufficient documentation

## 2012-02-21 DIAGNOSIS — E876 Hypokalemia: Secondary | ICD-10-CM

## 2012-02-21 DIAGNOSIS — E1169 Type 2 diabetes mellitus with other specified complication: Secondary | ICD-10-CM | POA: Insufficient documentation

## 2012-02-21 DIAGNOSIS — R4182 Altered mental status, unspecified: Secondary | ICD-10-CM

## 2012-02-21 DIAGNOSIS — R001 Bradycardia, unspecified: Secondary | ICD-10-CM

## 2012-02-21 DIAGNOSIS — M119 Crystal arthropathy, unspecified: Secondary | ICD-10-CM

## 2012-02-21 DIAGNOSIS — E860 Dehydration: Secondary | ICD-10-CM | POA: Insufficient documentation

## 2012-02-21 DIAGNOSIS — E870 Hyperosmolality and hypernatremia: Secondary | ICD-10-CM

## 2012-02-21 DIAGNOSIS — E162 Hypoglycemia, unspecified: Secondary | ICD-10-CM

## 2012-02-21 LAB — GLUCOSE, CAPILLARY
Glucose-Capillary: 39 mg/dL — CL (ref 70–99)
Glucose-Capillary: 62 mg/dL — ABNORMAL LOW (ref 70–99)
Glucose-Capillary: 76 mg/dL (ref 70–99)
Glucose-Capillary: 126 mg/dL — ABNORMAL HIGH (ref 70–99)

## 2012-02-21 LAB — CBC WITH DIFFERENTIAL/PLATELET
Basophils Absolute: 0 10*3/uL (ref 0.0–0.1)
Eosinophils Absolute: 0 10*3/uL (ref 0.0–0.7)
Eosinophils Relative: 0 % (ref 0–5)
Lymphs Abs: 0.9 10*3/uL (ref 0.7–4.0)
MCH: 30.7 pg (ref 26.0–34.0)
MCV: 87.1 fL (ref 78.0–100.0)
Neutrophils Relative %: 81 % — ABNORMAL HIGH (ref 43–77)
Platelets: 232 10*3/uL (ref 150–400)
RBC: 4.59 MIL/uL (ref 3.87–5.11)
RDW: 14.3 % (ref 11.5–15.5)
WBC: 7.4 10*3/uL (ref 4.0–10.5)

## 2012-02-21 LAB — URINALYSIS, ROUTINE W REFLEX MICROSCOPIC
Glucose, UA: 100 mg/dL — AB
Nitrite: NEGATIVE
Specific Gravity, Urine: <1.005 — ABNORMAL LOW (ref 1.005–1.030)
pH: 6 (ref 5.0–8.0)

## 2012-02-21 LAB — BASIC METABOLIC PANEL
CO2: 21 mEq/L (ref 19–32)
Calcium: 9.4 mg/dL (ref 8.4–10.5)
Creatinine, Ser: 0.46 mg/dL — ABNORMAL LOW (ref 0.50–1.10)
GFR calc non Af Amer: 88 mL/min — ABNORMAL LOW (ref >90)
Sodium: 140 mEq/L (ref 135–145)

## 2012-02-21 LAB — URINE MICROSCOPIC-ADD ON

## 2012-02-21 MED ORDER — MEGESTROL ACETATE 40 MG/ML PO SUSP
200.0000 mg | Freq: Every day | ORAL | Status: DC
  Administered 2012-02-22: 200 mg via ORAL
  Filled 2012-02-21 (×2): qty 5

## 2012-02-21 MED ORDER — CILOSTAZOL 100 MG PO TABS
100.0000 mg | ORAL_TABLET | Freq: Two times a day (BID) | ORAL | Status: DC
  Administered 2012-02-21 – 2012-02-22 (×3): 100 mg via ORAL
  Filled 2012-02-21 (×5): qty 1

## 2012-02-21 MED ORDER — ADULT MULTIVITAMIN W/MINERALS CH
1.0000 | ORAL_TABLET | Freq: Every day | ORAL | Status: DC
  Administered 2012-02-22: 1 via ORAL
  Filled 2012-02-21 (×2): qty 1

## 2012-02-21 MED ORDER — ACETAMINOPHEN 325 MG PO TABS
650.0000 mg | ORAL_TABLET | Freq: Four times a day (QID) | ORAL | Status: DC | PRN
  Administered 2012-02-23: 650 mg via ORAL
  Filled 2012-02-21: qty 2

## 2012-02-21 MED ORDER — DEXTROSE 5 % IV SOLN: INTRAVENOUS | Status: AC

## 2012-02-21 MED ORDER — CHLORHEXIDINE GLUCONATE CLOTH 2 % EX PADS
6.0000 | MEDICATED_PAD | Freq: Every day | CUTANEOUS | Status: DC
  Administered 2012-02-22 – 2012-02-23 (×2): 6 via TOPICAL

## 2012-02-21 MED ORDER — DEXTROSE 5 % IV SOLN
1.0000 g | INTRAVENOUS | Status: DC
  Administered 2012-02-21 – 2012-02-22 (×2): 1 g via INTRAVENOUS
  Filled 2012-02-21 (×2): qty 10

## 2012-02-21 MED ORDER — ONDANSETRON HCL 4 MG PO TABS: 4.0000 mg | ORAL_TABLET | Freq: Four times a day (QID) | ORAL | Status: DC | PRN

## 2012-02-21 MED ORDER — ONDANSETRON HCL 4 MG/2ML IJ SOLN: 4.0000 mg | Freq: Four times a day (QID) | INTRAMUSCULAR | Status: DC | PRN

## 2012-02-21 MED ORDER — ACETAMINOPHEN 650 MG RE SUPP: 650.0000 mg | Freq: Four times a day (QID) | RECTAL | Status: DC | PRN

## 2012-02-21 MED ORDER — DONEPEZIL HCL 5 MG PO TABS
5.0000 mg | ORAL_TABLET | Freq: Every day | ORAL | Status: DC
  Administered 2012-02-21 – 2012-02-22 (×2): 5 mg via ORAL
  Filled 2012-02-21 (×3): qty 1

## 2012-02-21 MED ORDER — MUPIROCIN 2 % EX OINT
1.0000 "application " | TOPICAL_OINTMENT | Freq: Two times a day (BID) | CUTANEOUS | Status: DC
  Administered 2012-02-21 – 2012-02-23 (×4): 1 via NASAL
  Filled 2012-02-21: qty 22

## 2012-02-21 MED ORDER — DEXTROSE 50 % IV SOLN
INTRAVENOUS | Status: AC
  Administered 2012-02-21: 50 mL via INTRAVENOUS
  Filled 2012-02-21: qty 50

## 2012-02-21 MED ORDER — DEXTROSE-NACL 5-0.9 % IV SOLN
INTRAVENOUS | Status: DC
  Administered 2012-02-22: 05:00:00 via INTRAVENOUS

## 2012-02-21 MED ORDER — DEXTROSE-NACL 5-0.9 % IV SOLN
Freq: Once | INTRAVENOUS | Status: AC
  Administered 2012-02-21: 15:00:00 via INTRAVENOUS

## 2012-02-21 MED ORDER — ONE-DAILY MULTI VITAMINS PO TABS: 1.0000 | ORAL_TABLET | Freq: Every day | ORAL | Status: DC

## 2012-02-21 MED ORDER — DEXTROSE 50 % IV SOLN
50.0000 mL | Freq: Once | INTRAVENOUS | Status: AC
  Administered 2012-02-21: 50 mL via INTRAVENOUS

## 2012-02-21 NOTE — ED Notes (Signed)
Called [facility] Spoke to [employer]. sts pts baseline is alert- oriented to self and present situation only, pt mainly non-verbal and can "act out". Sts AM sugar was 104 held novolog. 10 units of levemir given prior to breakfast sts pt. Barely ate breakfast. When tech was giving pt. Shower pt wasn't "acting right and coming in and out of consciousness" 1 dose of glucagon given at facility.

## 2012-02-21 NOTE — ED Notes (Signed)
RN and MD made aware of critical CBG of 39

## 2012-02-21 NOTE — ED Notes (Signed)
Per [facility], patient lives at [facility].  Patient was found this morning unresponsive, but they tried to give her a shower to wake her up.   (Per EMS, this is standard for patient).   CBG was 31 there.

## 2012-02-21 NOTE — ED Notes (Signed)
Care transferred and report given to Elizabeth, RN 

## 2012-02-21 NOTE — ED Notes (Signed)
Notified RN of CBG 144

## 2012-02-21 NOTE — ED Notes (Signed)
Updated son Ellyse Rotolo

## 2012-02-21 NOTE — H&P (Signed)
[facility] History and Physical  Kristi Snow ZOX:[id number] DOB: 11/04/1924 DOA: 02/21/2012  Referring physician: Maurine Simmering, ER physician PCP: Alva Garnet., MD  Specialists: None  Chief Complaint: Hypoglycemia  HPI: Kristi Snow is a 79 y.o. female  The past medical history diabetes mellitus, peripheral vascular disease, advanced dementia who was at the nursing facility. Her blood sugars are being checked again she is on be significantly hypoglycemic she was given medicine for this but persistently low. She was sent over to the emergency room she was evaluated and her blood work was essentially unremarkable but she had another hypoglycemic event in the emergency room. As best as she likely need to be monitored overnight in hospitals were called for further evaluation and admission. Requested a UTI to be done and this came back with a very large urinary tract infection.  Review of Systems: Because of patient's advanced dementia, mild to get any kind of review of systems from her. She is nonverbal.  Past Medical History  Diagnosis Date  . Hypertension   . Diabetes mellitus   . Encephalopathy, unspecified   . Urinary tract infection   . Dementia   . Prolapsed uterus   . Lumbar spinal stenosis   . Crystal arthritis 07/09/2011   History reviewed. No pertinent past surgical history. Social History:  reports that she quit smoking about 45 years ago. She has never used smokeless tobacco. She reports that she does not drink alcohol or use illicit drugs. Patient resides in a skilled nursing facility. It is unclear, but I think that she is mostly bed bound  No Known Allergies  Family history: Unable to be obtained from patient because of dementia. Her family to present.)  Prior to Admission medications   Medication Sig Start Date End Date Taking? Authorizing Provider  acetaminophen-codeine (TYLENOL #3) 300-30 MG per tablet Take 1 tablet by mouth every 4 (four) hours as  needed. For severe pain.   Yes Historical Provider, MD  amLODipine (NORVASC) 10 MG tablet Take 1 tablet (10 mg total) by mouth daily. 06/14/11 06/13/12 Yes Leroy Sea, MD  cilostazol (PLETAL) 100 MG tablet Take 100 mg by mouth 2 (two) times daily.     Yes Historical Provider, MD  diphenhydrAMINE (SOMINEX) 25 MG tablet Take 25 mg by mouth 2 (two) times daily.   Yes Historical Provider, MD  donepezil (ARICEPT) 5 MG tablet Take 5 mg by mouth daily.    Yes Historical Provider, MD  hydrALAZINE (APRESOLINE) 10 MG tablet Take 1 tablet (10 mg total) by mouth 3 (three) times daily. 05/27/11 05/26/12 Yes Ripudeep Jenna Luo, MD  insulin aspart (NOVOLOG) 100 UNIT/ML injection Inject 0-10 Units into the skin 4 (four) times daily - after meals and at bedtime. CBG 70-120: 0 u; CBG 121-150: 3 u; CBG 151-200: 4 u; CBG 201-250: 7 u; CBG 251-300: 11 u;CBG 301-350: 15 u; CBG 351-400: 20 u; CBG > 400: call MD 07/09/11  Yes Maryruth Bun Rama, MD  insulin detemir (LEVEMIR) 100 UNIT/ML injection Inject 5 Units into the skin at bedtime.   Yes Historical Provider, MD  isosorbide mononitrate (ISMO,MONOKET) 20 MG tablet Take 20 mg by mouth 2 (two) times daily.    Yes Historical Provider, MD  megestrol (MEGACE) 40 MG/ML suspension Take 200 mg by mouth daily.   Yes Historical Provider, MD  Multiple Vitamin (MULTIVITAMIN) tablet Take 1 tablet by mouth daily.   Yes Historical Provider, MD  vitamin C (ASCORBIC ACID) 500 MG tablet Take 500 mg by  mouth daily.   Yes Historical Provider, MD   Physical Exam: Filed Vitals:   02/21/12 1235 02/21/12 1321 02/21/12 1438 02/21/12 1530  BP:  115/87 143/77 113/57  Pulse:      Temp: 96.1 F (35.6 C)     TempSrc: Oral     Resp:  17 18   SpO2:  99% 99%      General:  Awake, difficult to get how oriented she is. No acute distress  Eyes: Sclera nonicteric, I think extraocular movements appear to be intact  ENT: Normocephalic and atraumatic, mucous members are dry  Neck: Supple, no  JVD  Cardiovascular: Regular rate and rhythm, S1-S2  Respiratory: Clear to auscultation bilaterally  Abdomen: Soft, nontender, nondistended, positive bowel sounds  Skin: No visible skin breaks, tears or lesions  Musculoskeletal: Trace pitting edema  Psychiatric: Patient has end-stage dementia  Neurologic: Because of dementia, difficult to get a neurological exam. No evidence of deficits  Labs on Admission:  Basic Metabolic Panel:  Lab 02/21/12 4098  NA 140  K 3.3*  CL 106  CO2 21  GLUCOSE 68*  BUN 12  CREATININE 0.46*  CALCIUM 9.4  MG --  PHOS --   CBC:  Lab 02/21/12 1213  WBC 7.4  NEUTROABS 6.0  HGB 14.1  HCT 40.0  MCV 87.1  PLT 232   CBG:  Lab 02/21/12 1558 02/21/12 1457 02/21/12 1429 02/21/12 1315 02/21/12 1239  GLUCAP 126* 76 62* 95 39*    Radiological Exams on Admission: No results found.  EKG: Independently reviewed. EKG is read as atrial fibrillation, however I think check she is in normal sinus rhythm physical exam, some visible P waves. EKG is probably skewed because of patient chronic movement  Assessment/Plan Active Problems:  Dementia: Stable. Continue Aricept  Hypertension: Stable. Given signs of dehydration from UTI, holding her antihypertensives  DM (diabetes mellitus), type 2, uncontrolled: Sliding scale only  Dehydration  Hypoglycemia associated with diabetes: Principal problem brought on by urinary tract infection. Treat urinary tract infection and use D5 until sugars stay elevated  UTI (lower urinary tract infection): IV Rocephin, await sensitivities  Protein calorie malnutrition: Continue Megace  PVD (peripheral vascular disease): Continue peripheral vascular disease    Code Status: Patient unable to tell me. Have no records from the nursing facility. Could not to hold her family. Based on previous admissions, she is a full code.  Family Communication: Left message with family.  Disposition Plan: Possibly back to nursing  facility as early as tomorrow  Time spent: 25 minutes  Hollice Espy [facility] Pager [phone]  If 7PM-7AM, please contact night-coverage [email or web address] Password [email or web address] 02/21/2012, 6:20 PM

## 2012-02-21 NOTE — Progress Notes (Signed)
Lauria Depoy [id number] Admission Data: 02/21/2012 6:57 PM Attending Provider: Hollice Espy, MD  WUJ:[email or web address],WGNFAOZH R., MD Consults/ Treatment Team:    Kristi Snow is a 79 y.o. female patient admitted [facility] ED from [facility], alert  To self only. Hx of dementia  Full Code, VSS - Blood pressure 113/57, pulse 95, temperature 96.1 F (35.6 C), temperature source Oral, resp. rate 18, SpO2 99.00%.,  no c/o shortness of breath, no c/o chest pain, no distress noted.  IV site WDL:  antecubital right, condition patent and no redness with a transparent dsg that's clean dry and intact.  Allergies:  No Known Allergies   Past Medical History  Diagnosis Date  . Hypertension   . Diabetes mellitus   . Encephalopathy, unspecified   . Urinary tract infection   . Dementia   . Prolapsed uterus   . Lumbar spinal stenosis   . Crystal arthritis 07/09/2011    History:  obtained from guardian. son   Admission INP armband ID verified and in place. Pt has a healing stage 2 to sacrum and abrasions to the right and left ac and right hip. Pt has a mass on left breast and between breasts. Bed alarm on   Will cont to monitor and assist as needed.  Cindra Eves, RN 02/21/2012 6:57 PM

## 2012-02-21 NOTE — ED Provider Notes (Signed)
History     CSN: [id number]  Arrival date & time 02/21/12  1106   First MD Initiated Contact with Patient 02/21/12 1123      Chief Complaint  Patient presents with  . Hypoglycemia     Patient is a 79 y.o. female presenting with altered mental status. The history is provided by the EMS personnel. The history is limited by the condition of the patient.  Altered Mental Status This is a new problem. Episode onset: unknown time ago. The problem occurs constantly. The problem has not changed since onset.Nothing aggravates the symptoms. Relieved by: glucose. Treatments tried: glucagon. The treatment provided mild relief.  pt presents from nursing facility She has h/o dementia at baseline.  Apparently this morning pt seemed more confused and glucose obtained showed CBG 31 She was given glucagon She has h/o diabetes.  Apparently she did not receive morning novolog but did get morning levemir Pt is unable to give any history  Past Medical History  Diagnosis Date  . Hypertension   . Diabetes mellitus   . Encephalopathy, unspecified   . Urinary tract infection   . Dementia   . Prolapsed uterus   . Lumbar spinal stenosis   . Crystal arthritis 07/09/2011    Past Surgical History  Procedure Date  . No past surgeries     Family History  Problem Relation Age of Onset  . Family history unknown: Yes    History  Substance Use Topics  . Smoking status: Never Smoker   . Smokeless tobacco: Never Used  . Alcohol Use: No    OB History    Grav Para Term Preterm Abortions TAB SAB Ect Mult Living                  Review of Systems  Unable to perform ROS: Dementia  Psychiatric/Behavioral: Positive for altered mental status.    Allergies  Review of patient's allergies indicates no known allergies.  Home Medications   Current Outpatient Rx  Name  Route  Sig  Dispense  Refill  . ACETAMINOPHEN-CODEINE #3 300-30 MG PO TABS   Oral   Take 1 tablet by mouth every 4 (four) hours as  needed. For severe pain.         Marland Kitchen AMLODIPINE BESYLATE 10 MG PO TABS   Oral   Take 1 tablet (10 mg total) by mouth daily.         Marland Kitchen CILOSTAZOL 100 MG PO TABS   Oral   Take 100 mg by mouth 2 (two) times daily.           Marland Kitchen DIPHENHYDRAMINE HCL (SLEEP) 25 MG PO TABS   Oral   Take 25 mg by mouth 2 (two) times daily.         . DONEPEZIL HCL 5 MG PO TABS   Oral   Take 5 mg by mouth daily.          Marland Kitchen HYDRALAZINE HCL 10 MG PO TABS   Oral   Take 1 tablet (10 mg total) by mouth 3 (three) times daily.         . INSULIN ASPART 100 UNIT/ML [place] SOLN   Subcutaneous   Inject 0-10 Units into the skin 4 (four) times daily - after meals and at bedtime. CBG 70-120: 0 u; CBG 121-150: 3 u; CBG 151-200: 4 u; CBG 201-250: 7 u; CBG 251-300: 11 u;CBG 301-350: 15 u; CBG 351-400: 20 u; CBG > 400: call MD   1 vial      .  INSULIN DETEMIR 100 UNIT/ML [place] SOLN   Subcutaneous   Inject 5 Units into the skin at bedtime.         . ISOSORBIDE MONONITRATE 20 MG PO TABS   Oral   Take 20 mg by mouth 2 (two) times daily.          . MEGESTROL ACETATE 40 MG/ML PO SUSP   Oral   Take 200 mg by mouth daily.         Marland Kitchen ONE-DAILY MULTI VITAMINS PO TABS   Oral   Take 1 tablet by mouth daily.         Marland Kitchen VITAMIN C 500 MG PO TABS   Oral   Take 500 mg by mouth daily.           BP 130/67  Pulse 95  Temp 96.8 F (36 C) (Oral)  Resp 22  SpO2 87% BP 115/87  Pulse 95  Temp 96.1 F (35.6 C) (Oral)  Resp 17  SpO2 99%   Physical Exam CONSTITUTIONAL: Well developed/well nourished Pt is grinding her teeth constantly (baseline per nursing facility) HEAD AND FACE: Normocephalic/atraumatic EYES: EOMI ENMT: Mucous membranes dry NECK: supple no meningeal signs SPINE:entire spine nontender CV: S1/S2 noted, no murmurs/rubs/gallops noted LUNGS: Lungs are clear to auscultation bilaterally, no apparent distress ABDOMEN: soft, nontender, no rebound or guarding NEURO: Pt is awake/alert, she will  follow some commands but is mostly nonverbal.  She moves all extremities EXTREMITIES: pulses normal, full ROM, no deformity SKIN: warm, color normal   ED Course  Procedures    Labs Reviewed  BASIC METABOLIC PANEL  CBC WITH DIFFERENTIAL   Pt with another drop in glucose.  Given she has dementia and difficult to communicate, will need admission and IV glucose and monitoring.  D/w dr Rito Ehrlich to admit to medical service      MDM  Nursing notes including past medical history and social history reviewed and considered in documentation Labs/vital reviewed and considered        Date: 02/21/2012  Rate: 90  Rhythm: normal sinus rhythm  QRS Axis: normal  Intervals: normal  ST/T Wave abnormalities: nonspecific ST changes  Conduction Disutrbances:none  Narrative Interpretation:   Old EKG Reviewed: unchanged    Joya Gaskins, MD 02/21/12 1346

## 2012-02-21 NOTE — ED Notes (Signed)
EKG handed to Dr Wickline 

## 2012-02-21 NOTE — ED Notes (Signed)
(  Son) Kearston Putman contact number [phone]

## 2012-02-21 NOTE — ED Notes (Signed)
OJ and saltine crackers given

## 2012-02-21 NOTE — ED Notes (Signed)
CBG taken at 1240 by Jess EMT reported to RN. MD notified. Pt alert and oriented at baseline. D50 ordered and given. Pt IV via ems disconnected due to movement and new IV inserted. Pt CBG re-checked at 1315 @ 96.

## 2012-02-21 NOTE — ED Notes (Addendum)
Drink given 

## 2012-02-22 DIAGNOSIS — I10 Essential (primary) hypertension: Secondary | ICD-10-CM

## 2012-02-22 DIAGNOSIS — R4182 Altered mental status, unspecified: Secondary | ICD-10-CM

## 2012-02-22 LAB — CBC
HCT: 37.1 % (ref 36.0–46.0)
Hemoglobin: 12.9 g/dL (ref 12.0–15.0)
MCHC: 34.8 g/dL (ref 30.0–36.0)
MCV: 87.1 fL (ref 78.0–100.0)
RDW: 14.5 % (ref 11.5–15.5)

## 2012-02-22 LAB — BASIC METABOLIC PANEL
BUN: 7 mg/dL (ref 6–23)
CO2: 21 mEq/L (ref 19–32)
Chloride: 105 mEq/L (ref 96–112)
Creatinine, Ser: 0.46 mg/dL — ABNORMAL LOW (ref 0.50–1.10)
GFR calc Af Amer: >90 mL/min (ref >90)
Potassium: 3.7 mEq/L (ref 3.5–5.1)

## 2012-02-22 LAB — URINE CULTURE

## 2012-02-22 LAB — GLUCOSE, CAPILLARY: Glucose-Capillary: 161 mg/dL — ABNORMAL HIGH (ref 70–99)

## 2012-02-22 LAB — HEMOGLOBIN A1C
Hgb A1c MFr Bld: 6.9 % — ABNORMAL HIGH (ref <5.7)
Mean Plasma Glucose: 151 mg/dL — ABNORMAL HIGH (ref <117)

## 2012-02-22 MED ORDER — INSULIN ASPART 100 UNIT/ML ~~LOC~~ SOLN
0.0000 [IU] | Freq: Three times a day (TID) | SUBCUTANEOUS | Status: DC
  Administered 2012-02-22 (×2): 3 [IU] via SUBCUTANEOUS
  Administered 2012-02-23: 1 [IU] via SUBCUTANEOUS
  Administered 2012-02-23: 2 [IU] via SUBCUTANEOUS

## 2012-02-22 MED ORDER — POTASSIUM CHLORIDE CRYS ER 20 MEQ PO TBCR
40.0000 meq | EXTENDED_RELEASE_TABLET | Freq: Once | ORAL | Status: AC
  Administered 2012-02-22: 40 meq via ORAL
  Filled 2012-02-22: qty 2

## 2012-02-22 MED ORDER — PRO-STAT SUGAR FREE PO LIQD
30.0000 mL | Freq: Three times a day (TID) | ORAL | Status: DC
  Administered 2012-02-22 – 2012-02-23 (×2): 30 mL via ORAL
  Filled 2012-02-22 (×8): qty 30

## 2012-02-22 MED ORDER — HYDRALAZINE HCL 10 MG PO TABS
10.0000 mg | ORAL_TABLET | Freq: Three times a day (TID) | ORAL | Status: DC
  Administered 2012-02-22 – 2012-02-23 (×5): 10 mg via ORAL
  Filled 2012-02-22 (×7): qty 1

## 2012-02-22 NOTE — Clinical Social Work Psychosocial (Signed)
     Clinical Social Work Department BRIEF PSYCHOSOCIAL ASSESSMENT 02/22/2012  Patient:  Kristi Snow, Kristi Snow     Account Number:  [id number]     Admit date:  02/21/2012  Clinical Social Worker:  Robin Searing  Date/Time:  02/22/2012 12:16 PM  Referred by:  Physician  Date Referred:  02/22/2012 Referred for  SNF Placement   Other Referral:   Interview type:  Family Other interview type:   SonKathlene November [phone]    PSYCHOSOCIAL DATA Living Status:  FACILITY Admitted from facility:  [facility] Level of care:  Skilled Nursing Facility Primary support name:  son Primary support relationship to patient:  FAMILY Degree of support available:   good    CURRENT CONCERNS Current Concerns  Post-Acute Placement   Other Concerns:    SOCIAL WORK ASSESSMENT / PLAN Patient admitted from SNF bed at [facility]- has been there since April 2013 per son. Plan is for return at d.c and this has been confirmed with SNF as well.   Assessment/plan status:  Other - See comment Other assessment/ plan:   FL2 completion   Information/referral to community resources:   SNF    PATIENTS/FAMILYS RESPONSE TO PLAN OF CARE: SNF return at d/c-  possibly tomorrow per MD

## 2012-02-22 NOTE — Progress Notes (Signed)
INITIAL NUTRITION ASSESSMENT  DOCUMENTATION CODES Per approved criteria  -Not Applicable   INTERVENTION: 1. 30 ml Pro-stat TID, each supplement provides 100 kcal and 15 gm protein  2. RD will continue to follow    NUTRITION DIAGNOSIS: Unintentional weight loss related to unknown etiology as evidenced by weight hx .   Goal: PO intake to meet >/=90% estimated nutrition needs   Monitor:  PO intake, weight trends, I/O's, labs   Reason for Assessment: Low Braden Score  79 y.o. female  Admitting Dx: Hypoglycemia associated with diabetes  ASSESSMENT: Pt admitted with episodes of hypoglycemia. Pt with dementia resides at a SNF.  Per weight hx, pt with 21 lbs (14% body weight loss) in the past 8 months.  Per SF orders pt with Med Pass 2.0, 120 ml BID and 30 ml Pro-stat BID. This provides 680 kcal, 30 gm protein daily.  No oral nutrition supplements ordered for pt at this time. This facility does not carry Med Pass.    Height: Ht Readings from Last 1 Encounters:  02/22/12 5\' 8"  (1.727 m)    Weight: Wt Readings from Last 1 Encounters:  02/22/12 132 lb 15 oz (60.3 kg)    Ideal Body Weight: 140 lbs (63.6 kg)  % Ideal Body Weight: 94%  Wt Readings from Last 10 Encounters:  02/22/12 132 lb 15 oz (60.3 kg)  10/06/11 132 lb 11.5 oz (60.2 kg)  07/07/11 153 lb (69.4 kg)  06/14/11 153 lb (69.4 kg)  05/26/11 147 lb 4.3 oz (66.8 kg)  03/09/11 151 lb 3.8 oz (68.6 kg)  11/08/10 165 lb (74.844 kg)    Usual Body Weight: 153 lbs   % Usual Body Weight: 86%  BMI:  Body mass index is 20.21 kg/(m^2). WNL  Estimated Nutritional Needs: Kcal: 1350-1550 Protein: 65-75 gm  Fluid: 1.4-1.6 L/day  Skin: Stage II to sacrum   Diet Order: Carb Control  EDUCATION NEEDS: -No education needs identified at this time   Intake/Output Summary (Last 24 hours) at 02/22/12 1427 Last data filed at 02/22/12 0900  Gross per 24 hour  Intake    290 ml  Output      0 ml  Net    290 ml     Last BM: 1/8   Labs:   Lab 02/22/12 0658 02/21/12 1213  NA 141 140  K 3.7 3.3*  CL 105 106  CO2 21 21  BUN 7 12  CREATININE 0.46* 0.46*  CALCIUM 9.4 9.4  MG -- --  PHOS -- --  GLUCOSE 164* 68*    CBG (last 3)   Basename 02/22/12 1219 02/22/12 0802 02/21/12 1558  GLUCAP 221* 161* 126*    Scheduled Meds:   . cefTRIAXone (ROCEPHIN)  IV  1 g Intravenous Q24H  . Chlorhexidine Gluconate Cloth  6 each Topical Q0600  . cilostazol  100 mg Oral BID  . dextrose   Intravenous STAT  . donepezil  5 mg Oral Daily  . hydrALAZINE  10 mg Oral Q8H  . insulin aspart  0-9 Units Subcutaneous TID WC  . megestrol  200 mg Oral Daily  . multivitamin with minerals  1 tablet Oral Daily  . mupirocin ointment  1 application Nasal BID    Continuous Infusions:   . dextrose 5 % and 0.9% NaCl 50 mL/hr at 02/22/12 9629    Past Medical History  Diagnosis Date  . Hypertension   . Diabetes mellitus   . Encephalopathy, unspecified   . Urinary tract infection   .  Dementia   . Prolapsed uterus   . Lumbar spinal stenosis   . Crystal arthritis 07/09/2011    History reviewed. No pertinent past surgical history.  Clarene Duke RD, LDN Pager [phone] After Hours pager [phone]

## 2012-02-22 NOTE — Progress Notes (Signed)
[facility] PROGRESS NOTE  Kristi Snow GNF:[id number] DOB: [birth year] DOA: 02/21/2012 PCP: Alva Garnet., MD  Assessment/Plan: 1.  *Hypoglycemia associated with diabetes: improving this am. CBG 161. Will decrease rate of D5W as pt taking po's when offered. Started SSI glycemic control. Will check A1c. Of note pt takes 5 units levemir qhs at snf.  2.  Dementia: appears at baseline. Attempts to follow commands. Mostly non-verbal 3.  Hypertension: poor control. Home meds norvasc and hydralazine on hold at admission. Will resume hydralazine as pt somewhat tachycardic as well. Will monitor closely 4. UTI: rocephin day #2. Culture pending. Afebrile, non-toxic appearing 5.  DM (diabetes mellitus), type 2, uncontrolled. See #1 6.  Dehydration: continue IV fluids gently.  7. Protein calorie malnutrition: megace full feeds   Code Status: presumably full Family Communication: updated son micheal by phone. Disposition Plan: back to facility when stable, hopefully 24 hours   Consultants:  none  Procedures:  none  Antibiotics:  Rocephin 02/21/12 >>>  HPI/Subjective: Awake alert makes eye contact, attempts to follow commands. Mostly non-verbal, struggles to make needs known  Objective: Filed Vitals:   02/21/12 1530 02/21/12 1900 02/22/12 0502 02/22/12 0525  BP: 113/57 158/89  152/92  Pulse:  86    Temp:  98.4 F (36.9 C)  98.4 F (36.9 C)  TempSrc:  Axillary  Axillary  Resp:  18  18  Height:   5\' 8"  (1.727 m)   Weight:   60.3 kg (132 lb 15 oz)   SpO2:  100%  93%    Intake/Output Summary (Last 24 hours) at 02/22/12 0806 Last data filed at 02/22/12 0500  Gross per 24 hour  Intake     50 ml  Output      0 ml  Net     50 ml   Filed Weights   02/22/12 0502  Weight: 60.3 kg (132 lb 15 oz)    Exam:   General:  Awake alert NAD  Cardiovascular: tachycardic regular No MGR No LEE   Respiratory: normal effort somewhat shallow BSCTAB  Abdomen: soft +BS non-tender to  palpation  Data Reviewed: Basic Metabolic Panel:  Lab 02/21/12 8469  NA 140  K 3.3*  CL 106  CO2 21  GLUCOSE 68*  BUN 12  CREATININE 0.46*  CALCIUM 9.4  MG --  PHOS --   Liver Function Tests: No results found for this basename: AST:5,ALT:5,ALKPHOS:5,BILITOT:5,PROT:5,ALBUMIN:5 in the last 168 hours No results found for this basename: LIPASE:5,AMYLASE:5 in the last 168 hours No results found for this basename: AMMONIA:5 in the last 168 hours CBC:  Lab 02/22/12 0658 02/21/12 1213  WBC 4.9 7.4  NEUTROABS -- 6.0  HGB 12.9 14.1  HCT 37.1 40.0  MCV 87.1 87.1  PLT 246 232   Cardiac Enzymes: No results found for this basename: CKTOTAL:5,CKMB:5,CKMBINDEX:5,TROPONINI:5 in the last 168 hours BNP (last 3 results) No results found for this basename: PROBNP:3 in the last 8760 hours CBG:  Lab 02/21/12 1558 02/21/12 1457 02/21/12 1429 02/21/12 1315 02/21/12 1239  GLUCAP 126* 76 62* 95 39*    Recent Results (from the past 240 hour(s))  MRSA PCR SCREENING     Status: Abnormal   Collection Time   02/21/12  5:26 PM      Component Value Range Status Comment   MRSA by PCR POSITIVE (*) NEGATIVE Final      Studies: No results found.  Scheduled Meds:    . cefTRIAXone (ROCEPHIN)  IV  1 g Intravenous Q24H  .  Chlorhexidine Gluconate Cloth  6 each Topical Q0600  . cilostazol  100 mg Oral BID  . dextrose   Intravenous STAT  . donepezil  5 mg Oral Daily  . hydrALAZINE  10 mg Oral Q8H  . insulin aspart  0-9 Units Subcutaneous TID WC  . megestrol  200 mg Oral Daily  . multivitamin with minerals  1 tablet Oral Daily  . mupirocin ointment  1 application Nasal BID  . potassium chloride  40 mEq Oral Once   Continuous Infusions:    . dextrose 5 % and 0.9% NaCl 50 mL/hr at 02/22/12 0523      Time spent: 35  minutes    [facility] M  [facility]  If 8PM-8AM, please contact night-coverage at [email or web address], password [facility] 02/22/2012, 8:06 AM  LOS: 1 day

## 2012-02-22 NOTE — Progress Notes (Signed)
Addendum  Patient seen and examined, chart and data base reviewed.  I agree with the above assessment and plan.  For full details please see Mrs. Toya Smothers NP note.  Hypoglycemia results, patient still on D5/normal saline at 50, I will discontinue IV fluids, lites CBG is 221.  UTI, on Rocephin. Antibiotics will be adjusted according to the culture results.   Clint Lipps, MD [facility] Pager: [phone] 02/22/2012, 3:42 PM

## 2012-02-23 DIAGNOSIS — M549 Dorsalgia, unspecified: Secondary | ICD-10-CM

## 2012-02-23 DIAGNOSIS — A498 Other bacterial infections of unspecified site: Secondary | ICD-10-CM

## 2012-02-23 LAB — BASIC METABOLIC PANEL
CO2: 22 mEq/L (ref 19–32)
Calcium: 9.1 mg/dL (ref 8.4–10.5)
Creatinine, Ser: 0.46 mg/dL — ABNORMAL LOW (ref 0.50–1.10)
Glucose, Bld: 170 mg/dL — ABNORMAL HIGH (ref 70–99)

## 2012-02-23 LAB — GLUCOSE, CAPILLARY
Glucose-Capillary: 147 mg/dL — ABNORMAL HIGH (ref 70–99)
Glucose-Capillary: 170 mg/dL — ABNORMAL HIGH (ref 70–99)
Glucose-Capillary: 99 mg/dL (ref 70–99)

## 2012-02-23 MED ORDER — AMLODIPINE BESYLATE 10 MG PO TABS
10.0000 mg | ORAL_TABLET | Freq: Every day | ORAL | Status: DC
  Administered 2012-02-23: 10 mg via ORAL
  Filled 2012-02-23: qty 1

## 2012-02-23 MED ORDER — PRO-STAT SUGAR FREE PO LIQD: 30.0000 mL | Freq: Three times a day (TID) | ORAL | Status: DC

## 2012-02-23 MED ORDER — FLUCONAZOLE 100 MG PO TABS: 100.0000 mg | ORAL_TABLET | Freq: Every day | ORAL | Status: DC

## 2012-02-23 MED ORDER — FLUCONAZOLE 100 MG PO TABS
100.0000 mg | ORAL_TABLET | Freq: Every day | ORAL | Status: DC
  Filled 2012-02-23: qty 1

## 2012-02-23 NOTE — Progress Notes (Signed)
Patient for d/c today to return to SNF bed at [facility]. Patient's son agreeable to this plan- plan transfer via EMS. Reece Levy, MSW, Theresia Majors [phone]

## 2012-02-23 NOTE — Discharge Summary (Signed)
Physician Discharge Summary  Kristi Snow EAV:[id number] DOB: October 09, 1924 DOA: 02/21/2012  PCP: Alva Garnet., MD  Admit date: 02/21/2012 Discharge date: 02/23/2012  Time spent: 40 minutes  Recommendations for Outpatient Follow-up:  1. Discharge to facility 2. Follow up with PCP 1 wee 3. Recommend offering po fluids every 2 hours while awak  Discharge Diagnoses:  Principal Problem:  *Hypoglycemia associated with diabetes Active Problems:  Dementia  Hypertension  DM (diabetes mellitus), type 2, uncontrolled  Dehydration  UTI (lower urinary tract infection)  Protein calorie malnutrition  PVD (peripheral vascular disease)   Discharge Condition: stabel  Diet recommendation: carb modified  Filed Weights   02/22/12 0502  Weight: 60.3 kg (132 lb 15 oz)    History of present illness:  Kristi Snow is a 79 y.o. female with past medical history diabetes mellitus, peripheral vascular disease, advanced dementia who presented to ED on 02/21/12 from the nursing facility with cc hypoglycemia. Reportedly sugar low recently and she was treated but hypoglycemia persisted.  She was sent over to the emergency room she was evaluated and her blood work was essentially unremarkable but she had another hypoglycemic event in the emergency room. Work up in the ED also yielded  came back with a very large urinary tract infection. She was admitted for further treatment   Hospital Course:  1. Hypoglycemia associated with diabetes: and likely related to UTI. D5W discontinued 02/22/12. SSI glycemic control provided. Pt will take po's when offered. A1c 6.9. At time of discharge CBG range 136-147. Will discharge on sliding scale glycemic control. Pt on levimir 5unit pre admission. This will be discontinued at discharge 2. Dementia:  Initially pt restless, fidgety. Continued home meds, IV fluids. At discharge pt very close to baseline. Discussed at length with son at bedside the course of dementia. He verbalized  under standing.  3. Hypertension: poor control initiallyl. Home meds norvasc and hydralazine on hold at admission. BP trended upward. Hydralazine resumed 02/22/12. Norvasc resumed 02/23/12. At discharge SBP range 150-160. 4. UTI: pt received rocephin for 2 days. Cultures came back with yeast. Rocephin discontinued and diflucan started. Will be discharged with diflucan for 9 more days. Afebrile, non-toxic appearing at discharge 5. DM (diabetes mellitus), type 2, uncontrolled. See #1. At discharge levemir discontinued 6. Dehydration: provided with IV fluids. Pt will take po fluids when offered. Recommend offering fluids to pt every 2 hours while awake  7. Protein calorie malnutrition: megace full feeds     Procedures:  none  Consultations:  none  Discharge Exam: Filed Vitals:   02/22/12 0525 02/22/12 1224 02/22/12 2052 02/23/12 0536  BP: 152/92  150/100 169/75  Pulse:   94 86  Temp: 98.4 F (36.9 C) 98.6 F (37 C) 98.3 F (36.8 C) 97.5 F (36.4 C)  TempSrc: Axillary Rectal Axillary Axillary  Resp: 18  18 20   Height:      Weight:      SpO2: 93%  92% 99%    General: arousable but tired. Pt awake all during night. Attempts to follow commands. Thin, frail appearing NAD Cardiovascular: RRR No MGR No LEE Respiratory: normal effort but somewhat shallow. No wheeze  Discharge Instructions      Discharge Orders    Future Orders Please Complete By Expires   Diet - low sodium heart healthy      Increase activity slowly      Discharge instructions      Comments:   Monitor BP daily for 2 weeks  Medication List     As of 02/23/2012 12:31 PM    STOP taking these medications         insulin detemir 100 UNIT/ML injection   Commonly known as: LEVEMIR      TAKE these medications         acetaminophen-codeine 300-30 MG per tablet   Commonly known as: TYLENOL #3   Take 1 tablet by mouth every 4 (four) hours as needed. For severe pain.      amLODipine 10 MG tablet    Commonly known as: NORVASC   Take 1 tablet (10 mg total) by mouth daily.      cilostazol 100 MG tablet   Commonly known as: PLETAL   Take 100 mg by mouth 2 (two) times daily.      diphenhydrAMINE 25 MG tablet   Commonly known as: SOMINEX   Take 25 mg by mouth 2 (two) times daily.      donepezil 5 MG tablet   Commonly known as: ARICEPT   Take 5 mg by mouth daily.      feeding supplement Liqd   Take 30 mLs by mouth 3 (three) times daily with meals.      fluconazole 100 MG tablet   Commonly known as: DIFLUCAN   Take 1 tablet (100 mg total) by mouth daily.      hydrALAZINE 10 MG tablet   Commonly known as: APRESOLINE   Take 1 tablet (10 mg total) by mouth 3 (three) times daily.      insulin aspart 100 UNIT/ML injection   Commonly known as: novoLOG   Inject 0-10 Units into the skin 4 (four) times daily - after meals and at bedtime. CBG 70-120: 0 u; CBG 121-150: 3 u; CBG 151-200: 4 u; CBG 201-250: 7 u; CBG 251-300: 11 u;CBG 301-350: 15 u; CBG 351-400: 20 u; CBG > 400: call MD      isosorbide mononitrate 20 MG tablet   Commonly known as: ISMO,MONOKET   Take 20 mg by mouth 2 (two) times daily.      megestrol 40 MG/ML suspension   Commonly known as: MEGACE   Take 200 mg by mouth daily.      multivitamin tablet   Take 1 tablet by mouth daily.      vitamin C 500 MG tablet   Commonly known as: ASCORBIC ACID   Take 500 mg by mouth daily.         Follow-up Information    Follow up with Alva Garnet., MD. Schedule an appointment as soon as possible for a visit in 1 week.   Contact information:   [address] [place] [address] [phone]           The results of significant diagnostics from this hospitalization (including imaging, microbiology, ancillary and laboratory) are listed below for reference.    Significant Diagnostic Studies: No results found.  Microbiology: Recent Results (from the past 240 hour(s))  URINE CULTURE     Status:  Normal   Collection Time   02/21/12  2:21 PM      Component Value Range Status Comment   Specimen Description URINE, CLEAN CATCH   Final    Special Requests NONE   Final    Culture  Setup Time 02/21/2012 15:40   Final    Colony Count 20,OOO COLONIES/ML   Final    Culture YEAST   Final    Report Status 02/22/2012 FINAL   Final  MRSA PCR SCREENING     Status: Abnormal   Collection Time   02/21/12  5:26 PM      Component Value Range Status Comment   MRSA by PCR POSITIVE (*) NEGATIVE Final      Labs: Basic Metabolic Panel:  Lab 02/23/12 1610 02/22/12 0658 02/21/12 1213  NA 141 141 140  K 3.6 3.7 3.3*  CL 106 105 106  CO2 22 21 21   GLUCOSE 170* 164* 68*  BUN 7 7 12   CREATININE 0.46* 0.46* 0.46*  CALCIUM 9.1 9.4 9.4  MG -- -- --  PHOS -- -- --   Liver Function Tests: No results found for this basename: AST:5,ALT:5,ALKPHOS:5,BILITOT:5,PROT:5,ALBUMIN:5 in the last 168 hours No results found for this basename: LIPASE:5,AMYLASE:5 in the last 168 hours No results found for this basename: AMMONIA:5 in the last 168 hours CBC:  Lab 02/22/12 0658 02/21/12 1213  WBC 4.9 7.4  NEUTROABS -- 6.0  HGB 12.9 14.1  HCT 37.1 40.0  MCV 87.1 87.1  PLT 246 232   Cardiac Enzymes: No results found for this basename: CKTOTAL:5,CKMB:5,CKMBINDEX:5,TROPONINI:5 in the last 168 hours BNP: BNP (last 3 results) No results found for this basename: PROBNP:3 in the last 8760 hours CBG:  Lab 02/23/12 0822 02/22/12 2145 02/22/12 1802 02/22/12 1219 02/22/12 0802  GLUCAP 147* 136* 213* 221* 161*       Signed:  BLACK,KAREN M  [facility] 02/23/2012, 12:31 PM

## 2012-02-23 NOTE — Progress Notes (Signed)
NURSING PROGRESS NOTE  Kristi Snow [id number] Discharge Data: 02/23/2012 3:49 PM Attending Provider: Clydia Llano, MD JXB:[id number],AOZHYQMV R., MD     Angelia Mould to be D/C'd Skilled nursing facility per MD order.  Discussed with the patient the After Visit Summary and all questions fully answered. All IV's discontinued with no bleeding noted. All belongings returned to patient for patient to take home.   Last Vital Signs:  Blood pressure 119/84, pulse 91, temperature 98.1 F (36.7 C), temperature source Axillary, resp. rate 16, height 5\' 8"  (1.727 m), weight 60.3 kg (132 lb 15 oz), SpO2 90.00%.  Discharge Medication List   Medication List     As of 02/23/2012  3:49 PM    STOP taking these medications         insulin detemir 100 UNIT/ML injection   Commonly known as: LEVEMIR      TAKE these medications         acetaminophen-codeine 300-30 MG per tablet   Commonly known as: TYLENOL #3   Take 1 tablet by mouth every 4 (four) hours as needed. For severe pain.      amLODipine 10 MG tablet   Commonly known as: NORVASC   Take 1 tablet (10 mg total) by mouth daily.      cilostazol 100 MG tablet   Commonly known as: PLETAL   Take 100 mg by mouth 2 (two) times daily.      diphenhydrAMINE 25 MG tablet   Commonly known as: SOMINEX   Take 25 mg by mouth 2 (two) times daily.      donepezil 5 MG tablet   Commonly known as: ARICEPT   Take 5 mg by mouth daily.      feeding supplement Liqd   Take 30 mLs by mouth 3 (three) times daily with meals.      fluconazole 100 MG tablet   Commonly known as: DIFLUCAN   Take 1 tablet (100 mg total) by mouth daily.      hydrALAZINE 10 MG tablet   Commonly known as: APRESOLINE   Take 1 tablet (10 mg total) by mouth 3 (three) times daily.      insulin aspart 100 UNIT/ML injection   Commonly known as: novoLOG   Inject 0-10 Units into the skin 4 (four) times daily - after meals and at bedtime. CBG 70-120: 0 u; CBG 121-150: 3 u; CBG 151-200: 4 u;  CBG 201-250: 7 u; CBG 251-300: 11 u;CBG 301-350: 15 u; CBG 351-400: 20 u; CBG > 400: call MD      isosorbide mononitrate 20 MG tablet   Commonly known as: ISMO,MONOKET   Take 20 mg by mouth 2 (two) times daily.      megestrol 40 MG/ML suspension   Commonly known as: MEGACE   Take 200 mg by mouth daily.      multivitamin tablet   Take 1 tablet by mouth daily.      vitamin C 500 MG tablet   Commonly known as: ASCORBIC ACID   Take 500 mg by mouth daily.        Rosalie Doctor, RN

## 2012-02-23 NOTE — Care Management Note (Signed)
    Page 1 of 1   02/23/2012     12:02:57 PM   CARE MANAGEMENT NOTE 02/23/2012  Patient:  Kristi Snow, Kristi Snow   Account Number:  [id number]  Date Initiated:  02/23/2012  Documentation initiated by:  Letha Cape  Subjective/Objective Assessment:   dx hypoglycemia  admit as observation- from [facility]     Action/Plan:   Anticipated DC Date:  02/23/2012   Anticipated DC Plan:  SKILLED NURSING FACILITY  In-house referral  Clinical Social Worker      DC Planning Services  CM consult      Choice offered to / List presented to:             Status of service:  Completed, signed off Medicare Important Message given?   (If response is "NO", the following Medicare IM given date fields will be blank) Date Medicare IM given:   Date Additional Medicare IM given:    Discharge Disposition:  SKILLED NURSING FACILITY  Per UR Regulation:  Reviewed for med. necessity/level of care/duration of stay  If discussed at Long Length of Stay Meetings, dates discussed:    Comments:  02/23/12 12:02 Letha Cape RN, BSN [phone] patient for dc today back to snf, CSW following.

## 2012-02-23 NOTE — Discharge Summary (Signed)
Addendum  Patient seen and examined, chart and data base reviewed.  I agree with the above assessment and the discharge plan.  For full details please see Mrs. Toya Smothers NP note.  Hypoglycemia associated with insulin in background of ? yeast UTI.  Because of patient's comorbidities including advanced dementia her long-acting insulin discontinued.  Reassess in the nursing home and restart Levemir as appropriate.   Clint Lipps, MD [facility] Pager: [phone] 02/23/2012, 1:41 PM

## 2012-02-28 ENCOUNTER — Encounter (HOSPITAL_BASED_OUTPATIENT_CLINIC_OR_DEPARTMENT_OTHER): Payer: Medicare Other

## 2012-03-27 ENCOUNTER — Telehealth: Payer: Self-pay | Admitting: Obstetrics and Gynecology

## 2012-03-27 NOTE — Telephone Encounter (Signed)
Tc to pt's son. Requests pt's wt at last visit. Informed only documentation  Is 10/27/10 which was 166 lb.

## 2012-05-29 ENCOUNTER — Inpatient Hospital Stay (HOSPITAL_COMMUNITY): Payer: Medicare Other

## 2012-05-29 ENCOUNTER — Inpatient Hospital Stay (HOSPITAL_COMMUNITY)
Admission: EM | Admit: 2012-05-29 | Discharge: 2012-06-01 | DRG: 757 | Disposition: A | Discharge status: Skilled Nursing Facility | Payer: Medicare Other | Attending: Internal Medicine | Admitting: Internal Medicine

## 2012-05-29 ENCOUNTER — Encounter (HOSPITAL_COMMUNITY): Payer: Self-pay | Admitting: Internal Medicine

## 2012-05-29 DIAGNOSIS — E162 Hypoglycemia, unspecified: Secondary | ICD-10-CM

## 2012-05-29 DIAGNOSIS — E43 Unspecified severe protein-calorie malnutrition: Secondary | ICD-10-CM | POA: Diagnosis present

## 2012-05-29 DIAGNOSIS — R1310 Dysphagia, unspecified: Secondary | ICD-10-CM | POA: Diagnosis present

## 2012-05-29 DIAGNOSIS — I739 Peripheral vascular disease, unspecified: Secondary | ICD-10-CM | POA: Diagnosis present

## 2012-05-29 DIAGNOSIS — E1165 Type 2 diabetes mellitus with hyperglycemia: Secondary | ICD-10-CM

## 2012-05-29 DIAGNOSIS — Z794 Long term (current) use of insulin: Secondary | ICD-10-CM

## 2012-05-29 DIAGNOSIS — E46 Unspecified protein-calorie malnutrition: Secondary | ICD-10-CM | POA: Diagnosis present

## 2012-05-29 DIAGNOSIS — I10 Essential (primary) hypertension: Secondary | ICD-10-CM | POA: Diagnosis present

## 2012-05-29 DIAGNOSIS — E1169 Type 2 diabetes mellitus with other specified complication: Secondary | ICD-10-CM | POA: Diagnosis present

## 2012-05-29 DIAGNOSIS — R4789 Other speech disturbances: Secondary | ICD-10-CM | POA: Diagnosis present

## 2012-05-29 DIAGNOSIS — Z681 Body mass index (BMI) 19 or less, adult: Secondary | ICD-10-CM

## 2012-05-29 DIAGNOSIS — B3749 Other urogenital candidiasis: Principal | ICD-10-CM | POA: Diagnosis present

## 2012-05-29 DIAGNOSIS — E11649 Type 2 diabetes mellitus with hypoglycemia without coma: Secondary | ICD-10-CM

## 2012-05-29 DIAGNOSIS — R4182 Altered mental status, unspecified: Secondary | ICD-10-CM

## 2012-05-29 DIAGNOSIS — N390 Urinary tract infection, site not specified: Secondary | ICD-10-CM | POA: Diagnosis present

## 2012-05-29 DIAGNOSIS — E876 Hypokalemia: Secondary | ICD-10-CM | POA: Diagnosis present

## 2012-05-29 DIAGNOSIS — F0390 Unspecified dementia without behavioral disturbance: Secondary | ICD-10-CM | POA: Diagnosis present

## 2012-05-29 LAB — CBC WITH DIFFERENTIAL/PLATELET
Basophils Absolute: 0 10*3/uL (ref 0.0–0.1)
Basophils Relative: 0 % (ref 0–1)
HCT: 42.7 % (ref 36.0–46.0)
Lymphocytes Relative: 16 % (ref 12–46)
MCHC: 34.7 g/dL (ref 30.0–36.0)
Neutro Abs: 4.5 10*3/uL (ref 1.7–7.7)
Neutrophils Relative %: 79 % — ABNORMAL HIGH (ref 43–77)
Platelets: 265 10*3/uL (ref 150–400)
RDW: 13.5 % (ref 11.5–15.5)
WBC: 5.6 10*3/uL (ref 4.0–10.5)

## 2012-05-29 LAB — GLUCOSE, CAPILLARY
Glucose-Capillary: 70 mg/dL (ref 70–99)
Glucose-Capillary: 128 mg/dL — ABNORMAL HIGH (ref 70–99)
Glucose-Capillary: 67 mg/dL — ABNORMAL LOW (ref 70–99)
Glucose-Capillary: 88 mg/dL (ref 70–99)
Glucose-Capillary: 94 mg/dL (ref 70–99)
Glucose-Capillary: 59 mg/dL — ABNORMAL LOW (ref 70–99)
Glucose-Capillary: 76 mg/dL (ref 70–99)
Glucose-Capillary: 145 mg/dL — ABNORMAL HIGH (ref 70–99)

## 2012-05-29 LAB — URINE MICROSCOPIC-ADD ON

## 2012-05-29 LAB — TSH: TSH: 1.072 u[IU]/mL (ref 0.350–4.500)

## 2012-05-29 LAB — BASIC METABOLIC PANEL
CO2: 27 mEq/L (ref 19–32)
Chloride: 103 mEq/L (ref 96–112)
Creatinine, Ser: 0.42 mg/dL — ABNORMAL LOW (ref 0.50–1.10)
GFR calc Af Amer: >90 mL/min (ref >90)
GFR calc Af Amer: >90 mL/min (ref >90)
GFR calc non Af Amer: >90 mL/min (ref >90)
Potassium: 3.1 mEq/L — ABNORMAL LOW (ref 3.5–5.1)
Potassium: 3.2 mEq/L — ABNORMAL LOW (ref 3.5–5.1)
Sodium: 140 mEq/L (ref 135–145)
Sodium: 141 mEq/L (ref 135–145)

## 2012-05-29 LAB — CBC
Hemoglobin: 14.1 g/dL (ref 12.0–15.0)
MCHC: 35 g/dL (ref 30.0–36.0)
RBC: 4.48 MIL/uL (ref 3.87–5.11)

## 2012-05-29 LAB — URINALYSIS, ROUTINE W REFLEX MICROSCOPIC
Glucose, UA: 250 mg/dL — AB
Ketones, ur: NEGATIVE mg/dL
Nitrite: NEGATIVE
pH: 7 (ref 5.0–8.0)

## 2012-05-29 LAB — HEMOGLOBIN A1C
Hgb A1c MFr Bld: 6.7 % — ABNORMAL HIGH (ref <5.7)
Mean Plasma Glucose: 146 mg/dL — ABNORMAL HIGH (ref <117)

## 2012-05-29 MED ORDER — DEXTROSE 50 % IV SOLN: 25.0000 mL | Freq: Once | INTRAVENOUS | Status: AC | PRN

## 2012-05-29 MED ORDER — DEXTROSE 50 % IV SOLN
50.0000 mL | Freq: Once | INTRAVENOUS | Status: AC
  Administered 2012-05-29: 50 mL via INTRAVENOUS

## 2012-05-29 MED ORDER — ACETAMINOPHEN 650 MG RE SUPP: 650.0000 mg | Freq: Four times a day (QID) | RECTAL | Status: DC | PRN

## 2012-05-29 MED ORDER — SODIUM CHLORIDE 0.9 % IJ SOLN
10.0000 mL | INTRAMUSCULAR | Status: DC | PRN
  Administered 2012-05-31: 10 mL

## 2012-05-29 MED ORDER — DEXTROSE 50 % IV SOLN
INTRAVENOUS | Status: AC
  Filled 2012-05-29: qty 50

## 2012-05-29 MED ORDER — KCL IN DEXTROSE-NACL 20-5-0.45 MEQ/L-%-% IV SOLN
Freq: Once | INTRAVENOUS | Status: AC
  Administered 2012-05-29: 06:00:00 via INTRAVENOUS
  Filled 2012-05-29: qty 1000

## 2012-05-29 MED ORDER — ONDANSETRON HCL 4 MG/2ML IJ SOLN: 4.0000 mg | Freq: Four times a day (QID) | INTRAMUSCULAR | Status: DC | PRN

## 2012-05-29 MED ORDER — HYDRALAZINE HCL 10 MG PO TABS
10.0000 mg | ORAL_TABLET | Freq: Three times a day (TID) | ORAL | Status: DC
  Administered 2012-05-29 – 2012-06-01 (×8): 10 mg via ORAL
  Filled 2012-05-29 (×12): qty 1

## 2012-05-29 MED ORDER — DEXTROSE 5 % IV SOLN: INTRAVENOUS | Status: DC

## 2012-05-29 MED ORDER — ENOXAPARIN SODIUM 40 MG/0.4ML ~~LOC~~ SOLN: 40.0000 mg | SUBCUTANEOUS | Status: DC

## 2012-05-29 MED ORDER — SODIUM CHLORIDE 0.9 % IV SOLN
INTRAVENOUS | Status: DC
  Administered 2012-05-29: 04:00:00 via INTRAVENOUS

## 2012-05-29 MED ORDER — DEXTROSE 5 % IV SOLN
1.0000 g | Freq: Every day | INTRAVENOUS | Status: AC
  Administered 2012-05-29 – 2012-05-31 (×3): 1 g via INTRAVENOUS
  Filled 2012-05-29 (×3): qty 10

## 2012-05-29 MED ORDER — KCL IN DEXTROSE-NACL 20-5-0.45 MEQ/L-%-% IV SOLN
INTRAVENOUS | Status: DC
  Administered 2012-05-29: 08:00:00 via INTRAVENOUS
  Filled 2012-05-29: qty 1000

## 2012-05-29 MED ORDER — ACETAMINOPHEN-CODEINE #3 300-30 MG PO TABS: 1.0000 | ORAL_TABLET | ORAL | Status: DC | PRN

## 2012-05-29 MED ORDER — GLUCAGON HCL (RDNA) 1 MG IJ SOLR: 0.5000 mg | Freq: Once | INTRAMUSCULAR | Status: AC | PRN

## 2012-05-29 MED ORDER — ONDANSETRON HCL 4 MG PO TABS: 4.0000 mg | ORAL_TABLET | Freq: Four times a day (QID) | ORAL | Status: DC | PRN

## 2012-05-29 MED ORDER — GLUCAGON HCL (RDNA) 1 MG IJ SOLR
1.0000 mg | Freq: Once | INTRAMUSCULAR | Status: AC | PRN
  Administered 2012-05-29: 1 mg via INTRAMUSCULAR

## 2012-05-29 MED ORDER — BIOTENE DRY MOUTH MT LIQD
15.0000 mL | Freq: Two times a day (BID) | OROMUCOSAL | Status: DC
  Administered 2012-05-29 – 2012-06-01 (×6): 15 mL via OROMUCOSAL

## 2012-05-29 MED ORDER — PRO-STAT SUGAR FREE PO LIQD
30.0000 mL | Freq: Three times a day (TID) | ORAL | Status: DC
  Administered 2012-05-30 – 2012-06-01 (×5): 30 mL via ORAL
  Filled 2012-05-29 (×13): qty 30

## 2012-05-29 MED ORDER — ISOSORBIDE MONONITRATE 20 MG PO TABS
20.0000 mg | ORAL_TABLET | Freq: Two times a day (BID) | ORAL | Status: DC
  Administered 2012-05-29 – 2012-06-01 (×6): 20 mg via ORAL
  Filled 2012-05-29 (×8): qty 1

## 2012-05-29 MED ORDER — CILOSTAZOL 100 MG PO TABS
100.0000 mg | ORAL_TABLET | Freq: Two times a day (BID) | ORAL | Status: DC
  Administered 2012-05-29 – 2012-06-01 (×6): 100 mg via ORAL
  Filled 2012-05-29 (×8): qty 1

## 2012-05-29 MED ORDER — ACETAMINOPHEN 325 MG PO TABS
650.0000 mg | ORAL_TABLET | Freq: Four times a day (QID) | ORAL | Status: DC | PRN
  Administered 2012-05-29: 650 mg via ORAL
  Filled 2012-05-29: qty 2

## 2012-05-29 MED ORDER — DEXTROSE 50 % IV SOLN: 25.0000 mL | Freq: Once | INTRAVENOUS | Status: DC

## 2012-05-29 MED ORDER — MEGESTROL ACETATE 40 MG/ML PO SUSP
200.0000 mg | Freq: Every day | ORAL | Status: DC
  Administered 2012-05-30: 200 mg via ORAL
  Filled 2012-05-29 (×2): qty 5

## 2012-05-29 MED ORDER — DONEPEZIL HCL 5 MG PO TABS
5.0000 mg | ORAL_TABLET | Freq: Every day | ORAL | Status: DC
  Administered 2012-05-30 – 2012-06-01 (×3): 5 mg via ORAL
  Filled 2012-05-29 (×5): qty 1

## 2012-05-29 MED ORDER — DEXTROSE 50 % IV SOLN: 50.0000 mL | Freq: Once | INTRAVENOUS | Status: DC

## 2012-05-29 MED ORDER — POTASSIUM CHLORIDE 2 MEQ/ML IV SOLN
INTRAVENOUS | Status: DC
  Administered 2012-05-29 (×2): via INTRAVENOUS
  Filled 2012-05-29 (×4): qty 1000

## 2012-05-29 MED ORDER — AMLODIPINE BESYLATE 10 MG PO TABS
10.0000 mg | ORAL_TABLET | Freq: Every day | ORAL | Status: DC
  Administered 2012-05-30 – 2012-06-01 (×3): 10 mg via ORAL
  Filled 2012-05-29 (×4): qty 1

## 2012-05-29 MED ORDER — ENOXAPARIN SODIUM 30 MG/0.3ML ~~LOC~~ SOLN
30.0000 mg | SUBCUTANEOUS | Status: DC
  Administered 2012-05-29 – 2012-06-01 (×4): 30 mg via SUBCUTANEOUS
  Filled 2012-05-29 (×4): qty 0.3

## 2012-05-29 NOTE — ED Notes (Signed)
CBG-70 

## 2012-05-29 NOTE — Progress Notes (Addendum)
[facility]                                                                                Patient Demographics  Kristi Snow, is a 79 y.o. female, DOB - 1924/11/12, ZOX:[id number], WJX:[id number]  Admit date - 05/29/2012  Admitting Physician Eduard Clos, MD  Outpatient Primary MD for the patient is Alva Garnet., MD  LOS - 0   Chief Complaint  Patient presents with  . Hypoglycemia        Assessment & Plan    1. Hypoglycemia in a patient with diabetes - continue with D5 half normal saline infusion. Have given her D50 along with glucagon, for now check CBG every 2 hours until blood sugars are consistently high. Poor IV access, if unable to obtain IV or fingersticks for Accu-Cheks will order PICC line. Question her by mouth intake with her poor mentation, she had a recent admission for the same problem a few weeks ago, at this point we'll discontinue all insulin upon discharge, considering her age and underlying status she does not warrant major hypoglycemia workup at this point. Have asked son to update [place] on the level of care he desires.   2. Hypertension - Continue Norvasc along with Imdur and monitor.   3. Peripheral vascular disease - consider aspirin outpatient if appropriate in her chronic state.   4. Protein calorie malnutrition. Once taking by mouth will resume her protein supplements and Megace.   5. Dementia home medications to be continued.   6. UTI Rocephin follow cultures.   7. Low potassium replaced recheck in the morning.   Code Status: Full for now  Family Communication: Son called and updated  Disposition Plan: SNF   Procedures    Consults     DVT Prophylaxis  Lovenox   Lab Results  Component Value Date   PLT 257 05/29/2012    Medications  Scheduled Meds: . amLODipine  10 mg Oral Daily  . antiseptic oral rinse  15 mL Mouth Rinse BID  . cilostazol  100 mg Oral BID  . dextrose  50 mL Intravenous Once  .  donepezil  5 mg Oral Daily  . enoxaparin (LOVENOX) injection  30 mg Subcutaneous Q24H  . feeding supplement  30 mL Oral TID WC  . hydrALAZINE  10 mg Oral TID  . isosorbide mononitrate  20 mg Oral BID  . megestrol  200 mg Oral Daily   Continuous Infusions: . dextrose 5 % with kcl 75 mL/hr at 05/29/12 0830  . dextrose     PRN Meds:.acetaminophen, acetaminophen, acetaminophen-codeine, dextrose, ondansetron (ZOFRAN) IV, ondansetron  Antibiotics    Anti-infectives   None       Time Spent in minutes   35   Susa Raring K M.D on 05/29/2012 at 8:52 AM  Between 7am to 7pm - Pager - [phone]  After 7pm go to [email or web address] - password [email or web address]  And look for the night coverage person covering for me after hours  [facility] Office  [phone]    Subjective:   [name] today remains nonverbal, unable to provide any review of systems but appears overall comfortable, does grind her teeth.  Objective:  Filed Vitals:   05/29/12 0237 05/29/12 0657  BP: 151/87 135/93  Pulse: 77 84  Temp: 97.5 F (36.4 C) 98 F (36.7 C)  TempSrc: Axillary Axillary  Resp: 18 16  Weight:  44.5 kg (98 lb 1.7 oz)  SpO2: 100% 95%    Wt Readings from Last 3 Encounters:  05/29/12 44.5 kg (98 lb 1.7 oz)  02/22/12 60.3 kg (132 lb 15 oz)  10/06/11 60.2 kg (132 lb 11.5 oz)     Intake/Output Summary (Last 24 hours) at 05/29/12 0852 Last data filed at 05/29/12 0606  Gross per 24 hour  Intake    500 ml  Output      0 ml  Net    500 ml    Exam Awake not alert, appears to be have advanced underlying dementia, grinding her teeth nonstop [place].AT,PERRAL Supple Neck,No JVD, No cervical lymphadenopathy appriciated.  Symmetrical Chest wall movement, Good air movement bilaterally, CTAB RRR,No Gallops,Rubs or new Murmurs, No Parasternal Heave +ve B.Sounds, Abd Soft, Non tender, No organomegaly appriciated, No rebound - guarding or rigidity. No Cyanosis, Clubbing or edema, No new Rash  or bruise     Data Review   Micro Results No results found for this or any previous visit (from the past 240 hour(s)).  Radiology Reports No results found.  CBC  Recent Labs Lab 05/29/12 0415 05/29/12 0815  WBC 5.6 5.8  HGB 14.8 14.1  HCT 42.7 40.3  PLT 265 257  MCV 90.1 90.0  MCH 31.2 31.5  MCHC 34.7 35.0  RDW 13.5 13.5  LYMPHSABS 0.9  --   MONOABS 0.3  --   EOSABS 0.0  --   BASOSABS 0.0  --     Chemistries   Recent Labs Lab 05/29/12 0415  NA 140  K 3.1*  CL 103  CO2 27  GLUCOSE 125*  BUN 11  CREATININE 0.42*  CALCIUM 9.6   ------------------------------------------------------------------------------------------------------------------ CrCl is unknown because both a height and weight (above a minimum accepted value) are required for this calculation. ------------------------------------------------------------------------------------------------------------------ No results found for this basename: HGBA1C,  in the last 72 hours ------------------------------------------------------------------------------------------------------------------ No results found for this basename: CHOL, HDL, LDLCALC, TRIG, CHOLHDL, LDLDIRECT,  in the last 72 hours ------------------------------------------------------------------------------------------------------------------ No results found for this basename: TSH, T4TOTAL, FREET3, T3FREE, THYROIDAB,  in the last 72 hours ------------------------------------------------------------------------------------------------------------------ No results found for this basename: VITAMINB12, FOLATE, FERRITIN, TIBC, IRON, RETICCTPCT,  in the last 72 hours  Coagulation profile No results found for this basename: INR, PROTIME,  in the last 168 hours  No results found for this basename: DDIMER,  in the last 72 hours  Cardiac Enzymes No results found for this basename: CK, CKMB, TROPONINI, MYOGLOBIN,  in the last 168  hours ------------------------------------------------------------------------------------------------------------------ No components found with this basename: POCBNP,

## 2012-05-29 NOTE — Care Management (Signed)
CARE MANAGEMENT NOTE 05/29/2012  Patient:  Kristi Snow, Kristi Snow   Account Number:  [id number]  Date Initiated:  05/29/2012  Documentation initiated by:  Mykel Mohl  Subjective/Objective Assessment:   79 yo female admitted with hypoglycemia. PTA pt from SNF.     Action/Plan:   SNF   Anticipated DC Date:     Anticipated DC Plan:  SKILLED NURSING FACILITY  In-house referral  Clinical Social Worker      DC Associate Professor  CM consult      Choice offered to / List presented to:  NA   DME arranged  NA      DME agency  NA     HH arranged  NA      HH agency  NA   Status of service:  Completed, signed off Medicare Important Message given?   (If response is "NO", the following Medicare IM given date fields will be blank) Date Medicare IM given:   Date Additional Medicare IM given:    Discharge Disposition:    Per UR Regulation:  Reviewed for med. necessity/level of care/duration of stay  If discussed at Long Length of Stay Meetings, dates discussed:    Comments:  05/29/12 1304 Kristi Faller,RN,BSN [phone] Pt's current dc plan includes returning to SNF upon discharge.

## 2012-05-29 NOTE — H&P (Signed)
[facility] History and Physical  Shawndrea Rutkowski ZOX:[id number] DOB: [birth year] DOA: 05/29/2012  Referring physician: Patsy Lager. PCP: Alva Garnet., MD  Specialists: None.  Chief Complaint: Was found lethargic and hypoglycemic.  HPI: Kristi Snow is a 79 y.o. female was brought from the nursing home after patient was found lethargic and CBG checks was around 29. Patient initially was given D50 twice and blood sugar improved but gradually declined again and at this time patient has been started on D5 IV infusion had been for further observation. Patient has advanced dementia and does not contribute anything to the history. Patient otherwise is not in acute distress.  Review of Systems: As presented in the history of presenting illness, rest negative.  Past Medical History  Diagnosis Date  . Hypertension   . Diabetes mellitus   . Encephalopathy, unspecified   . Urinary tract infection   . Dementia   . Prolapsed uterus   . Lumbar spinal stenosis   . Crystal arthritis 07/09/2011   History reviewed. No pertinent past surgical history. Social History:  reports that she quit smoking about 45 years ago. She has never used smokeless tobacco. She reports that she does not drink alcohol or use illicit drugs. Lives at nursing home. where does patient live-- Cannot do ADLs. Can patient participate in ADLs?  No Known Allergies  History reviewed. No pertinent family history.    Prior to Admission medications   Medication Sig Start Date End Date Taking? Authorizing Provider  acetaminophen-codeine (TYLENOL #3) 300-30 MG per tablet Take 1 tablet by mouth every 4 (four) hours as needed. For severe pain.    Historical Provider, MD  amLODipine (NORVASC) 10 MG tablet Take 1 tablet (10 mg total) by mouth daily. 06/14/11 06/13/12  Leroy Sea, MD  cilostazol (PLETAL) 100 MG tablet Take 100 mg by mouth 2 (two) times daily.      Historical Provider, MD  diphenhydrAMINE (SOMINEX) 25 MG tablet  Take 25 mg by mouth 2 (two) times daily.    Historical Provider, MD  donepezil (ARICEPT) 5 MG tablet Take 5 mg by mouth daily.     Historical Provider, MD  feeding supplement (PRO-STAT SUGAR FREE 64) LIQD Take 30 mLs by mouth 3 (three) times daily with meals. 02/23/12   Gwenyth Bender, NP  fluconazole (DIFLUCAN) 100 MG tablet Take 1 tablet (100 mg total) by mouth daily. 02/23/12   Gwenyth Bender, NP  hydrALAZINE (APRESOLINE) 10 MG tablet Take 1 tablet (10 mg total) by mouth 3 (three) times daily. 05/27/11 05/26/12  Ripudeep Jenna Luo, MD  insulin aspart (NOVOLOG) 100 UNIT/ML injection Inject 0-10 Units into the skin 4 (four) times daily - after meals and at bedtime. CBG 70-120: 0 u; CBG 121-150: 3 u; CBG 151-200: 4 u; CBG 201-250: 7 u; CBG 251-300: 11 u;CBG 301-350: 15 u; CBG 351-400: 20 u; CBG > 400: call MD 07/09/11   Maryruth Bun Rama, MD  isosorbide mononitrate (ISMO,MONOKET) 20 MG tablet Take 20 mg by mouth 2 (two) times daily.     Historical Provider, MD  megestrol (MEGACE) 40 MG/ML suspension Take 200 mg by mouth daily.    Historical Provider, MD  Multiple Vitamin (MULTIVITAMIN) tablet Take 1 tablet by mouth daily.    Historical Provider, MD  vitamin C (ASCORBIC ACID) 500 MG tablet Take 500 mg by mouth daily.    Historical Provider, MD   Physical Exam: Filed Vitals:   05/29/12 0237  BP: 151/87  Pulse: 77  Temp: 97.5 F (  36.4 C)  TempSrc: Axillary  Resp: 18  SpO2: 100%     General:  Well-developed poorly nourished.  Eyes: Anicteric no pallor.  ENT: No discharge from the ears eyes nose and mouth.  Neck: No JVD.  Cardiovascular: S1-S2 heard.  Respiratory: No rhonchi or crepitations.  Abdomen: Soft nontender.  Skin: No rash.  Musculoskeletal: No edema.  Psychiatric: Demented.  Neurologic: Does not follow commands.  Labs on Admission:  Basic Metabolic Panel:  Recent Labs Lab 05/29/12 0415  NA 140  K 3.1*  CL 103  CO2 27  GLUCOSE 125*  BUN 11  CREATININE 0.42*  CALCIUM  9.6   Liver Function Tests: No results found for this basename: AST, ALT, ALKPHOS, BILITOT, PROT, ALBUMIN,  in the last 168 hours No results found for this basename: LIPASE, AMYLASE,  in the last 168 hours No results found for this basename: AMMONIA,  in the last 168 hours CBC:  Recent Labs Lab 05/29/12 0415  WBC 5.6  NEUTROABS 4.5  HGB 14.8  HCT 42.7  MCV 90.1  PLT 265   Cardiac Enzymes: No results found for this basename: CKTOTAL, CKMB, CKMBINDEX, TROPONINI,  in the last 168 hours  BNP (last 3 results) No results found for this basename: PROBNP,  in the last 8760 hours CBG:  Recent Labs Lab 05/29/12 0300 05/29/12 0422 05/29/12 0535  GLUCAP 70 128* 91    Radiological Exams on Admission: No results found.   Assessment/Plan Active Problems:   Dementia   Hypertension   Hypoglycemia associated with diabetes   PVD (peripheral vascular disease)   1. Hypoglycemia in a patient with diabetes - continue with D5 half normal saline infusion. For now check CBG every 2 hours until blood sugars are consistently high. Patient has been eating and not sure what caused the hypoglycemia. Check hemoglobin A1c. 2. Hypertension -  Continue present medications. 3. Peripheral vascular disease - continue present medications. 4. Protein calorie malnutrition.    Code Status: Full code.  Family Communication: None.  Disposition Plan: Admit to inpatient.    Lavert Matousek N. [facility] Pager [phone].  If 7PM-7AM, please contact night-coverage [email or web address] Password [facility] 05/29/2012, 6:08 AM

## 2012-05-29 NOTE — Progress Notes (Signed)
Peripherally Inserted Central Catheter/Midline Placement  The IV Nurse has discussed with the patient and/or persons authorized to consent for the patient, the purpose of this procedure and the potential benefits and risks involved with this procedure.  The benefits include less needle sticks, lab draws from the catheter and patient may be discharged home with the catheter.  Risks include, but not limited to, infection, bleeding, blood clot (thrombus formation), and puncture of an artery; nerve damage and irregular heat beat.  Alternatives to this procedure were also discussed.  PICC/Midline Placement Documentation  PICC / Midline Single Lumen 05/29/12 PICC Left Basilic (Active)  Dressing Change Due 06/05/12 05/29/2012  2:00 PM    Telephone consent via Alissa Pharr (son)   Dorena Bodo 05/29/2012, 2:50 PM

## 2012-05-29 NOTE — Progress Notes (Signed)
PICC tip in rt atrium.  Retracted 4cm and staff RN to contact physician for an order to re Xray to confirm placement.  Sterile technique used

## 2012-05-29 NOTE — ED Provider Notes (Signed)
History     CSN: [id number] Arrival date & time 05/29/12  4098 First MD Initiated Contact with Patient 05/29/12 0305      Chief Complaint  Patient presents with  . Hypoglycemia   Level V caveat dementia HPI The patient was sent to the emergency room from her nursing facility for an episode of hypoglycemia. Patient has history of diabetes as well as dementia. Patient was found to have a CBG of 29 by EMS at the nursing facility. 25 g of D50 was administered at the scene. Patient became more alert after that treatment. Blood sugar increased to 101 Past Medical History  Diagnosis Date  . Hypertension   . Diabetes mellitus   . Encephalopathy, unspecified   . Urinary tract infection   . Dementia   . Prolapsed uterus   . Lumbar spinal stenosis   . Crystal arthritis 07/09/2011    No past surgical history on file.  No family history on file.  History  Substance Use Topics  . Smoking status: Former Smoker    Quit date: 02/21/1967  . Smokeless tobacco: Never Used  . Alcohol Use: No    OB History   Grav Para Term Preterm Abortions TAB SAB Ect Mult Living                  Review of Systems  Allergies  Review of patient's allergies indicates no known allergies.  Home Medications   Current Outpatient Rx  Name  Route  Sig  Dispense  Refill  . acetaminophen-codeine (TYLENOL #3) 300-30 MG per tablet   Oral   Take 1 tablet by mouth every 4 (four) hours as needed. For severe pain.         Marland Kitchen amLODipine (NORVASC) 10 MG tablet   Oral   Take 1 tablet (10 mg total) by mouth daily.         . cilostazol (PLETAL) 100 MG tablet   Oral   Take 100 mg by mouth 2 (two) times daily.           . diphenhydrAMINE (SOMINEX) 25 MG tablet   Oral   Take 25 mg by mouth 2 (two) times daily.         Marland Kitchen donepezil (ARICEPT) 5 MG tablet   Oral   Take 5 mg by mouth daily.          . feeding supplement (PRO-STAT SUGAR FREE 64) LIQD   Oral   Take 30 mLs by mouth 3 (three) times daily  with meals.   900 mL      . fluconazole (DIFLUCAN) 100 MG tablet   Oral   Take 1 tablet (100 mg total) by mouth daily.   10 tablet   0   . EXPIRED: hydrALAZINE (APRESOLINE) 10 MG tablet   Oral   Take 1 tablet (10 mg total) by mouth 3 (three) times daily.         . insulin aspart (NOVOLOG) 100 UNIT/ML injection   Subcutaneous   Inject 0-10 Units into the skin 4 (four) times daily - after meals and at bedtime. CBG 70-120: 0 u; CBG 121-150: 3 u; CBG 151-200: 4 u; CBG 201-250: 7 u; CBG 251-300: 11 u;CBG 301-350: 15 u; CBG 351-400: 20 u; CBG > 400: call MD   1 vial      . isosorbide mononitrate (ISMO,MONOKET) 20 MG tablet   Oral   Take 20 mg by mouth 2 (two) times daily.          Marland Kitchen  megestrol (MEGACE) 40 MG/ML suspension   Oral   Take 200 mg by mouth daily.         . Multiple Vitamin (MULTIVITAMIN) tablet   Oral   Take 1 tablet by mouth daily.         . vitamin C (ASCORBIC ACID) 500 MG tablet   Oral   Take 500 mg by mouth daily.           BP 151/87  Pulse 77  Temp(Src) 97.5 F (36.4 C) (Axillary)  Resp 18  SpO2 100%  Physical Exam  Nursing note and vitals reviewed. Constitutional: No distress.  HENT:  Head: Normocephalic and atraumatic.  Right Ear: External ear normal.  Left Ear: External ear normal.  Mouth/Throat: No oropharyngeal exudate.  Eyes: Conjunctivae are normal. Right eye exhibits no discharge. Left eye exhibits no discharge. No scleral icterus.  Neck: Neck supple. No tracheal deviation present.  Cardiovascular: Normal rate, regular rhythm and intact distal pulses.   Pulmonary/Chest: Effort normal and breath sounds normal. No stridor. No respiratory distress. She has no wheezes. She has no rales.  Abdominal: Soft. Bowel sounds are normal. She exhibits no distension. There is no tenderness. There is no rebound and no guarding.  Musculoskeletal: She exhibits no edema and no tenderness.  Neurological: No cranial nerve deficit ( no gross defecits  noted) or sensory deficit. She exhibits normal muscle tone. She displays no seizure activity.  Patient's eyes are open and she will look at me when speaking to her however she does not follow commands and does not speak  Skin: Skin is warm and dry. No rash noted.  Psychiatric: She has a normal mood and affect.    ED Course  Procedures (including critical care time) Medications  0.9 %  sodium chloride infusion ( Intravenous New Bag/Given 05/29/12 0331)  dextrose 5 % and 0.45 % NaCl with KCl 20 mEq/L infusion (not administered)  dextrose 50 % solution 50 mL (50 mLs Intravenous Given 05/29/12 0331)    Labs Reviewed  CBC WITH DIFFERENTIAL - Abnormal; Notable for the following:    Neutrophils Relative 79 (*)    All other components within normal limits  BASIC METABOLIC PANEL - Abnormal; Notable for the following:    Potassium 3.1 (*)    Glucose, Bld 125 (*)    Creatinine, Ser 0.42 (*)    All other components within normal limits  GLUCOSE, CAPILLARY - Abnormal; Notable for the following:    Glucose-Capillary 128 (*)    All other components within normal limits  GLUCOSE, CAPILLARY  GLUCOSE, CAPILLARY  URINALYSIS, ROUTINE W REFLEX MICROSCOPIC   No results found.   1. Hypoglycemia       MDM  Patient's blood sugar continued to decrease despite 2 of D50.  Patient's creatinine is unremarkable. Hyperglycemia may be related to decreased oral intake. She'll be admitted to the hospital for monitoring and IV dextrose infusion        Celene Kras, MD 05/29/12 [phone]

## 2012-05-29 NOTE — ED Notes (Signed)
ZOX:[id number]<AV> Expected date:05/29/12<BR> Expected time: 2:09 AM<BR> Means of arrival:Ambulance<BR> Comments:<BR> hypoglycemic

## 2012-05-29 NOTE — ED Notes (Signed)
Brought in by EMS from [facility] facility d/t unresponsiveness.  Per EMS, pt is a known diabetic; pt's CBG = 29 on EMS' arrival and glucotest; pt was given D50 25gms per IV at the scene--- pt became responsive after D50 and pt's blood sugar went up to 101 en route to ED.  Pt arrived to ED room alert and responsive, skin warm and dry; pt has dementia.

## 2012-05-30 DIAGNOSIS — N390 Urinary tract infection, site not specified: Secondary | ICD-10-CM

## 2012-05-30 LAB — GLUCOSE, CAPILLARY
Glucose-Capillary: 91 mg/dL (ref 70–99)
Glucose-Capillary: 66 mg/dL — ABNORMAL LOW (ref 70–99)
Glucose-Capillary: 129 mg/dL — ABNORMAL HIGH (ref 70–99)
Glucose-Capillary: 324 mg/dL — ABNORMAL HIGH (ref 70–99)
Glucose-Capillary: 254 mg/dL — ABNORMAL HIGH (ref 70–99)
Glucose-Capillary: 310 mg/dL — ABNORMAL HIGH (ref 70–99)
Glucose-Capillary: 280 mg/dL — ABNORMAL HIGH (ref 70–99)

## 2012-05-30 LAB — BASIC METABOLIC PANEL
CO2: 21 mEq/L (ref 19–32)
Chloride: 105 mEq/L (ref 96–112)
GFR calc Af Amer: >90 mL/min (ref >90)
GFR calc non Af Amer: 86 mL/min — ABNORMAL LOW (ref >90)
Glucose, Bld: 103 mg/dL — ABNORMAL HIGH (ref 70–99)

## 2012-05-30 LAB — CBC
Hemoglobin: 13.2 g/dL (ref 12.0–15.0)
MCHC: 33.7 g/dL (ref 30.0–36.0)
RDW: 13.8 % (ref 11.5–15.5)
WBC: 5.2 10*3/uL (ref 4.0–10.5)

## 2012-05-30 MED ORDER — DEXTROSE 5 % IV SOLN
INTRAVENOUS | Status: DC
  Administered 2012-05-30: 07:00:00 via INTRAVENOUS

## 2012-05-30 MED ORDER — ENSURE COMPLETE PO LIQD
237.0000 mL | Freq: Two times a day (BID) | ORAL | Status: DC
  Administered 2012-05-30 – 2012-06-01 (×4): 237 mL via ORAL

## 2012-05-30 MED ORDER — FLUCONAZOLE 100MG IVPB
100.0000 mg | INTRAVENOUS | Status: DC
  Administered 2012-05-30 – 2012-05-31 (×2): 100 mg via INTRAVENOUS
  Filled 2012-05-30 (×3): qty 50

## 2012-05-30 MED ORDER — ADULT MULTIVITAMIN LIQUID CH
5.0000 mL | Freq: Every day | ORAL | Status: DC
  Administered 2012-05-30 – 2012-06-01 (×3): 5 mL via ORAL
  Filled 2012-05-30 (×3): qty 5

## 2012-05-30 MED ORDER — DRONABINOL 2.5 MG PO CAPS
2.5000 mg | ORAL_CAPSULE | Freq: Every day | ORAL | Status: DC
  Administered 2012-05-31 – 2012-06-01 (×2): 2.5 mg via ORAL
  Filled 2012-05-30 (×2): qty 1

## 2012-05-30 NOTE — Progress Notes (Signed)
CBG was taken and was 346. MD notified. Will continue to monitor.

## 2012-05-30 NOTE — Progress Notes (Signed)
INITIAL NUTRITION ASSESSMENT  Pt meets criteria for severe MALNUTRITION in the context of chronic illness as evidenced by 25.7% weight loss in the past 3 months in addition to pt with visible severe muscle wasting and subcutaneous fat loss in clavicles, upper arms, temporal, and orbital region.  DOCUMENTATION CODES Per approved criteria  -Severe malnutrition in the context of chronic illness -Underweight   INTERVENTION: - Recommend SLP evaluation as family reports pt only able to swallow liquids via plastic syringe - text paged MD with this request  - Ensure Complete TID - Multivitamin 5mL oral daily - Will continue to monitor   NUTRITION DIAGNOSIS: Unintended weight loss related to dementia, inadequate oral intake as evidenced by weight trend, family report.   Goal: Pt to consume >90% of meals/supplements.   Monitor:  Weights, labs, intake, swallow function  Reason for Assessment: Nutrition risk, low braden  79 y.o. female  Admitting Dx: Lethargy and hypoglycemia  ASSESSMENT: Pt from the nursing home after patient was found lethargic and CBG checks was around 29. Patient has advanced dementia.  Upon entering room, pt was being syringe fed Ensure by pt's niece. Niece reports that in the past 4-5 months pt has lost over 100 pounds. She reports pt used to be a size 18 and weigh over 200 ponds. She states pt's dysphagia is new for her. She was using plastic syringe because she said pt was having problems sucking through a straw. Performed nutrition focused physical exam.   Nutrition Focused Physical Exam:  Subcutaneous Fat:  Orbital Region: severe wasting  Upper Arm Region: severe wasting  Thoracic and Lumbar Region: NA  Muscle:  Temple Region: severe wasting  Clavicle Bone Region: severe wasting  Clavicle and Acromion Bone Region: severe wasting  Scapular Bone Region: NA Dorsal Hand: severe wasting Patellar Region: mild/muscle wasting Anterior Thigh Region: mild/muscle  wasting Posterior Calf Region: mild/muscle wasting  Edema: None observed     Height: Ht Readings from Last 1 Encounters:  02/22/12 5\' 8"  (1.727 m)    Weight: Wt Readings from Last 1 Encounters:  05/29/12 98 lb 1.7 oz (44.5 kg)    Ideal Body Weight: 140 lb  % Ideal Body Weight: 70  Wt Readings from Last 10 Encounters:  05/29/12 98 lb 1.7 oz (44.5 kg)  02/22/12 132 lb 15 oz (60.3 kg)  10/06/11 132 lb 11.5 oz (60.2 kg)  07/07/11 153 lb (69.4 kg)  06/14/11 153 lb (69.4 kg)  05/26/11 147 lb 4.3 oz (66.8 kg)  03/09/11 151 lb 3.8 oz (68.6 kg)  11/08/10 165 lb (74.844 kg)    Usual Body Weight: 132 lb in January 2014  % Usual Body Weight: 74  BMI:  Body mass index is 14.92 kg/(m^2). Underweight  Estimated Nutritional Needs: Kcal: 1550-1950 Protein: 80-90g Fluid: 1.5-1.9L/day  Skin: Intact   Diet Order: Dysphagia 1, thin  EDUCATION NEEDS: -No education needs identified at this time   Intake/Output Summary (Last 24 hours) at 05/30/12 1154 Last data filed at 05/30/12 1046  Gross per 24 hour  Intake    600 ml  Output      0 ml  Net    600 ml    Last BM: 4/16  Labs:   Recent Labs Lab 05/29/12 0415 05/29/12 0815 05/30/12 0355  NA 140 141 139  K 3.1* 3.2* 4.3  CL 103 105 105  CO2 27 30 21   BUN 11 9 7   CREATININE 0.42* 0.43* 0.49*  CALCIUM 9.6 9.3 9.0  GLUCOSE 125*  121* 103*    CBG (last 3)   Recent Labs  05/30/12 0657 05/30/12 0811 05/30/12 0950  GLUCAP 129* 125* 124*    Scheduled Meds: . amLODipine  10 mg Oral Daily  . antiseptic oral rinse  15 mL Mouth Rinse BID  . cefTRIAXone (ROCEPHIN)  IV  1 g Intravenous Daily  . cilostazol  100 mg Oral BID  . dextrose  50 mL Intravenous Once  . donepezil  5 mg Oral Daily  . enoxaparin (LOVENOX) injection  30 mg Subcutaneous Q24H  . feeding supplement  30 mL Oral TID WC  . hydrALAZINE  10 mg Oral TID  . isosorbide mononitrate  20 mg Oral BID  . megestrol  200 mg Oral Daily    Continuous  Infusions: . dextrose      Past Medical History  Diagnosis Date  . Hypertension   . Diabetes mellitus   . Encephalopathy, unspecified   . Urinary tract infection   . Dementia   . Prolapsed uterus   . Lumbar spinal stenosis   . Crystal arthritis 07/09/2011    History reviewed. No pertinent past surgical history.   Kristi Hedger MS, RD, LDN [phone] Pager [phone] After Hours Pager

## 2012-05-30 NOTE — Progress Notes (Signed)
Clinical Social Work Department BRIEF PSYCHOSOCIAL ASSESSMENT 05/30/2012  Patient:  Kristi Snow, Kristi Snow     Account Number:  [id number]     Admit date:  05/29/2012  Clinical Social Worker:  Jacelyn Grip  Date/Time:  05/30/2012 11:00 AM  Referred by:  Physician  Date Referred:  05/30/2012 Referred for  SNF Placement   Other Referral:   Interview type:  Family Other interview type:    PSYCHOSOCIAL DATA Living Status:  FACILITY Admitted from facility:  [facility] Level of care:  Skilled Nursing Facility Primary support name:  Casimiro Needle Graziosi/[phone] Primary support relationship to patient:  CHILD, ADULT Degree of support available:   adequate    CURRENT CONCERNS Current Concerns  Post-Acute Placement   Other Concerns:    SOCIAL WORK ASSESSMENT / PLAN CSW received notification that admitted from [facility].    CSW spoke to pt son via telephone who confirmed plan is for pt to return to [facility] when medically stable. Per son, pt has been at [facility] since April 2013.    CSW contacted [facility] and left message for admissions coordinator.    CSW to continue to follow and facilitate pt discharge needs back to [facility] when pt medically ready for discharge.   Assessment/plan status:  Psychosocial Support/Ongoing Assessment of Needs Other assessment/ plan:   discharge planning   Information/referral to community resources:   FL2 completed and faxed with H&P and pt meds to [facility].    PATIENT'S/FAMILY'S RESPONSE TO PLAN OF CARE: Pt with advanced dementia and unable to participate in assessment. Pt son agreeable to pt return to [facility].      Jacklynn Lewis, MSW, LCSWA  Clinical Social Work [phone]

## 2012-05-30 NOTE — Progress Notes (Signed)
[facility] PROGRESS NOTE  Aideliz Garmany IEP:[id number] DOB: 08/26/1924 DOA: 05/29/2012 PCP: Alva Garnet., MD  Assessment/Plan: Active Problems:   Dementia   Hypertension: Blood pressure stable. Continue home meds.    Hypoglycemia associated with diabetes: Likely secondary to acute illness. A1c noted to be 6.7, so low blood sugars are not new for patient periods combination of illness plus poor by mouth intake   PVD (peripheral vascular disease): Noted. Stable.  Severe protein calorie malnutrition (as evidenced by chronic illness and 25.7% weight loss in the past 3 months with visible severe muscle wasting and subcutaneous fat loss): Appreciate dietitian consult. Will ask speech therapy to see. Only able to swallow liquids via plastics arranged.  UTI: Cultures pending. Mild. Of note have yeast, so added Diflucan.  Code Status: Full code  Family Communication: Left message for sun.  Disposition Plan: Back to skilled nursing facility when CBG stabilized   Consultants:  None  Procedures:  None  Antibiotics:  IV Rocephin , day 3  IV Diflucan started today  HPI/Subjective: Patient nonverbal. Unable to give me any subjective complaints  Objective: Filed Vitals:   05/29/12 1420 05/29/12 2004 05/30/12 0415 05/30/12 1336  BP: 128/76 138/68 143/64 139/67  Pulse: 90 88 51 87  Temp: 98 F (36.7 C) 98.5 F (36.9 C) 98.7 F (37.1 C) 98 F (36.7 C)  TempSrc: Axillary Axillary Axillary Axillary  Resp: 16 16 15 15   Weight:      SpO2: 95%  100% 99%    Intake/Output Summary (Last 24 hours) at 05/30/12 1509 Last data filed at 05/30/12 1207  Gross per 24 hour  Intake    840 ml  Output      0 ml  Net    840 ml   Filed Weights   05/29/12 0657  Weight: 44.5 kg (98 lb 1.7 oz)    Exam:   General:  Confused, nonverbal  Cardiovascular: Regular rate and rhythm, S1-S2  Respiratory: Poor inspiratory effort  Abdomen: Soft, non-distended, positive bowel  sounds  Musculoskeletal: No clubbing or cyanosis or edema   Data Reviewed: Basic Metabolic Panel:  Recent Labs Lab 05/29/12 0415 05/29/12 0815 05/30/12 0355  NA 140 141 139  K 3.1* 3.2* 4.3  CL 103 105 105  CO2 27 30 21   GLUCOSE 125* 121* 103*  BUN 11 9 7   CREATININE 0.42* 0.43* 0.49*  CALCIUM 9.6 9.3 9.0   CBC:  Recent Labs Lab 05/29/12 0415 05/29/12 0815 05/30/12 0355  WBC 5.6 5.8 5.2  NEUTROABS 4.5  --   --   HGB 14.8 14.1 13.2  HCT 42.7 40.3 39.2  MCV 90.1 90.0 91.2  PLT 265 257 233   CBG:  Recent Labs Lab 05/30/12 0657 05/30/12 0811 05/30/12 0950 05/30/12 1202 05/30/12 1346  GLUCAP 129* 125* 124* 346* 373*      Studies: Dg Chest 1 View  05/30/2012 and IMPRESSION: PICC line repositioning with tip at the SVC right atrial level. Persistent right perihilar opacity, possibly atelectasis or infiltrate.  Again, follow-up is necessary to exclude a developing mass.   Original Report Authenticated By: Carey Bullocks, M.D.    Dg Chest Port 1 View  05/29/2012 IMPRESSION:  1.  Left arm PICC tip projects over the mid right atrium.  This should be withdrawn approximately 6 cm. 2.  Suboptimal inspiration.  Vague opacity in the right mid lung likely representing focal pneumonia.  Follow-up chest x-ray after treatment is suggested to be sure this resolves.  These results will  be called to the ordering clinician or representative by the Radiologist Assistant, and communication documented in the PACS Dashboard.   Original Report Authenticated By: Hulan Saas, M.D.     Scheduled Meds: . amLODipine  10 mg Oral Daily  . antiseptic oral rinse  15 mL Mouth Rinse BID  . cefTRIAXone (ROCEPHIN)  IV  1 g Intravenous Daily  . cilostazol  100 mg Oral BID  . dextrose  50 mL Intravenous Once  . donepezil  5 mg Oral Daily  . enoxaparin (LOVENOX) injection  30 mg Subcutaneous Q24H  . feeding supplement  237 mL Oral BID BM  . feeding supplement  30 mL Oral TID WC  .  hydrALAZINE  10 mg Oral TID  . isosorbide mononitrate  20 mg Oral BID  . megestrol  200 mg Oral Daily  . multivitamin  5 mL Oral Daily   Continuous Infusions: . dextrose      Active Problems:   Dementia   Hypertension   Hypoglycemia associated with diabetes   PVD (peripheral vascular disease)    Time spent: 20 minutes    Hollice Espy  [facility] Pager [phone]. If 7PM-7AM, please contact night-coverage at [email or web address], password [facility] 05/30/2012, 3:09 PM  LOS: 1 day

## 2012-05-30 NOTE — Progress Notes (Signed)
Pt cbg at 0620 was 66. Administered orange juice and recheck at 0655 and is 129. MD made aware and new orders given. Will continue to monitor.

## 2012-05-31 DIAGNOSIS — E46 Unspecified protein-calorie malnutrition: Secondary | ICD-10-CM

## 2012-05-31 LAB — GLUCOSE, CAPILLARY
Glucose-Capillary: 159 mg/dL — ABNORMAL HIGH (ref 70–99)
Glucose-Capillary: 170 mg/dL — ABNORMAL HIGH (ref 70–99)
Glucose-Capillary: 191 mg/dL — ABNORMAL HIGH (ref 70–99)
Glucose-Capillary: 205 mg/dL — ABNORMAL HIGH (ref 70–99)
Glucose-Capillary: 249 mg/dL — ABNORMAL HIGH (ref 70–99)
Glucose-Capillary: 265 mg/dL — ABNORMAL HIGH (ref 70–99)
Glucose-Capillary: 288 mg/dL — ABNORMAL HIGH (ref 70–99)
Glucose-Capillary: 326 mg/dL — ABNORMAL HIGH (ref 70–99)

## 2012-05-31 MED ORDER — INSULIN ASPART 100 UNIT/ML ~~LOC~~ SOLN
0.0000 [IU] | SUBCUTANEOUS | Status: DC
  Administered 2012-05-31: 7 [IU] via SUBCUTANEOUS
  Administered 2012-05-31: 2 [IU] via SUBCUTANEOUS
  Administered 2012-06-01: 1 [IU] via SUBCUTANEOUS

## 2012-05-31 MED ORDER — INSULIN GLARGINE 100 UNIT/ML ~~LOC~~ SOLN
5.0000 [IU] | Freq: Every day | SUBCUTANEOUS | Status: DC
  Administered 2012-05-31: 5 [IU] via SUBCUTANEOUS
  Filled 2012-05-31 (×2): qty 0.05

## 2012-05-31 NOTE — Progress Notes (Signed)
Inpatient Diabetes Program Recommendations  AACE/ADA: New Consensus Statement on Inpatient Glycemic Control (2013)  Target Ranges:  Prepandial:   less than 140 mg/dL      Peak postprandial:   less than 180 mg/dL (1-2 hours)      Critically ill patients:  140 - 180 mg/dL   Reason for Visit: Hyperglycemia  79 yo female admitted with hypoglycemia. PTA pt from SNF.   Hx IDDM - on Lantus 25 units QD and Novolog S/S  Results for Kristi, Snow (MRN [id number]) as of 05/31/2012 14:56  Ref. Range 05/31/2012 01:53 05/31/2012 03:58 05/31/2012 05:45 05/31/2012 07:44 05/31/2012 10:15 05/31/2012 11:42  Glucose-Capillary Latest Range: 70-99 mg/dL 413 (H) 244 (H) 010 (H) 159 (H) 242 (H) 232 (H)  Results for Kristi, Snow (MRN [id number]) as of 05/31/2012 14:56  Ref. Range 05/29/2012 08:15  Hemoglobin A1C Latest Range: <5.7 % 6.7 (H)    Inpatient Diabetes Program Recommendations Insulin - Basal: Increase Lantus to 12 units QHS and uptitrate (home dose 25 units QD) Insulin - Meal Coverage: Consider meal coverage insulin if po intake >50% - Novolog 3 units tidwc HgbA1C: 6.7% good control prior to hospitalization  Note: Will follow.  Thank you. Ailene Ards, RD, LDN, CDE Inpatient Diabetes Coordinator [phone]

## 2012-05-31 NOTE — Evaluation (Signed)
Clinical/Bedside Swallow Evaluation Patient Details  Name: Kristi Snow MRN: [id number] Date of Birth: 1924-10-11  Today's Date: 05/31/2012 Time: 7829-5621 SLP Time Calculation (min): 24 min  Past Medical History:  Past Medical History  Diagnosis Date  . Hypertension   . Diabetes mellitus   . Encephalopathy, unspecified   . Urinary tract infection   . Dementia   . Prolapsed uterus   . Lumbar spinal stenosis   . Crystal arthritis 07/09/2011   Past Surgical History: History reviewed. No pertinent past surgical history. HPI:  79 yo female adm to [facility] from SNF after being found lethargic.  Per dietician note, pt with weight loss of 100 pounds in 4-5 months, now requiring to be fed liquids via syringe per family statement.  Pt most recent CXR showed possible focal pna - right middle lung opacity.  Pt has dementia, HTN, UTI, hypoglycemia and PVD.  She resides at [facility] and SLP phoned SNF to establish baseline.  Per rehab staff, pt has not been seen by speech pathology for 2-3 months.  No family present currently.    Assessment / Plan / Recommendation Clinical Impression  Pt presents with cognitive based oral dysphagia likely due to cognitive deficits.  She was able to seal lips on cup and consume water, Ensure without severe difficulties.  Decreased oral control, manipulation noted as well as delayed swallow noted.  Presumed delayed oral transiting but may have delayed pharyngeal reflexive swallow as well.  Pt noted to grind her teeth continuously and did not follow commands.  Suspect decreased intake and progressive weight loss due to level of dysphagia with dementia that will be chronic and progressive.   Do not anticipate pt needs to use syringe currently as she was able to orally manage small single sips from the cup- and use of syringe may decrease amount of liquids pt is consuming. Pt did not form suction on straw however.  No family present, therefore will follow up to educate  family to dysphagia with dementia, progression expectation and compensatory strategies.   Thanks for this consult.      Aspiration Risk  Moderate    Diet Recommendation Dysphagia 1 (Puree);Thin liquid   Liquid Administration via: Cup;No straw Medication Administration: Crushed with puree Supervision: Staff feed patient Compensations: Slow rate;Small sips/bites (make sure pt swallows before giving more) Postural Changes and/or Swallow Maneuvers: Seated upright 90 degrees;Upright 30-60 min after meal    Other  Recommendations Oral Care Recommendations: Oral care before and after PO   Follow Up Recommendations       Frequency and Duration min 1 x/week  1 week   Pertinent Vitals/Pain Afebrile, decreased    SLP Swallow Goals Patient will utilize recommended strategies during swallow to increase swallowing safety with: Total assistance Goal #3: Pt's family will verbalize aspiration precautions, mitigation strategies and probable progression of dysphagia with dementia pts with min assist to help to decrease pt aspiration risk.    Swallow Study Prior Functional Status   pt at SNF, was not receiving SLP services at SNF    General HPI: 79 yo female adm to [facility] from SNF after being found lethargic.  Per dietician note, pt with weight loss of 100 pounds in 4-5 months, now requiring to be fed liquids via syringe per family statement.  Pt most recent CXR showed possible focal pna - right middle lung opacity.  Pt has dementia, HTN, UTI, hypoglycemia and PVD.  She resides at [facility] and SLP phoned SNF to establish baseline.  Per rehab staff, pt has not been seen by speech pathology for 2-3 months.  No family present currently.  Type of Study: Bedside swallow evaluation Diet Prior to this Study: Dysphagia 1 (puree);Thin liquids Respiratory Status: Room air History of Recent Intubation: No Behavior/Cognition: Alert;Decreased sustained attention;Doesn't follow directions (pt has  dementia) Oral Cavity - Dentition: Adequate natural dentition Self-Feeding Abilities: Total assist Patient Positioning: Upright in bed Baseline Vocal Quality: Low vocal intensity Volitional Cough: Cognitively unable to elicit Volitional Swallow: Unable to elicit    Oral/Motor/Sensory Function Overall Oral Motor/Sensory Function:  (generalized weakness, pt did not follow commands, no focal )   Ice Chips Ice chips: Not tested   Thin Liquid Thin Liquid: Impaired Presentation: Cup;Spoon;Straw Oral Phase Impairments: Impaired anterior to posterior transit;Reduced lingual movement/coordination Oral Phase Functional Implications: Prolonged oral transit Pharyngeal  Phase Impairments: Suspected delayed Swallow Other Comments: pt did not form suction on straw, able to take liquids from cup and spoon, did not test syringe    Nectar Thick Nectar Thick Liquid: Impaired Presentation: Spoon;Cup Oral Phase Impairments: Impaired anterior to posterior transit;Reduced lingual movement/coordination Oral phase functional implications: Prolonged oral transit Pharyngeal Phase Impairments: Suspected delayed Swallow   Honey Thick Honey Thick Liquid: Not tested   Puree Puree: Impaired Presentation: Spoon Oral Phase Impairments: Reduced lingual movement/coordination;Impaired anterior to posterior transit Oral Phase Functional Implications: Prolonged oral transit Pharyngeal Phase Impairments: Suspected delayed Swallow   Solid   GO    Solid: Not tested Other Comments: pt with teeth grinding, did not open mouth to accept solids and given institutionalized feeding, acute illness, rec continue pureed       Donavan Burnet, MS [facility] SLP [phone]

## 2012-05-31 NOTE — Progress Notes (Signed)
[facility] PROGRESS NOTE  Corri Delapaz NFA:[id number] DOB: 01-Nov-1924 DOA: 05/29/2012 PCP: Alva Garnet., MD  Assessment/Plan: Active Problems:   Dementia   Hypertension: Blood pressure stable. Continue home meds.    Hypoglycemia associated with diabetes: Likely secondary to acute illness. A1c noted to be 6.7, so low blood sugars are not new for patient periods combination of illness plus poor by mouth intake.  Not her UTI has been treated, her sugars are starting to increase. Restart sliding scale.   PVD (peripheral vascular disease): Noted. Stable.  Severe protein calorie malnutrition (as evidenced by chronic illness and 25.7% weight loss in the past 3 months with visible severe muscle wasting and subcutaneous fat loss): Appreciate dietitian consult. Will ask speech therapy to see. Only able to swallow liquids via plastics arranged.  UTI: Cultures pending. Mild. Of note have yeast, so added Diflucan.  Dysphasia: Awaiting speech therapy evaluation. I'm concerned the patient may need a feeding tube.  Code Status: Full code  Family Communication: Left message for sun.  Disposition Plan: Back to skilled nursing facility when CBG stabilized   Consultants:  None  Procedures:  None  Antibiotics:  IV Rocephin , day 3-stopped  IV Diflucan started 4/18  HPI/Subjective: Patient nonverbal. Unable to give me any subjective complaints  Objective: Filed Vitals:   05/30/12 0415 05/30/12 1336 05/30/12 2116 05/31/12 0535  BP: 143/64 139/67 115/66 130/69  Pulse: 51 87 106 78  Temp: 98.7 F (37.1 C) 98 F (36.7 C) 98.9 F (37.2 C) 98.3 F (36.8 C)  TempSrc: Axillary Axillary Axillary Oral  Resp: 15 15 16 18   Weight:      SpO2: 100% 99% 93% 100%    Intake/Output Summary (Last 24 hours) at 05/31/12 1319 Last data filed at 05/31/12 0900  Gross per 24 hour  Intake    480 ml  Output      0 ml  Net    480 ml   Filed Weights   05/29/12 0657  Weight: 44.5 kg (98  lb 1.7 oz)    Exam:   General:  Confused, nonverbal  Cardiovascular: Regular rate and rhythm, S1-S2  Respiratory: Poor inspiratory effort  Abdomen: Soft, non-distended, positive bowel sounds  Musculoskeletal: No clubbing or cyanosis or edema   Data Reviewed: Basic Metabolic Panel:  Recent Labs Lab 05/29/12 0415 05/29/12 0815 05/30/12 0355  NA 140 141 139  K 3.1* 3.2* 4.3  CL 103 105 105  CO2 27 30 21   GLUCOSE 125* 121* 103*  BUN 11 9 7   CREATININE 0.42* 0.43* 0.49*  CALCIUM 9.6 9.3 9.0   CBC:  Recent Labs Lab 05/29/12 0415 05/29/12 0815 05/30/12 0355  WBC 5.6 5.8 5.2  NEUTROABS 4.5  --   --   HGB 14.8 14.1 13.2  HCT 42.7 40.3 39.2  MCV 90.1 90.0 91.2  PLT 265 257 233   CBG:  Recent Labs Lab 05/31/12 0358 05/31/12 0545 05/31/12 0744 05/31/12 1015 05/31/12 1142  GLUCAP 170* 171* 159* 242* 232*      Studies: Dg Chest 1 View  05/30/2012 and IMPRESSION: PICC line repositioning with tip at the SVC right atrial level. Persistent right perihilar opacity, possibly atelectasis or infiltrate.  Again, follow-up is necessary to exclude a developing mass.   Original Report Authenticated By: Carey Bullocks, M.D.    Dg Chest Port 1 View  05/29/2012 IMPRESSION:  1.  Left arm PICC tip projects over the mid right atrium.  This should be withdrawn approximately 6 cm. 2.  Suboptimal inspiration.  Vague opacity in the right mid lung likely representing focal pneumonia.  Follow-up chest x-ray after treatment is suggested to be sure this resolves.  These results will be called to the ordering clinician or representative by the Radiologist Assistant, and communication documented in the PACS Dashboard.   Original Report Authenticated By: Hulan Saas, M.D.     Scheduled Meds: . amLODipine  10 mg Oral Daily  . antiseptic oral rinse  15 mL Mouth Rinse BID  . cilostazol  100 mg Oral BID  . dextrose  50 mL Intravenous Once  . donepezil  5 mg Oral Daily  . dronabinol   2.5 mg Oral QAC lunch  . enoxaparin (LOVENOX) injection  30 mg Subcutaneous Q24H  . feeding supplement  237 mL Oral BID BM  . feeding supplement  30 mL Oral TID WC  . fluconazole (DIFLUCAN) IV  100 mg Intravenous Q24H  . hydrALAZINE  10 mg Oral TID  . insulin aspart  0-9 Units Subcutaneous Q4H  . isosorbide mononitrate  20 mg Oral BID  . multivitamin  5 mL Oral Daily   Continuous Infusions:    Active Problems:   Dementia   Hypertension   Hypoglycemia associated with diabetes   UTI (lower urinary tract infection)   Protein calorie malnutrition   PVD (peripheral vascular disease)    Time spent: 20 minutes    Hollice Espy  [facility] Pager [phone]. If 7PM-7AM, please contact night-coverage at [email or web address], password [facility] 05/31/2012, 1:19 PM  LOS: 2 days

## 2012-06-01 LAB — GLUCOSE, CAPILLARY
Glucose-Capillary: 73 mg/dL (ref 70–99)
Glucose-Capillary: 84 mg/dL (ref 70–99)
Glucose-Capillary: 85 mg/dL (ref 70–99)

## 2012-06-01 MED ORDER — DRONABINOL 2.5 MG PO CAPS: 2.5000 mg | ORAL_CAPSULE | Freq: Every day | ORAL | Status: DC

## 2012-06-01 MED ORDER — ENSURE COMPLETE PO LIQD: 237.0000 mL | Freq: Two times a day (BID) | ORAL | Status: AC

## 2012-06-01 NOTE — Progress Notes (Signed)
CSW assisting with d/c planning. Met with pt / son at bedside this am. Pt's contacted [facility] yesterday and spoke with admissions regarding placement . He was told there would be an opening for his mother today. CSW has sent clinical info to SNF for review. Admissions will contact CSW once a decision is made. CSW contacted pt's cousin, Huntley Dec, at son's request. Huntley Dec would like to be here prior to SNF transfer . Message left for Huntley Dec to contact CSW. CSW will continue to follow to assist with d/c planning to SNF.  Cori Razor LCSW [phone]

## 2012-06-01 NOTE — Discharge Summary (Addendum)
Physician Discharge Summary  Kristi Snow NWG:[id number] DOB: 1924/05/19 DOA: 05/29/2012  PCP: Alva Garnet., MD  Admit date: 05/29/2012 Discharge date: 06/01/2012  Time spent: 30 minutes  Recommendations for Outpatient Follow-up:  1. Patient's Lantus is being discontinued altogether at this time. Recommending that should she have CBGs greater than 200 consistently x48 hours, restart Lantus at 5 units each bedtime and titrate up. This can be managed as per primary care physician at the skilled nursing facility  Discharge Diagnoses:  Active Problems:   Dementia   Hypertension   Hypoglycemia associated with diabetes   UTI (lower urinary tract infection)   Protein calorie malnutrition   PVD (peripheral vascular disease)   Discharge Condition: Improved, being discharged back to skilled nursing facility  Diet recommendation: Dysphasia 1 with thin liquids, no straws, aspiration precautions  Filed Weights   05/29/12 0657  Weight: 44.5 kg (98 lb 1.7 oz)    History of present illness:  On 4/15:Kristi Snow is a 79 y.o. female was brought from the nursing home after patient was found lethargic and CBG checks was around 29. Patient initially was given D50 twice and blood sugar improved but gradually declined again and at this time patient has been started on D5 IV infusion had been for further observation. Patient has advanced dementia and does not contribute anything to the history. Patient otherwise is not in acute distress.  Hospital Course:  Active Problems:   Dementia: Stable medical issue.    Hypertension: Stable. Restarted on home medications.    Hypoglycemia associated with diabetes: Contributing factor was likely UTI. Initially medications all on hold. Patient required dextrose in IV fluids to keep her CBGs elevated. Following treatment of her urinary tract infection, sugar started to increase. Dextrose in fluids were stopped and Lantus 5 units each bedtime was given on the  night of 4/17. On morning of 4/18, CBG is borderline. Recommending that patient be discharged on sliding scale only. Instructions are that if patient has consisted CBGs greater than 200x48 hours, restart Lantus at 5 units each bedtime and slowly titrate up. This can be managed by supervising physician at nursing facility.    UTI (lower urinary tract infection): Patient given 3 days of IV Rocephin. Urine also grew yeast and patient started on Diflucan. Following administration of these medications, hypoglycemia resolved and she became more alert. Unfortunately given her dementia, she'll likely be prone to further UTIs in the future without indication until it is quite advanced.    Severe Protein calorie malnutrition (as evidenced by chronic illness and 25.7% weight loss in the past 3 months with visible severe muscle wasting and subcutaneous fat loss): On feeding supplement.    PVD (peripheral vascular disease): Continue on Pletal  Dysphagia: Evaluated by speech therapy. Patient was able, once her alertness improved after treating her tract infection to pass swallow evaluation which recommended dysphagia 1 diet with thin liquids and full aspiration precautions.  Procedures:  None  Consultations:  None  Discharge Exam: Filed Vitals:   05/31/12 0535 05/31/12 1400 06/01/12 0007 06/01/12 0615  BP: 130/69 120/71 124/63 121/62  Pulse: 78 110 91 82  Temp: 98.3 F (36.8 C) 98.8 F (37.1 C) 98.1 F (36.7 C) 98.3 F (36.8 C)  TempSrc: Oral Axillary Axillary Axillary  Resp: 18 18 16 16   Weight:      SpO2: 100% 100% 100% 99%    General: Chronic dementia, nonverbal Cardiovascular: Regular rate and rhythm, S1-S2 Respiratory: Clear to auscultation bilaterally  Discharge Instructions  Discharge  Orders   Future Orders Complete By Expires     DIET - DYS 1  As directed     Comments:      Thin liquids, meds crushed in puree, sit upright, full supervision, no straws    Increase activity slowly   As directed         Medication List    STOP taking these medications       insulin glargine 100 UNIT/ML injection  Commonly known as:  LANTUS     megestrol 40 MG/ML suspension  Commonly known as:  MEGACE      TAKE these medications       amLODipine 10 MG tablet  Commonly known as:  NORVASC  Take 1 tablet (10 mg total) by mouth daily.     cilostazol 100 MG tablet  Commonly known as:  PLETAL  Take 100 mg by mouth 2 (two) times daily.     diphenhydrAMINE 25 MG tablet  Commonly known as:  SOMINEX  Take 25 mg by mouth 2 (two) times daily.     donepezil 5 MG tablet  Commonly known as:  ARICEPT  Take 5 mg by mouth daily.     dronabinol 2.5 MG capsule  Commonly known as:  MARINOL  Take 1 capsule (2.5 mg total) by mouth daily before lunch.     feeding supplement Liqd  Take 237 mLs by mouth 2 (two) times daily between meals.     hydrALAZINE 10 MG tablet  Commonly known as:  APRESOLINE  Take 1 tablet (10 mg total) by mouth 3 (three) times daily.     insulin aspart 100 UNIT/ML injection  Commonly known as:  novoLOG  Inject 0-10 Units into the skin 4 (four) times daily - after meals and at bedtime. CBG 70-120: 0 u; CBG 121-150: 3 u; CBG 151-200: 4 u; CBG 201-250: 7 u; CBG 251-300: 11 u;CBG 301-350: 15 u; CBG 351-400: 20 u; CBG > 400: call MD     isosorbide mononitrate 20 MG tablet  Commonly known as:  ISMO,MONOKET  Take 20 mg by mouth 2 (two) times daily.     multivitamin tablet  Take 1 tablet by mouth daily.     vitamin C 500 MG tablet  Commonly known as:  ASCORBIC ACID  Take 500 mg by mouth daily.           Follow-up Information   Follow up with Alva Garnet., MD In 1 month.   Contact information:   [address] [place] [address] [phone]        The results of significant diagnostics from this hospitalization (including imaging, microbiology, ancillary and laboratory) are listed below for reference.    Significant Diagnostic  Studies: Dg Chest 1 View  05/30/2012    IMPRESSION: PICC line repositioning with tip at the SVC right atrial level. Persistent right perihilar opacity, possibly atelectasis or infiltrate.  Again, follow-up is necessary to exclude a developing mass.   Original Report Authenticated By: Carey Bullocks, M.D.    Dg Chest Port 1 View  05/29/2012    IMPRESSION:  1.  Left arm PICC tip projects over the mid right atrium.  This should be withdrawn approximately 6 cm. 2.  Suboptimal inspiration.  Vague opacity in the right mid lung likely representing focal pneumonia.  Follow-up chest x-ray after treatment is suggested to be sure this resolves.  These results will be called to the ordering clinician or representative by the Radiologist Assistant, and communication  documented in the PACS Dashboard.   Original Report Authenticated By: Hulan Saas, M.D.      Labs: Basic Metabolic Panel:  Recent Labs Lab 05/29/12 0415 05/29/12 0815 05/30/12 0355  NA 140 141 139  K 3.1* 3.2* 4.3  CL 103 105 105  CO2 27 30 21   GLUCOSE 125* 121* 103*  BUN 11 9 7   CREATININE 0.42* 0.43* 0.49*  CALCIUM 9.6 9.3 9.0   CBC:  Recent Labs Lab 05/29/12 0415 05/29/12 0815 05/30/12 0355  WBC 5.6 5.8 5.2  NEUTROABS 4.5  --   --   HGB 14.8 14.1 13.2  HCT 42.7 40.3 39.2  MCV 90.1 90.0 91.2  PLT 265 257 233   CBG:  Recent Labs Lab 05/31/12 1822 05/31/12 2015 06/01/12 0005 06/01/12 0407 06/01/12 0751  GLUCAP 326* 191* 85 84 73       Signed:  Wrenn Willcox K  [facility] 06/01/2012, 10:05 AM

## 2012-06-01 NOTE — Progress Notes (Signed)
Pt D/C to [facility], Pt is alert and oriented no new complains, denies pain. Pt unable to comprehend D/C teaching and Instructions. D/C report will be called in to Receiving RN .

## 2012-06-01 NOTE — Progress Notes (Signed)
Pt to be d/c to [facility] this afternoon. [name], Miss Huntley Dec, is at bedside. Pt's son has been contacted and d/c plans reviewed. EMS has been called to arrange transport. EMS has received a high volume of transport requests so a delay is expected. Pt / family / nsg / SNF have been informed.  Cori Razor  LCSW [phone]

## 2012-06-02 ENCOUNTER — Encounter (HOSPITAL_COMMUNITY): Payer: Self-pay | Admitting: Emergency Medicine

## 2012-06-04 ENCOUNTER — Non-Acute Institutional Stay (SKILLED_NURSING_FACILITY): Payer: Medicare Other | Admitting: Internal Medicine

## 2012-06-04 DIAGNOSIS — F0390 Unspecified dementia without behavioral disturbance: Secondary | ICD-10-CM

## 2012-06-04 DIAGNOSIS — I10 Essential (primary) hypertension: Secondary | ICD-10-CM

## 2012-06-04 DIAGNOSIS — E119 Type 2 diabetes mellitus without complications: Secondary | ICD-10-CM

## 2012-06-04 DIAGNOSIS — E43 Unspecified severe protein-calorie malnutrition: Secondary | ICD-10-CM

## 2012-06-10 ENCOUNTER — Non-Acute Institutional Stay (SKILLED_NURSING_FACILITY): Payer: Medicare Other | Admitting: Internal Medicine

## 2012-06-10 DIAGNOSIS — C50212 Malignant neoplasm of upper-inner quadrant of left female breast: Secondary | ICD-10-CM

## 2012-06-10 DIAGNOSIS — C50219 Malignant neoplasm of upper-inner quadrant of unspecified female breast: Secondary | ICD-10-CM

## 2012-06-10 NOTE — Progress Notes (Signed)
Patient ID: Kristi Snow, female   DOB: 23-May-1924, 79 y.o.   MRN: [id number] Chief complaint; followup of medical issues.   History; this is a patient who was admitted here last week after a stay at [facility] related to severe hypoglycemia. She came here only on a sliding scale, recommendations for Lantus 5 units if blood sugars are consistently greater than 200. It appears that her blood sugars have been in the 150-200 range. A.c. breakfast and over 200 or even 300, later in the day. sHe apparently is eating well, for at least a full breakfast.  Past medical history is reviewed; she has advanced dementia and a history of severe protein calorie malnutrition. Yet she remains a full code. She was seen by speech therapy in the hospital and recommended a dysphagia 1 diet with thin liquids.   Medications; list is reviewed.   Review of systems; not really possible. She had her breakfast this morning.   Physical exam; Gen. patient is lying in bed restless. Grinding her teeth.  Respiratory shallow air entry bilateral  Cardiac heart sounds are normal, she does not appear to be grossly dehydrated. Breast; there is a huge mass occupying most of the superior medial quadrant of the left breast. There is a large coledome in the medial aspect of this mass. There is no left axillary, infra-or superior clavicular, or cervical adenopathy. Abdomen no liver no spleen no tenderness.  Impression/plan; #1 type 2 diabetes on insulin; I will add back a small dose of Lantus.  #2 advanced dementia; I note full CODE STATUS, which for many reasons is not really medically appropriate.  #3; large breast mass, which is probably malignant. Will see if we know anything about this although I could see nothing on [place] link.

## 2012-06-18 NOTE — Progress Notes (Signed)
Patient ID: Kristi Snow, female   DOB: [birth year], 79 y.o.   MRN: [id number]        HISTORY & PHYSICAL  DATE:  06/04/2012  FACILITY: [facility]   LEVEL OF CARE: SNF   ALLERGIES:  No Known Allergies  CHIEF COMPLAINT:  Manage dementia, hypertension and diabetes mellitus.    HISTORY OF PRESENT ILLNESS:  79 year-old, African-American female was hospitalized secondary to severe hypoglycemia.  After her hospitalization, she is admitted to this facility for long-term care management.  She has the following problems:  DEMENTIA: The dementia remains stable and continues to function adequately in the current living environment with supervision.  The patient has had little changes in behavior. No complications noted from the medications presently being used.  Patient is a poor historian.    HTN: Pt 's HTN remains stable.  Staff denies CP, sob, DOE, pedal edema, headaches, dizziness or visual disturbances.  No complications from the medications currently being used.  Last BP :  118/64.  DM:pt's DM remains stable.  Staff denies polyuria, polydipsia, polyphagia, changes in vision or hypoglycemic episodes.  No complications noted from the medication presently being used.  Last hemoglobin A1c is: A recent  hemoglobin A1C is not available.    PAST MEDICAL HISTORY : (per record) Past Medical History  Diagnosis Date  . Hypertension   . Diabetes mellitus   . Encephalopathy, unspecified   . Urinary tract infection   . Dementia   . Prolapsed uterus   . Lumbar spinal stenosis   . Crystal arthritis 07/09/2011    PAST SURGICAL HISTORY:unobtainable due to dementia  SOCIAL HISTORY: (per record)  reports that she quit smoking about 45 years ago. She has never used smokeless tobacco. She reports that she does not drink alcohol or use illicit drugs.  FAMILY HISTORY: unobtainable due to dementia   CURRENT MEDICATIONS: Reviewed per [facility]  REVIEW OF SYSTEMS:  Unobtainable due to dementia.    PHYSICAL  EXAMINATION  VS:  T 99.2     P  94     RR 18      BP  118/64     POX%        WT (Lb)  GENERAL: no acute distress, thin body habitus EYES: conjunctivae normal, sclerae normal, normal eye lids MOUTH/THROAT: unable to assess  NECK: supple, trachea midline, no neck masses, no thyroid tenderness, no thyromegaly LYMPHATICS: no LAN in the neck, no supraclavicular LAN RESPIRATORY: breathing is even & unlabored, BS CTAB CARDIAC: RRR, no murmur,no extra heart sounds, no edema GI:  ABDOMEN: abdomen soft, normal BS, no masses, no tenderness  LIVER/SPLEEN: no hepatomegaly, no splenomegaly MUSCULOSKELETAL:  unable to assess  PSYCHIATRIC: the patient is minimally alert, disoriented, decreased affect and mood    LABS/RADIOLOGY: Chest x-ray:  No acute disease.    Glucose 103, otherwise BMP normal.    CBC normal.    ASSESSMENT/PLAN:  Diabetes mellitus.  Continue current medications.    Hypertension.  Well controlled.    Dementia.  Advanced.   Severe protein calorie malnutrition.  Continue nutritional supplements.  Patient is now on Marinol, as well.    Peripheral vascular disease.  Continue Pletal.    Dysphagia.  Recommended dysphagia I diet with thin liquid and full aspiration precautions.    Check CBC and BMP.   I have reviewed patient's medical records received at admission/from hospitalization.  CPT CODE: 40981

## 2012-06-25 ENCOUNTER — Non-Acute Institutional Stay (SKILLED_NURSING_FACILITY): Payer: Medicare Other | Admitting: Internal Medicine

## 2012-06-25 DIAGNOSIS — G309 Alzheimer's disease, unspecified: Secondary | ICD-10-CM

## 2012-06-25 DIAGNOSIS — E1159 Type 2 diabetes mellitus with other circulatory complications: Secondary | ICD-10-CM

## 2012-06-25 DIAGNOSIS — F0280 Dementia in other diseases classified elsewhere without behavioral disturbance: Secondary | ICD-10-CM

## 2012-06-25 DIAGNOSIS — I10 Essential (primary) hypertension: Secondary | ICD-10-CM

## 2012-06-25 DIAGNOSIS — R627 Adult failure to thrive: Secondary | ICD-10-CM

## 2012-06-26 DIAGNOSIS — R627 Adult failure to thrive: Secondary | ICD-10-CM | POA: Insufficient documentation

## 2012-06-26 DIAGNOSIS — E1159 Type 2 diabetes mellitus with other circulatory complications: Secondary | ICD-10-CM | POA: Insufficient documentation

## 2012-06-26 DIAGNOSIS — I10 Essential (primary) hypertension: Secondary | ICD-10-CM | POA: Insufficient documentation

## 2012-06-26 DIAGNOSIS — G309 Alzheimer's disease, unspecified: Secondary | ICD-10-CM | POA: Insufficient documentation

## 2012-06-26 NOTE — Progress Notes (Signed)
PROGRESS NOTE  DATE: 06/25/2012  FACILITY: Nursing Home Location: [facility]  LEVEL OF CARE: SNF (31)  Routine Visit  CHIEF COMPLAINT:  Manage dementia, diabetes mellitus and hypertension  HISTORY OF PRESENT ILLNESS:  REASSESSMENT OF ONGOING PROBLEM(S):  1. HTN: Pt 's HTN remains stable.  Staff Deny CP, sob, DOE, pedal edema, headaches, dizziness or visual disturbances.  No complications from the medications currently being used.  Last BP : 140/86.  2. DM:pt's DM remains stable.  Staff deny polyuria, polydipsia, polyphagia, changes in vision or hypoglycemic episodes.  No complications noted from the medication presently being used.  Last hemoglobin A1c is: None available.  3. DEMENTIA: The dementia remaines stable and continues to function adequately in the current living environment with supervision.  The patient has had little changes in behavior. No complications noted from the medications presently being used. Patient is a poor historian.  PAST MEDICAL HISTORY : Reviewed.  No changes.  CURRENT MEDICATIONS: Reviewed per [facility]  REVIEW OF SYSTEMS: Unobtainable secondary to dementia.  PHYSICAL EXAMINATION  VS:  T 97.3       P 66      RR 18      BP     POX %     WT (Lb) 100  GENERAL: no acute distress, thin body habitus EYES: conjunctivae normal, sclerae normal, normal eye lids NECK: supple, trachea midline, no neck masses, no thyroid tenderness, no thyromegaly LYMPHATICS: no LAN in the neck, no supraclavicular LAN RESPIRATORY: breathing is even & unlabored, BS CTAB CARDIAC: RRR, no murmur,no extra heart sounds, no edema GI: abdomen soft, normal BS, no masses, no tenderness, no hepatomegaly, no splenomegaly PSYCHIATRIC: the patient is alert & disoriented, affect & behavior appropriate  LABS/RADIOLOGY:  4/14 CBC normal, glucose 183 otherwise BMP normal  ASSESSMENT/PLAN:  1. Alzheimer's dementia-advanced. Check vitamin B12 level. 2. Diabetes  mellitus-Lantus was started. Check hemoglobin A1c. 3. hypertension-blood pressure borderline. We'll monitor. 4. failure to thrive-currently on marinol. Check TSH and liver profile. 5. PVD-no acute issues. 6. protein calorie malnutrition-continue dietary supplements. 7. dysphagia-continue a dysphagia 1 diet with thin liquids and full aspiration precautions.  CPT CODE: 84696

## 2012-07-02 ENCOUNTER — Other Ambulatory Visit: Payer: Self-pay | Admitting: *Deleted

## 2012-07-02 ENCOUNTER — Non-Acute Institutional Stay (SKILLED_NURSING_FACILITY): Payer: Medicare Other | Admitting: Internal Medicine

## 2012-07-02 DIAGNOSIS — E1159 Type 2 diabetes mellitus with other circulatory complications: Secondary | ICD-10-CM

## 2012-07-02 MED ORDER — DRONABINOL 2.5 MG PO CAPS: ORAL_CAPSULE | ORAL | Status: DC

## 2012-07-19 NOTE — Progress Notes (Signed)
Patient ID: Kristi Snow, female   DOB: 1924/02/25, 79 y.o.   MRN: [id number]        PROGRESS NOTE  DATE: 07/02/2012  FACILITY:  [facility]  LEVEL OF CARE: SNF (31)  Acute Visit  CHIEF COMPLAINT:  Manage diabetes mellitus.    HISTORY OF PRESENT ILLNESS: I was requested by the staff to assess the patient regarding above problem(s):  DM:pt's DM is unstable.  Staff denies polyuria, polydipsia, polyphagia, changes in vision or hypoglycemic episodes.  No complications noted from the medication presently being used.  Last hemoglobin A1c is: On 06/27/2012,  hemoglobin A1C 7.3.  Patient is a poor historian.    PAST MEDICAL HISTORY : Reviewed.  No changes.  CURRENT MEDICATIONS: Reviewed per [facility]  REVIEW OF SYSTEMS:  Unobtainable due to dementia.   PHYSICAL EXAMINATION  GENERAL: no acute distress, normal body habitus RESPIRATORY: breathing is even & unlabored, BS CTAB CARDIAC: RRR, no murmur,no extra heart sounds, no edema GI: abdomen soft, normal BS, no masses, no tenderness, no hepatomegaly, no splenomegaly PSYCHIATRIC: the patient is alert, disoriented, affect & behavior appropriate  ASSESSMENT/PLAN:  Diabetes mellitus with vascular complications.  Uncontrolled.  Increase Lantus to 8 U q.h.s.   CPT CODE: 30865

## 2012-07-23 ENCOUNTER — Non-Acute Institutional Stay (SKILLED_NURSING_FACILITY): Payer: Medicare Other | Admitting: Internal Medicine

## 2012-07-23 DIAGNOSIS — F0280 Dementia in other diseases classified elsewhere without behavioral disturbance: Secondary | ICD-10-CM

## 2012-07-23 DIAGNOSIS — R627 Adult failure to thrive: Secondary | ICD-10-CM

## 2012-07-23 DIAGNOSIS — E1159 Type 2 diabetes mellitus with other circulatory complications: Secondary | ICD-10-CM

## 2012-07-23 DIAGNOSIS — G309 Alzheimer's disease, unspecified: Secondary | ICD-10-CM

## 2012-07-23 DIAGNOSIS — I10 Essential (primary) hypertension: Secondary | ICD-10-CM

## 2012-07-24 NOTE — Progress Notes (Signed)
PROGRESS NOTE  DATE: 07-23-12  FACILITY: Nursing Home Location: [facility]  LEVEL OF CARE: SNF (31)  Routine Visit  CHIEF COMPLAINT:  Manage failure to thrive, dementia, diabetes mellitus and hypertension  HISTORY OF PRESENT ILLNESS:  REASSESSMENT OF ONGOING PROBLEM(S):  FTT: Patient is currently on Marinol and tolerates it without any problems. April weight is 99 and in May weight dropped from 100 to 95. In June weight is 101. In 5/14 TSH 0.94.   HTN: Pt 's HTN remains stable.  Staff Deny CP, sob, DOE, pedal edema, headaches, dizziness or visual disturbances.  No complications from the medications currently being used.  Last BP : 120/68, 140/86.  DM:pt's DM remains stable.  Staff deny polyuria, polydipsia, polyphagia, changes in vision or hypoglycemic episodes.  No complications noted from the medication presently being used.  Last hemoglobin A1c is: None available.   DEMENTIA: The dementia remaines stable and continues to function adequately in the current living environment with supervision.  The patient has had little changes in behavior. No complications noted from the medications presently being used. Patient is a poor historian.   PAST MEDICAL HISTORY : Reviewed.  No changes.  CURRENT MEDICATIONS: Reviewed per [facility]  REVIEW OF SYSTEMS: Unobtainable secondary to dementia.  PHYSICAL EXAMINATION  VS:  T 97.8       P 70      RR 18      BP 120/68     POX %     WT (Lb) 101  GENERAL: no acute distress, thin body habitus EYES: conjunctivae normal, sclerae normal, normal eye lids NECK: supple, trachea midline, no neck masses, no thyroid tenderness, no thyromegaly LYMPHATICS: no LAN in the neck, no supraclavicular LAN RESPIRATORY: breathing is even & unlabored, BS CTAB CARDIAC: RRR, no murmur,no extra heart sounds, no edema GI: abdomen soft, normal BS, no masses, no tenderness, no hepatomegaly, no splenomegaly PSYCHIATRIC: the patient is alert & disoriented,  affect & behavior appropriate  LABS/RADIOLOGY:  In 5/14 liver profile normal, hemoglobin A1c 7.3, vitamin B12 level 687, folate 13.6  4/14 CBC normal, glucose 183 otherwise BMP normal  ASSESSMENT/PLAN:  1. Alzheimer's dementia-advanced.  2. Diabetes mellitus-Lantus was started. 3. hypertension-well controlled. 4. failure to thrive-currently on marinol. Weight is relatively stable. 5. PVD-no acute issues. 6. protein calorie malnutrition-continue dietary supplements. 7. dysphagia-continue a dysphagia 1 diet with thin liquids and full aspiration precautions.  CPT CODE: [address]

## 2012-08-02 ENCOUNTER — Non-Acute Institutional Stay (SKILLED_NURSING_FACILITY): Payer: Medicare Other | Admitting: Adult Health

## 2012-08-02 DIAGNOSIS — I739 Peripheral vascular disease, unspecified: Secondary | ICD-10-CM

## 2012-08-02 DIAGNOSIS — E1159 Type 2 diabetes mellitus with other circulatory complications: Secondary | ICD-10-CM

## 2012-08-02 DIAGNOSIS — I10 Essential (primary) hypertension: Secondary | ICD-10-CM

## 2012-08-02 DIAGNOSIS — G309 Alzheimer's disease, unspecified: Secondary | ICD-10-CM

## 2012-08-02 DIAGNOSIS — F0280 Dementia in other diseases classified elsewhere without behavioral disturbance: Secondary | ICD-10-CM

## 2012-08-02 DIAGNOSIS — R627 Adult failure to thrive: Secondary | ICD-10-CM

## 2012-08-27 ENCOUNTER — Non-Acute Institutional Stay (SKILLED_NURSING_FACILITY): Payer: Medicare Other | Admitting: Internal Medicine

## 2012-08-27 DIAGNOSIS — F0280 Dementia in other diseases classified elsewhere without behavioral disturbance: Secondary | ICD-10-CM

## 2012-08-27 DIAGNOSIS — G309 Alzheimer's disease, unspecified: Secondary | ICD-10-CM

## 2012-08-27 DIAGNOSIS — R627 Adult failure to thrive: Secondary | ICD-10-CM

## 2012-08-27 DIAGNOSIS — I10 Essential (primary) hypertension: Secondary | ICD-10-CM

## 2012-08-27 DIAGNOSIS — E1159 Type 2 diabetes mellitus with other circulatory complications: Secondary | ICD-10-CM

## 2012-08-27 NOTE — Progress Notes (Signed)
PROGRESS NOTE  DATE: 08-27-12  FACILITY: Nursing Home Location: [facility]  LEVEL OF CARE: SNF (31)  Routine Visit  CHIEF COMPLAINT:  Manage dementia, diabetes mellitus and hypertension  HISTORY OF PRESENT ILLNESS:  REASSESSMENT OF ONGOING PROBLEM(S):  HTN: Pt 's HTN remains stable.  Staff Deny CP, sob, DOE, pedal edema, headaches, dizziness or visual disturbances.  No complications from the medications currently being used.  Last BP : 112/77, 120/68, 140/86.  DM:pt's DM remains stable.  Staff deny polyuria, polydipsia, polyphagia, changes in vision or hypoglycemic episodes.  No complications noted from the medication presently being used.  Last hemoglobin A1c is: None available.  DEMENTIA: The dementia remaines stable and continues to function adequately in the current living environment with supervision.  The patient has had little changes in behavior. No complications noted from the medications presently being used. Patient is a poor historian.   PAST MEDICAL HISTORY : Reviewed.  No changes.  CURRENT MEDICATIONS: Reviewed per [facility]  REVIEW OF SYSTEMS: Unobtainable secondary to dementia.  PHYSICAL EXAMINATION  VS:  T 97.2       P 90      RR 19      BP 112/77     POX %     WT (Lb) 101  GENERAL: no acute distress, thin body habitus NECK: supple, trachea midline, no neck masses, no thyroid tenderness, no thyromegaly RESPIRATORY: breathing is even & unlabored, BS CTAB CARDIAC: RRR, no murmur,no extra heart sounds, no edema GI: abdomen soft, normal BS, no masses, no tenderness, no hepatomegaly, no splenomegaly PSYCHIATRIC: the patient is alert & disoriented, affect & behavior appropriate  LABS/RADIOLOGY:  5/14 TSH 0.947  In 5/14 liver profile normal, hemoglobin A1c 7.3, vitamin B12 level 687, folate 13.6  4/14 CBC normal, glucose 183 otherwise BMP normal  ASSESSMENT/PLAN:  1. Alzheimer's dementia-advanced.  2. Diabetes mellitus-Lantus was  decreased. 3. hypertension-well controlled. 4. failure to thrive-currently on marinol. Weight is relatively stable. 5. PVD-no acute issues. 6. protein calorie malnutrition-continue dietary supplements. 7. dysphagia-continue a dysphagia 1 diet with thin liquids and full aspiration precautions.  CPT CODE: [address]

## 2012-09-17 ENCOUNTER — Non-Acute Institutional Stay (SKILLED_NURSING_FACILITY): Payer: Medicare Other | Admitting: Internal Medicine

## 2012-09-17 DIAGNOSIS — F0280 Dementia in other diseases classified elsewhere without behavioral disturbance: Secondary | ICD-10-CM

## 2012-09-17 DIAGNOSIS — G309 Alzheimer's disease, unspecified: Secondary | ICD-10-CM

## 2012-09-17 DIAGNOSIS — E1159 Type 2 diabetes mellitus with other circulatory complications: Secondary | ICD-10-CM

## 2012-09-17 DIAGNOSIS — I10 Essential (primary) hypertension: Secondary | ICD-10-CM

## 2012-09-17 DIAGNOSIS — R627 Adult failure to thrive: Secondary | ICD-10-CM

## 2012-09-21 NOTE — Progress Notes (Signed)
PROGRESS NOTE  DATE: 09-17-12  FACILITY: Nursing Home Location: [facility]  LEVEL OF CARE: SNF (31)  Routine Visit  CHIEF COMPLAINT:  Manage dementia, diabetes mellitus and hypertension  HISTORY OF PRESENT ILLNESS:  REASSESSMENT OF ONGOING PROBLEM(S):  HTN: Pt 's HTN remains stable.  Staff Deny CP, sob, DOE, pedal edema, headaches, dizziness or visual disturbances.  No complications from the medications currently being used.  Last BP : 112/77, 120/68, 140/86, 122/80.  DM:pt's DM remains stable.  Staff deny polyuria, polydipsia, polyphagia, changes in vision or hypoglycemic episodes.  No complications noted from the medication presently being used.  Last hemoglobin A1c is: 7.3 in 5/14  DEMENTIA: The dementia remaines stable and continues to function adequately in the current living environment with supervision.  The patient has had little changes in behavior. No complications noted from the medications presently being used. Patient is a poor historian.  PAST MEDICAL HISTORY : Reviewed.  No changes.  CURRENT MEDICATIONS: Reviewed per [facility]  REVIEW OF SYSTEMS: Unobtainable secondary to dementia.  PHYSICAL EXAMINATION  VS:  T 97       P 88      RR 18      BP 122/80    POX %     WT (Lb) 106  GENERAL: no acute distress, thin body habitus NECK: supple, trachea midline, no neck masses, no thyroid tenderness, no thyromegaly RESPIRATORY: breathing is even & unlabored, BS CTAB CARDIAC: RRR, no murmur,no extra heart sounds, no edema GI: abdomen soft, normal BS, no masses, no tenderness, no hepatomegaly, no splenomegaly PSYCHIATRIC: the patient is alert & disoriented, affect & behavior appropriate  LABS/RADIOLOGY:  5/14 TSH 0.947  In 5/14 liver profile normal, hemoglobin A1c 7.3, vitamin B12 level 687, folate 13.6  4/14 CBC normal, glucose 183 otherwise BMP normal  ASSESSMENT/PLAN:  Alzheimer's dementia-advanced.  Diabetes mellitus-Lantus was  decreased. hypertension-well controlled. failure to thrive-currently on marinol. Weight is increased. PVD-no acute issues. protein calorie malnutrition-continue dietary supplements. dysphagia-continue a dysphagia 1 diet with thin liquids and full aspiration precautions. Posterior neck abscess-currently on keflex.  CPT CODE: [address]

## 2012-09-26 ENCOUNTER — Non-Acute Institutional Stay (SKILLED_NURSING_FACILITY): Payer: Medicare Other | Admitting: Internal Medicine

## 2012-09-26 DIAGNOSIS — F0390 Unspecified dementia without behavioral disturbance: Secondary | ICD-10-CM

## 2012-09-28 IMAGING — CT CT HEAD W/O CM
4 of 7 series · 16 of 47 positions shown, 17 images · non-contrast
Comparison: CT head from 06/12/2011

CT HEAD

CLINICAL DATA: Dimension with fall.

CT HEAD WITHOUT CONTRAST
CT CERVICAL SPINE WITHOUT CONTRAST
TECHNIQUE: Multidetector CT imaging of the head and cervical spine
was performed following the standard protocol without intravenous
contrast.  Multiplanar CT image reconstructions of the cervical
spine were also generated.

[Series 4: head trauma 4.8 h37s · axial · 0.43mm/px · z∈[-158,-110]mm · 2 of 30 slices shown, 3 images]
[im 10/30  brain]
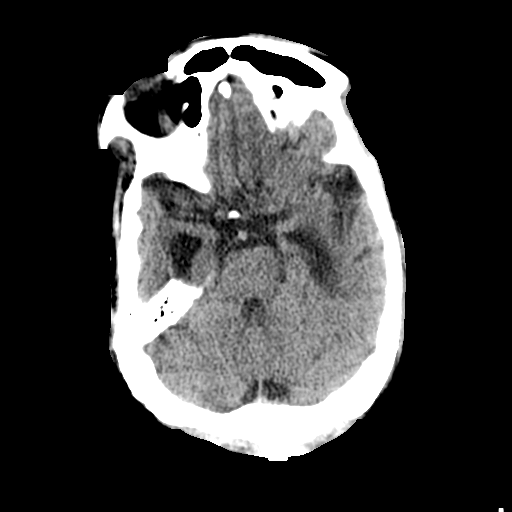
[im 10/30  bone]
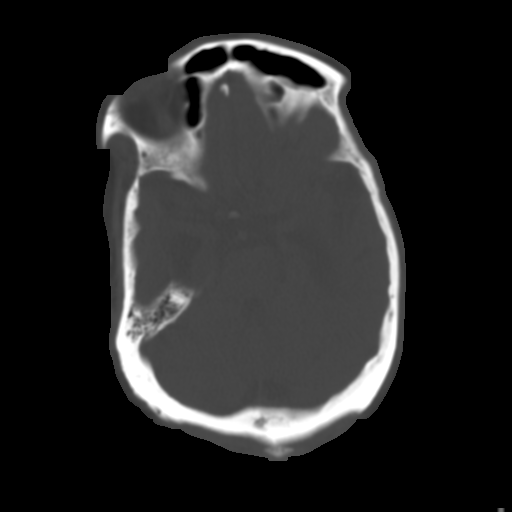
[im 20/30  brain]
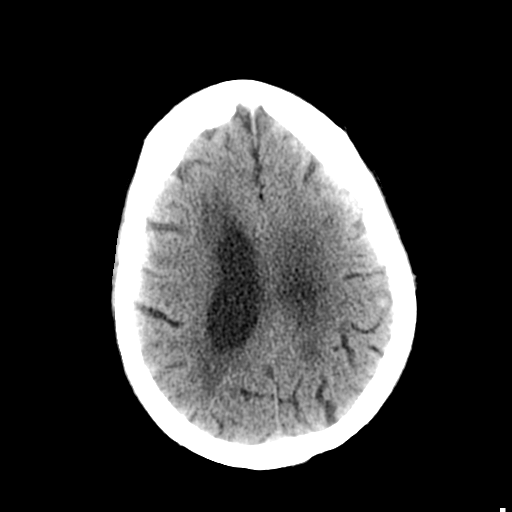

[Series 11: coronals · coronal · 0.32mm/px · 3 of 43 slices shown]
[im 15/43  brain]
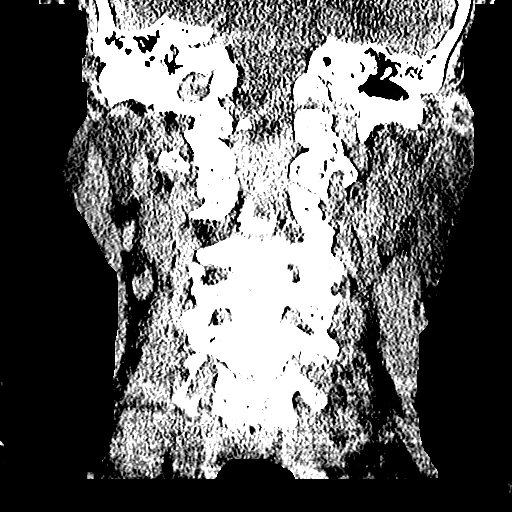
[im 19/43  brain]
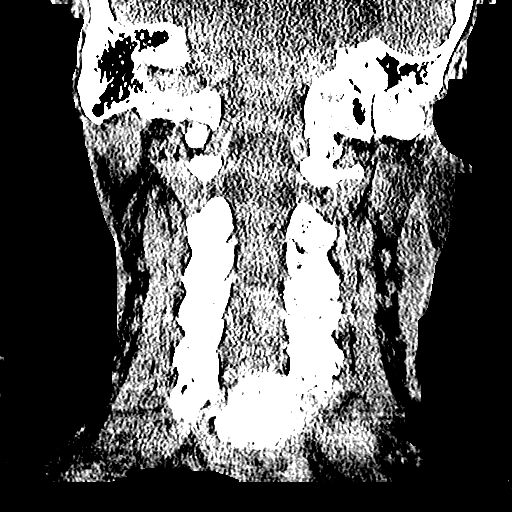
[im 24/43  brain]
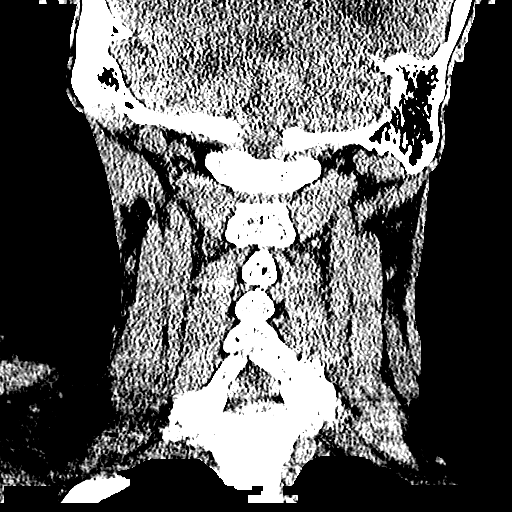

[Series 12: sagittals · sagittal · 0.24mm/px · 3 of 57 slices shown]
[im 19/57  brain]
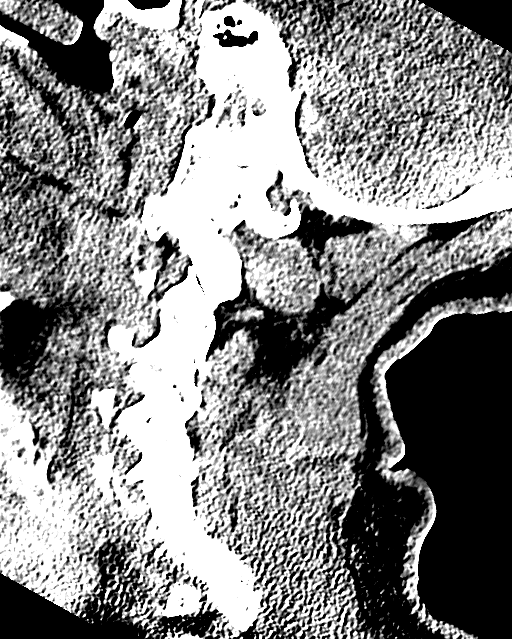
[im 29/57  brain]
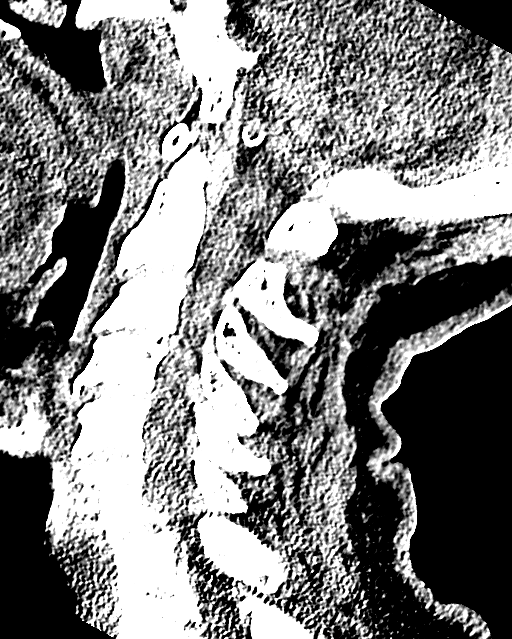
[im 38/57  brain]
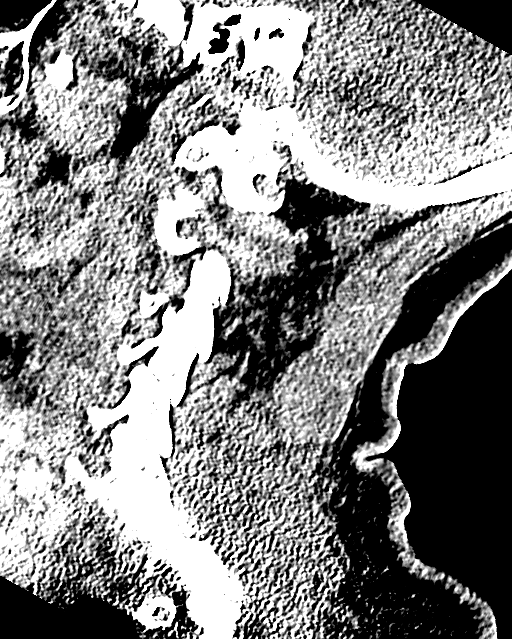

[Series 13: orthogonals · axial · 0.26mm/px · z∈[-333,-231]mm · 8 of 71 slices shown]
[im 8/71  brain]
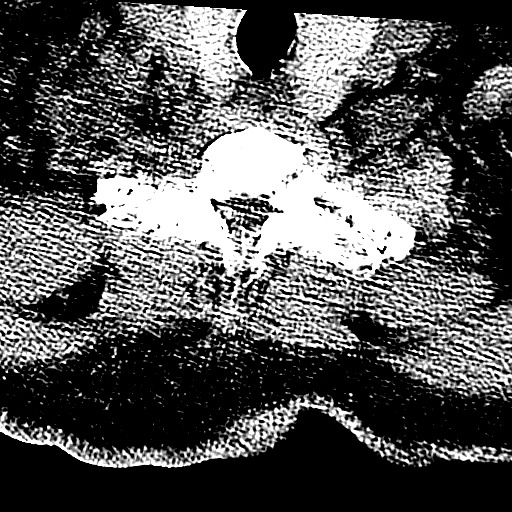
[im 16/71  brain]
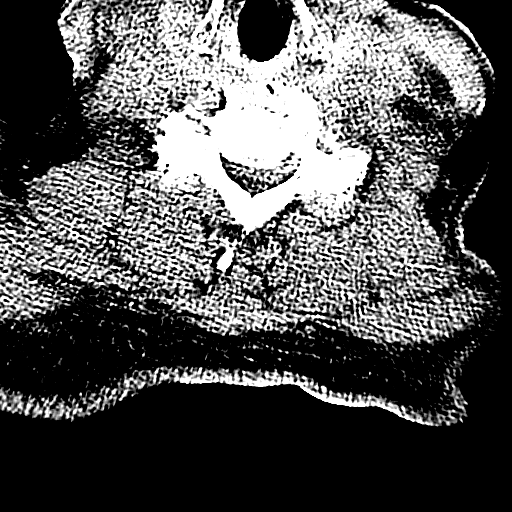
[im 24/71  brain]
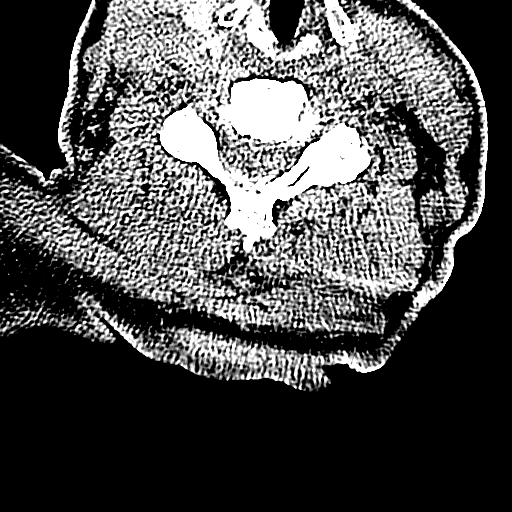
[im 32/71  brain]
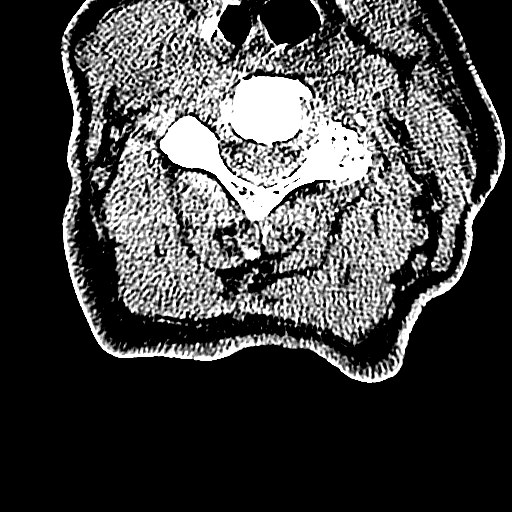
[im 39/71  brain]
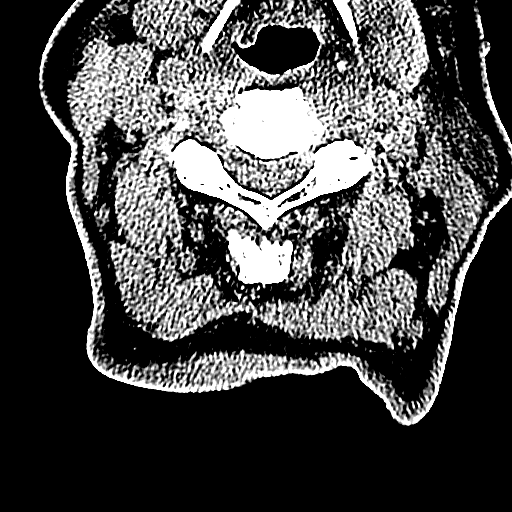
[im 47/71  brain]
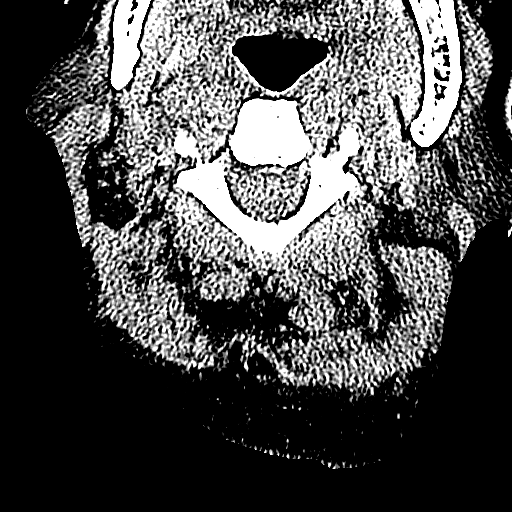
[im 55/71  brain]
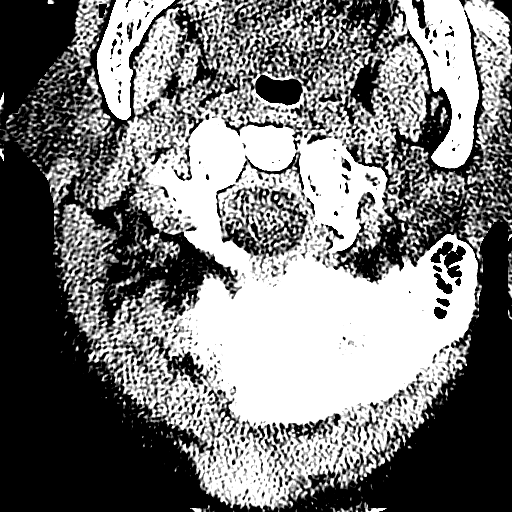
[im 63/71  brain]
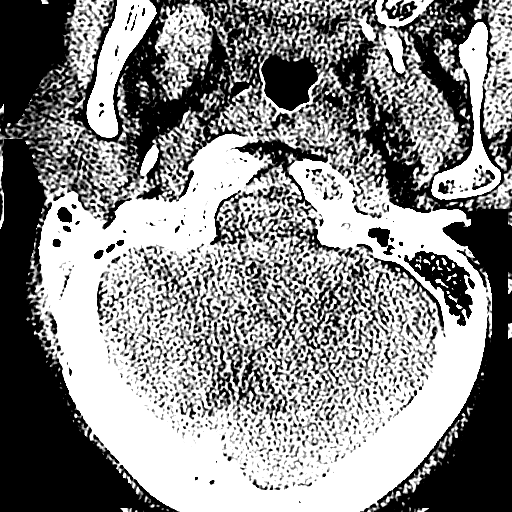

[16 of 47 positions shown; findings below may reference images not displayed]

FINDINGS: There is no evidence for acute hemorrhage, hydrocephalus,
mass lesion, or abnormal extra-axial fluid collection.  No definite
CT evidence for acute infarction.  Diffuse loss of parenchymal
volume is consistent with atrophy. Global ventricular prominence is
stable likely reflects central atrophic change.  Patchy low
attenuation in the deep hemispheric and periventricular white
matter is nonspecific, but likely reflects chronic microvascular
ischemic demyelination.  Chronic mucosal disease is seen in the
right sphenoid sinus, scattered ethmoid air cells, and the left
maxillary sinus.  No evidence for skull fracture.
IMPRESSION: No acute intracranial abnormality.

Atrophy with chronic small vessel white matter ischemic
demyelination.

Chronic paranasal sinus disease.

CT CERVICAL SPINE
FINDINGS: Imaging was obtained from the skull base through the C7-
T1 interspace.  Initial imaging had substantial motion artifact at
the C6-7 level and repeat imaging was performed through this
region.  No evidence for fracture.  No subluxation.  Loss of disc
height is seen at C3-4.  There is some mild facet osteoarthritis at
mid and lower cervical levels, left greater than right.  No
prevertebral soft tissue swelling.

8 mm nodule is identified in the posterior left thyroid lobe.
IMPRESSION: No acute cervical spine fracture.

Mid and lower cervical facet degeneration.

8 mm left thyroid nodule.  Thyroid ultrasound may prove helpful to
further evaluate.

## 2012-09-28 NOTE — Progress Notes (Signed)
PROGRESS NOTE  DATE: 09/26/2012  FACILITY:  [facility]  LEVEL OF CARE: SNF (31)  Acute Visit  CHIEF COMPLAINT:  Manage dementia  HISTORY OF PRESENT ILLNESS: I was requested by the staff to assess the patient regarding above problem(s):  DEMENTIA: The dementia remaines stable and continues to function adequately in the current living environment with supervision.  The patient has had little changes in behavior. No complications noted from the medications presently being used. Advanced. Patient is a poor historian. I was requested by the pharmacy consult and to assess the patient for possible discontinuation of Aricept due to last MMSE of 0.  PAST MEDICAL HISTORY : Reviewed.  No changes.  CURRENT MEDICATIONS: Reviewed per [facility]  PHYSICAL EXAMINATION  GENERAL: no acute distress, thin body habitus RESPIRATORY: breathing is even & unlabored, BS CTAB CARDIAC: RRR, no murmur,no extra heart sounds, no edema PSYCHIATRIC: the patient is alert & , affect & behavior appropriate  ASSESSMENT/PLAN:  Dementia-advanced. Discontinue Aricept.  CPT CODE: 40981

## 2012-10-29 ENCOUNTER — Non-Acute Institutional Stay (SKILLED_NURSING_FACILITY): Payer: Medicare Other | Admitting: Internal Medicine

## 2012-10-29 DIAGNOSIS — E1159 Type 2 diabetes mellitus with other circulatory complications: Secondary | ICD-10-CM

## 2012-10-29 DIAGNOSIS — F0280 Dementia in other diseases classified elsewhere without behavioral disturbance: Secondary | ICD-10-CM

## 2012-10-29 DIAGNOSIS — I10 Essential (primary) hypertension: Secondary | ICD-10-CM

## 2012-10-29 DIAGNOSIS — R627 Adult failure to thrive: Secondary | ICD-10-CM

## 2012-10-29 NOTE — Progress Notes (Signed)
PROGRESS NOTE  DATE: 10-29-12  FACILITY: Nursing Home Location: [facility]  LEVEL OF CARE: SNF (31)  Routine Visit  CHIEF COMPLAINT:  Manage dementia, diabetes mellitus and hypertension  HISTORY OF PRESENT ILLNESS:  REASSESSMENT OF ONGOING PROBLEM(S):  HTN: Pt 's HTN remains stable.  Staff Deny CP, sob, DOE, pedal edema, headaches, dizziness or visual disturbances.  No complications from the medications currently being used.  Last BP : 112/77, 120/68, 140/86, 122/80, 135/65.  DM:pt's DM remains stable.  Staff deny polyuria, polydipsia, polyphagia, changes in vision or hypoglycemic episodes.  No complications noted from the medication presently being used.  Last hemoglobin A1c is: 7.3 in 5/14, in 9-14 hemoglobin A1c 8.7.  DEMENTIA: The dementia remaines stable and continues to function adequately in the current living environment with supervision.  The patient has had little changes in behavior. No complications noted from the medications presently being used. Patient is a poor historian.  PAST MEDICAL HISTORY : Reviewed.  No changes.  CURRENT MEDICATIONS: Reviewed per [facility]  REVIEW OF SYSTEMS: Unobtainable secondary to dementia.  PHYSICAL EXAMINATION  VS:  T 98      P 76      RR 20      BP 135/65   POX %     WT (Lb) 105  GENERAL: no acute distress, thin body habitus NECK: supple, trachea midline, no neck masses, no thyroid tenderness, no thyromegaly RESPIRATORY: breathing is even & unlabored, BS CTAB CARDIAC: RRR, no murmur,no extra heart sounds, no edema GI: abdomen soft, normal BS, no masses, no tenderness, no hepatomegaly, no splenomegaly PSYCHIATRIC: the patient is alert & disoriented, affect & behavior appropriate  LABS/RADIOLOGY:  9-14 CBC normal, CMP normal, TSH 1.06  5/14 TSH 0.947  In 5/14 liver profile normal, hemoglobin A1c 7.3, vitamin B12 level 687, folate 13.6  4/14 CBC normal, glucose 183 otherwise BMP  normal  ASSESSMENT/PLAN:  Alzheimer's dementia-advanced.  Diabetes mellitus-Lantus was decreased due to hypoglycemia hypertension-well controlled. failure to thrive-currently on marinol. Weight is stable PVD-no acute issues. protein calorie malnutrition-continue dietary supplements. dysphagia-continue a dysphagia 1 diet with thin liquids and full aspiration precautions.  CPT CODE: 40981

## 2012-11-19 ENCOUNTER — Non-Acute Institutional Stay (SKILLED_NURSING_FACILITY): Payer: Medicare Other | Admitting: Internal Medicine

## 2012-11-19 DIAGNOSIS — I10 Essential (primary) hypertension: Secondary | ICD-10-CM

## 2012-11-19 DIAGNOSIS — R627 Adult failure to thrive: Secondary | ICD-10-CM

## 2012-11-19 DIAGNOSIS — F0280 Dementia in other diseases classified elsewhere without behavioral disturbance: Secondary | ICD-10-CM

## 2012-11-19 DIAGNOSIS — E1165 Type 2 diabetes mellitus with hyperglycemia: Secondary | ICD-10-CM

## 2012-11-24 NOTE — Progress Notes (Signed)
PROGRESS NOTE  DATE: 11-19-12  FACILITY: Nursing Home Location: [facility]  LEVEL OF CARE: SNF (31)  Routine Visit  CHIEF COMPLAINT:  Manage dementia, diabetes mellitus and hypertension  HISTORY OF PRESENT ILLNESS:  REASSESSMENT OF ONGOING PROBLEM(S):  HTN: Pt 's HTN remains stable.  Staff Deny CP, sob, DOE, pedal edema, headaches, dizziness or visual disturbances.  No complications from the medications currently being used.  Last BP : 112/77, 120/68, 140/86, 122/80, 135/65, 140/64.  DM:pt's DM remains stable.  Staff deny polyuria, polydipsia, polyphagia, changes in vision or hypoglycemic episodes.  No complications noted from the medication presently being used.  Last hemoglobin A1c is: 7.3 in 5/14, in 9-14 hemoglobin A1c 8.7.  DEMENTIA: The dementia remaines stable and continues to function adequately in the current living environment with supervision.  The patient has had little changes in behavior. No complications noted from the medications presently being used. Patient is a poor historian.  PAST MEDICAL HISTORY : Reviewed.  No changes.  CURRENT MEDICATIONS: Reviewed per [facility]  REVIEW OF SYSTEMS: Unobtainable secondary to dementia.  PHYSICAL EXAMINATION  VS:  T 96.9      P 74      RR 16      BP 140/64   POX %     WT (Lb) 108  GENERAL: no acute distress, thin body habitus NECK: supple, trachea midline, no neck masses, no thyroid tenderness, no thyromegaly RESPIRATORY: breathing is even & unlabored, BS CTAB CARDIAC: RRR, no murmur,no extra heart sounds, no edema GI: abdomen soft, normal BS, no masses, no tenderness, no hepatomegaly, no splenomegaly PSYCHIATRIC: the patient is alert & disoriented, affect & behavior appropriate  LABS/RADIOLOGY:  9-14 CBC normal, CMP normal, TSH 1.06  5/14 TSH 0.947  In 5/14 liver profile normal, hemoglobin A1c 7.3, vitamin B12 level 687, folate 13.6  4/14 CBC normal, glucose 183 otherwise BMP  normal  ASSESSMENT/PLAN:  Alzheimer's dementia-advanced.  Diabetes mellitus-Lantus was decreased due to hypoglycemia hypertension-well controlled. failure to thrive-currently on marinol. Weight is stable PVD-no acute issues. protein calorie malnutrition-continue dietary supplements. dysphagia-continue a dysphagia 1 diet with thin liquids and full aspiration precautions.  CPT CODE: 16109

## 2012-12-18 ENCOUNTER — Non-Acute Institutional Stay (SKILLED_NURSING_FACILITY): Payer: Medicare Other | Admitting: Internal Medicine

## 2012-12-18 DIAGNOSIS — I10 Essential (primary) hypertension: Secondary | ICD-10-CM

## 2012-12-18 DIAGNOSIS — F0280 Dementia in other diseases classified elsewhere without behavioral disturbance: Secondary | ICD-10-CM

## 2012-12-18 DIAGNOSIS — E1159 Type 2 diabetes mellitus with other circulatory complications: Secondary | ICD-10-CM

## 2012-12-18 DIAGNOSIS — R627 Adult failure to thrive: Secondary | ICD-10-CM

## 2012-12-18 NOTE — Progress Notes (Signed)
PROGRESS NOTE  DATE: 12-18-12  FACILITY: Nursing Home Location: [facility]  LEVEL OF CARE: SNF (31)  Routine Visit  CHIEF COMPLAINT:  Manage dementia, diabetes mellitus and hypertension  HISTORY OF PRESENT ILLNESS:  REASSESSMENT OF ONGOING PROBLEM(S):  HTN: Pt 's HTN remains stable.  Staff Deny CP, sob, DOE, pedal edema, headaches, dizziness or visual disturbances.  No complications from the medications currently being used.  Last BP : 112/77, 120/68, 140/86, 122/80, 135/65, 140/64, 140/60.  DM:pt's DM remains stable.  Staff deny polyuria, polydipsia, polyphagia, changes in vision or hypoglycemic episodes.  No complications noted from the medication presently being used.  Last hemoglobin A1c is: 7.3 in 5/14, in 9-14 hemoglobin A1c 8.7.  DEMENTIA: The dementia remaines stable and continues to function adequately in the current living environment with supervision.  The patient has had little changes in behavior. No complications noted from the medications presently being used. Patient is a poor historian.  PAST MEDICAL HISTORY : Reviewed.  No changes.  CURRENT MEDICATIONS: Reviewed per [facility]  REVIEW OF SYSTEMS: Unobtainable secondary to dementia.  PHYSICAL EXAMINATION  VS:  T 97.5      P 82      RR 18      BP 140/60   POX %     WT (Lb) 108  GENERAL: no acute distress, thin body habitus NECK: supple, trachea midline, no neck masses, no thyroid tenderness, no thyromegaly RESPIRATORY: breathing is even & unlabored, BS CTAB CARDIAC: RRR, no murmur,no extra heart sounds, no edema GI: abdomen soft, normal BS, no masses, no tenderness, no hepatomegaly, no splenomegaly PSYCHIATRIC: the patient is alert & disoriented, affect & behavior appropriate  LABS/RADIOLOGY:  9-14 CBC normal, CMP normal, TSH 1.06  5/14 TSH 0.947  In 5/14 liver profile normal, hemoglobin A1c 7.3, vitamin B12 level 687, folate 13.6  4/14 CBC normal, glucose 183 otherwise BMP  normal  ASSESSMENT/PLAN:  Alzheimer's dementia-advanced.  Diabetes mellitus-Lantus was decreased due to hypoglycemia hypertension-BP borderline. Will monitor. failure to thrive-currently on marinol. Weight increased PVD-no acute issues. protein calorie malnutrition-continue dietary supplements. dysphagia-continue a dysphagia 1 diet with thin liquids and full aspiration precautions.  CPT CODE: 16109

## 2013-01-01 ENCOUNTER — Other Ambulatory Visit: Payer: Self-pay | Admitting: *Deleted

## 2013-01-01 MED ORDER — DRONABINOL 2.5 MG PO CAPS: ORAL_CAPSULE | ORAL | Status: AC

## 2013-01-15 ENCOUNTER — Non-Acute Institutional Stay (SKILLED_NURSING_FACILITY): Payer: Medicare Other | Admitting: Internal Medicine

## 2013-01-15 DIAGNOSIS — E1159 Type 2 diabetes mellitus with other circulatory complications: Secondary | ICD-10-CM

## 2013-01-15 DIAGNOSIS — F0280 Dementia in other diseases classified elsewhere without behavioral disturbance: Secondary | ICD-10-CM

## 2013-01-15 DIAGNOSIS — I10 Essential (primary) hypertension: Secondary | ICD-10-CM

## 2013-01-15 DIAGNOSIS — R627 Adult failure to thrive: Secondary | ICD-10-CM

## 2013-01-18 ENCOUNTER — Encounter: Payer: Self-pay | Admitting: Internal Medicine

## 2013-01-18 NOTE — Progress Notes (Signed)
PROGRESS NOTE  DATE: 01-15-13  FACILITY: Nursing Home Location: [facility]  LEVEL OF CARE: SNF (31)  Routine Visit  CHIEF COMPLAINT:  Manage dementia, diabetes mellitus and hypertension  HISTORY OF PRESENT ILLNESS:  REASSESSMENT OF ONGOING PROBLEM(S):  HTN: Pt 's HTN remains stable.  Staff Deny CP, sob, DOE, pedal edema, headaches, dizziness or visual disturbances.  No complications from the medications currently being used.  Last BP : 112/77, 120/68, 140/86, 122/80, 135/65, 140/64, 140/60.  DM:pt's DM remains stable.  Staff deny polyuria, polydipsia, polyphagia, changes in vision or hypoglycemic episodes.  No complications noted from the medication presently being used.  Last hemoglobin A1c is: 7.3 in 5/14, in 9-14 hemoglobin A1c 8.7.  DEMENTIA: The dementia remaines stable and continues to function adequately in the current living environment with supervision.  The patient has had little changes in behavior. No complications noted from the medications presently being used. Patient is a poor historian.  PAST MEDICAL HISTORY : Reviewed.  No changes.  CURRENT MEDICATIONS: Reviewed per [facility]  REVIEW OF SYSTEMS: Unobtainable secondary to dementia.  PHYSICAL EXAMINATION  VS:  T 97.5      P 82      RR 18      BP 140/60   POX %     WT (Lb) 108  GENERAL: no acute distress, thin body habitus EYES: Normal sclerae, normal conjunctivae, no discharge NECK: supple, trachea midline, no neck masses, no thyroid tenderness, no thyromegaly LYMPHATICS: No cervical lymphadenopathy, no supraclavicular lymphadenopathy RESPIRATORY: breathing is even & unlabored, BS CTAB CARDIAC: RRR, no murmur,no extra heart sounds, no edema GI: abdomen soft, normal BS, no masses, no tenderness, no hepatomegaly, no splenomegaly PSYCHIATRIC: the patient is alert & disoriented, affect & behavior appropriate  LABS/RADIOLOGY:  9-14 CBC normal, CMP normal, TSH 1.06  5/14 TSH 0.947  In 5/14  liver profile normal, hemoglobin A1c 7.3, vitamin B12 level 687, folate 13.6  4/14 CBC normal, glucose 183 otherwise BMP normal  ASSESSMENT/PLAN:  Alzheimer's dementia-advanced.  Diabetes mellitus-Lantus was decreased due to hypoglycemia hypertension-uncontrolled. Increase hydralazine to 50 mg 3 times a day failure to thrive-currently on marinol. Weight stable PVD-no acute issues. protein calorie malnutrition-continue dietary supplements. dysphagia-continue a dysphagia 1 diet with thin liquids and full aspiration precautions.  CPT CODE: 19147

## 2013-01-21 NOTE — Progress Notes (Signed)
Patient ID: Kristi Snow, female   DOB: Jun 21, 1924, 79 y.o.   MRN: [id number]     [facility]  No Known Allergies  Chief Complaint  Patient presents with  . Acute Visit    follow up diabetes    HPI Her am cbg's have been low; her appetite is poor; she is taking marinol to help stimulate her appetite. Her latest weight  is 101 pounds and her last hgb a1c is 7.3. She could tolerate a more liberal treatment of her disease state at this time.   Past Medical History  Diagnosis Date  . Hypertension   . Diabetes mellitus   . Encephalopathy, unspecified   . Urinary tract infection   . Dementia   . Prolapsed uterus   . Lumbar spinal stenosis   . Crystal arthritis 07/09/2011    No past surgical history on file.  Filed Vitals:   08/02/12 2119  BP: 122/68  Pulse: 66  Height: 5\' 8"  (1.727 m)  Weight: 101 lb (45.813 kg)    MEDICATIONS  humalog ssi ac and hs lantus 8 units nightly norvasc 10 mg daily mvii daily Vit c daily marinol 2.5 mg daily pletal 100 mg twice daily Isordil 20 mg twice daily Apresoline 10 mg tid aricept 5 mg daily   LABS REVIEWED:   06-08-12: wbc 3.6; hgb 132; hct 40.3; mcv 94; plt 284; glucose 183; bun 10; creat 0.52; k+4.2;na++140 06-27-12: liver normal albumin 4.3; vit b12: 687; folate 13.6; tsh 0.947; hgb a1c 7.3    Review of Systems  Unable to perform ROS   Physical Exam  Constitutional: No distress.  frail  Neck: Neck supple. No JVD present.  Cardiovascular: Normal rate, regular rhythm and intact distal pulses.   Respiratory: Effort normal and breath sounds normal. No respiratory distress. She has no wheezes.  GI: Soft. Bowel sounds are normal. She exhibits no distension.  Musculoskeletal: She exhibits no edema.  Is able to move all extremities  Neurological: She is alert.  Skin: Skin is warm and dry. She is not diaphoretic.     ASSESSMENT/PLAN  1. Diabetes will stop the ssi; will begin humalog 3 units prior to meals for cbg >=150  and will lower her lantus to 5 units  2. Hypertension: will continue norvasc 10 mg daily  apresoline 10 mg tid and isordil 20 mg twice daily  3. pvd will continue pletal 100 mg twice daily  4. Alzheimer's disease no significant change will continue aricept 5 mg daily; if she continues to lose weight may need to consider stopping the aricept  5. Ftt: will continue marinol 2.5 mg daily and will monitor

## 2013-02-19 ENCOUNTER — Non-Acute Institutional Stay (SKILLED_NURSING_FACILITY): Payer: Medicare Other | Admitting: Internal Medicine

## 2013-02-19 DIAGNOSIS — R627 Adult failure to thrive: Secondary | ICD-10-CM

## 2013-02-19 DIAGNOSIS — F0280 Dementia in other diseases classified elsewhere without behavioral disturbance: Secondary | ICD-10-CM

## 2013-02-19 DIAGNOSIS — G309 Alzheimer's disease, unspecified: Principal | ICD-10-CM

## 2013-02-19 DIAGNOSIS — I10 Essential (primary) hypertension: Secondary | ICD-10-CM

## 2013-02-19 DIAGNOSIS — E1159 Type 2 diabetes mellitus with other circulatory complications: Secondary | ICD-10-CM

## 2013-02-20 NOTE — Progress Notes (Signed)
PROGRESS NOTE  DATE: 02-19-13  FACILITY: Nursing Home Location: [facility]  LEVEL OF CARE: SNF (31)  Routine Visit  CHIEF COMPLAINT:  Manage dementia, diabetes mellitus and hypertension  HISTORY OF PRESENT ILLNESS:  REASSESSMENT OF ONGOING PROBLEM(S):  HTN: Pt 's HTN remains stable.  Staff Deny CP, sob, DOE, pedal edema, headaches, dizziness or visual disturbances.  No complications from the medications currently being used.  Last BP : 112/77, 120/68, 140/86, 122/80, 135/65, 140/64, 140/60, 126/70.  DM:pt's DM remains stable.  Staff deny polyuria, polydipsia, polyphagia, changes in vision or hypoglycemic episodes.  No complications noted from the medication presently being used.  Last hemoglobin A1c is: 7.3 in 5/14, in 9-14 hemoglobin A1c 8.7.  DEMENTIA: The dementia remaines stable and continues to function adequately in the current living environment with supervision.  The patient has had little changes in behavior. No complications noted from the medications presently being used. Patient is a poor historian.  PAST MEDICAL HISTORY : Reviewed.  No changes.  CURRENT MEDICATIONS: Reviewed per [facility]  REVIEW OF SYSTEMS: Unobtainable secondary to dementia.  PHYSICAL EXAMINATION  VS:  T 97.8      P 70      RR 20      BP 126/70   POX %     WT (Lb) 117.9  GENERAL: no acute distress, thin body habitus NECK: supple, trachea midline, no neck masses, no thyroid tenderness, no thyromegaly RESPIRATORY: breathing is even & unlabored, BS CTAB CARDIAC: RRR, no murmur,no extra heart sounds, no edema GI: abdomen soft, normal BS, no masses, no tenderness, no hepatomegaly, no splenomegaly PSYCHIATRIC: the patient is alert & disoriented, affect & behavior appropriate  LABS/RADIOLOGY:  9-14 CBC normal, CMP normal, TSH 1.06  5/14 TSH 0.947  In 5/14 liver profile normal, hemoglobin A1c 7.3, vitamin B12 level 687, folate 13.6  4/14 CBC normal, glucose 183 otherwise BMP  normal  ASSESSMENT/PLAN:  Alzheimer's dementia-advanced.  Diabetes mellitus-Lantus was decreased due to hypoglycemia. Check hemoglobin A1c hypertension-well controlled failure to thrive-currently on marinol. Weight increased PVD-no acute issues. protein calorie malnutrition-continue dietary supplements. dysphagia-continue a dysphagia 1 diet with thin liquids and full aspiration precautions.  CPT CODE: 69450

## 2013-02-26 ENCOUNTER — Non-Acute Institutional Stay (SKILLED_NURSING_FACILITY): Payer: Medicare Other | Admitting: Internal Medicine

## 2013-02-26 DIAGNOSIS — E1159 Type 2 diabetes mellitus with other circulatory complications: Secondary | ICD-10-CM

## 2013-02-26 NOTE — Progress Notes (Signed)
         PROGRESS NOTE  DATE: 02/26/2013  FACILITY:  [facility]  LEVEL OF CARE: SNF (31)  Acute Visit  CHIEF COMPLAINT:  Manage diabetes mellitus  HISTORY OF PRESENT ILLNESS: I was requested by the staff to assess the patient regarding above problem(s):  DM:pt's DM remains stable.  Staff deny polyuria, polydipsia, polyphagia, changes in vision or hypoglycemic episodes.  No complications noted from the medication presently being used.  Last hemoglobin A1c is: 8.1 on 02-20-13. Patient is a poor historian due to dementia.  PAST MEDICAL HISTORY : Reviewed.  No changes.  CURRENT MEDICATIONS: Reviewed per [facility]  REVIEW OF SYSTEMS: Unobtainable due to dementia  PHYSICAL EXAMINATION  GENERAL: no acute distress, thin body habitus RESPIRATORY: breathing is even & unlabored, BS CTAB CARDIAC: RRR, no murmur,no extra heart sounds, no edema GI: abdomen soft, normal BS, no masses, no tenderness, no hepatomegaly, no splenomegaly PSYCHIATRIC: the patient is alert & disoriented, affect & behavior appropriate  LABS/RADIOLOGY: See history of present illness  ASSESSMENT/PLAN:  Diabetes mellitus-uncontrolled. Increase Lantus to 8 units each bedtime  CPT CODE: 26333

## 2013-05-28 ENCOUNTER — Non-Acute Institutional Stay (SKILLED_NURSING_FACILITY): Payer: Medicare Other | Admitting: Adult Health

## 2013-05-28 DIAGNOSIS — I739 Peripheral vascular disease, unspecified: Secondary | ICD-10-CM

## 2013-05-28 DIAGNOSIS — G309 Alzheimer's disease, unspecified: Secondary | ICD-10-CM

## 2013-05-28 DIAGNOSIS — F0280 Dementia in other diseases classified elsewhere without behavioral disturbance: Secondary | ICD-10-CM

## 2013-05-28 DIAGNOSIS — E1159 Type 2 diabetes mellitus with other circulatory complications: Secondary | ICD-10-CM

## 2013-05-28 DIAGNOSIS — I10 Essential (primary) hypertension: Secondary | ICD-10-CM

## 2013-05-28 DIAGNOSIS — R627 Adult failure to thrive: Secondary | ICD-10-CM

## 2013-06-10 ENCOUNTER — Encounter: Payer: Self-pay | Admitting: Adult Health

## 2013-06-10 NOTE — Progress Notes (Signed)
Patient ID: Kristi Snow, female   DOB: [birth year], 80 y.o.   MRN: [id number]    [facility]   No Known Allergies   Chief Complaint  Patient presents with  . Medical Management of Chronic Issues    HPI:  She is beingseen for the management of her chronic illnesses. Overall her status remains without significant change. She has been on marinol daily for an extended period of time without weight change for the past several months. There are no concerns being voiced by the nursing staff at this time.  She is unable to fully participate in the hpi or ros.   Past Medical History  Diagnosis Date  . Hypertension   . Diabetes mellitus   . Encephalopathy, unspecified   . Urinary tract infection   . Dementia   . Prolapsed uterus   . Lumbar spinal stenosis   . Crystal arthritis 07/09/2011    No past surgical history on file.  VITAL SIGNS BP 106/68  Pulse 78  Ht 5\' 8"  (1.727 m)  Wt 119 lb (53.978 kg)  BMI 18.10 kg/m2   Patient's Medications  New Prescriptions   No medications on file  Previous Medications   AMLODIPINE (NORVASC) 10 MG TABLET    Take 1 tablet (10 mg total) by mouth daily.   CILOSTAZOL (PLETAL) 100 MG TABLET    Take 100 mg by mouth 2 (two) times daily.     DIPHENHYDRAMINE (SOMINEX) 25 MG TABLET    Take 25 mg by mouth 2 (two) times daily.   DRONABINOL (MARINOL) 2.5 MG CAPSULE    Take 1 capsule before lunch  daily   FEEDING SUPPLEMENT (ENSURE COMPLETE) LIQD    Take 237 mLs by mouth 2 (two) times daily between meals.   INSULIN GLARGINE (LANTUS) 100 UNIT/ML INJECTION    Inject 8 Units into the skin at bedtime.   ISOSORBIDE MONONITRATE (ISMO,MONOKET) 20 MG TABLET    Take 20 mg by mouth 2 (two) times daily.    MULTIPLE VITAMIN (MULTIVITAMIN) TABLET    Take 1 tablet by mouth daily.   VITAMIN C (ASCORBIC ACID) 500 MG TABLET    Take 500 mg by mouth daily.  Modified Medications   Modified Medication Previous Medication   HYDRALAZINE (APRESOLINE) 10 MG TABLET hydrALAZINE  (APRESOLINE) 10 MG tablet      Take 10 mg by mouth 3 (three) times daily.    Take 1 tablet (10 mg total) by mouth 3 (three) times daily.   INSULIN ASPART (NOVOLOG) 100 UNIT/ML INJECTION insulin aspart (NOVOLOG) 100 UNIT/ML injection      Inject 3 Units into the skin 3 (three) times daily with meals. 3 units prior to meals for cbg >=150    Inject 0-10 Units into the skin 4 (four) times daily - after meals and at bedtime. CBG 70-120: 0 u; CBG 121-150: 3 u; CBG 151-200: 4 u; CBG 201-250: 7 u; CBG 251-300: 11 u;CBG 301-350: 15 u; CBG 351-400: 20 u; CBG > 400: call MD  Discontinued Medications   DONEPEZIL (ARICEPT) 5 MG TABLET    Take 5 mg by mouth daily.     SIGNIFICANT DIAGNOSTIC EXAMS    LABS REVIEWED:   10-18-13: wbc 4.9; hgb 14.0; hct 41.5; mcv 89; plt 306; glucose 69; bun 15; creat 0.44; k+4.1; na++141; liver normal albumin 3.9; tsh 1.060; hgb a1c 8.7 02-21-13: hgb a1c 8.1       Review of Systems  Unable to perform ROS   Physical Exam  Constitutional:  No distress.  thin  Neck: Neck supple. No JVD present.  Cardiovascular: Normal rate, regular rhythm and intact distal pulses.   Respiratory: Breath sounds normal. No respiratory distress. She has no wheezes.  GI: Soft. Bowel sounds are normal. She exhibits no distension. There is no tenderness.  Musculoskeletal: She exhibits no edema.  Neurological: She is alert.  Skin: Skin is warm and dry. She is not diaphoretic.     ASSESSMENT/ PLAN:  1. Alzheimer's disease: no change in status; is not on medications at this time; will not make changes will monitor her status   2. Hypertension: is stable; will continue norvasc 10 mg daily and apresoline 15 mg three times daily and isordil 20 mg twice daily   3. PVD; will continue pletal 100 mg twice daily  4. Adult ftt: her weight remains without change over the past several months; will stop the marinol at this time and will continue to monitor her status.   5. Diabetes: will continue  humalog 3 units prior to meals for cbg >=150 and lantus 8 units nightly   Will check cbc and cmp

## 2013-06-18 ENCOUNTER — Non-Acute Institutional Stay (SKILLED_NURSING_FACILITY): Payer: Medicare Other | Admitting: Internal Medicine

## 2013-06-18 DIAGNOSIS — N63 Unspecified lump in unspecified breast: Secondary | ICD-10-CM

## 2013-06-18 NOTE — Progress Notes (Signed)
Patient ID: Kristi Snow, female   DOB: [birth year], 80 y.o.   MRN: [id number] [facility]; [facility] Chief complaint asked to see by nursing staff with regards to a large left breast mass History; was asked to look at this woman's left breast by the staff today apparently a mass is noted. There is some suggestion from the staff that this has been there for a long period of time and may actually have been noted in a previous nursing home that she was at the. It would appear that she was admitted here in April 2014. I have looked through [facility] link I don't see anything referenced seeing a breast mass. She has advanced Alzheimer's disease with failure to thrive. She is being cared for by our service of the last year.   physical examination Gen. very frail elderly woman who appears to have advanced to preterminal dementia. She seems blissfully unaware of my examination Breasts; there is a very large mass that consumes most of the left breast . This is firm and nontender. There is a firm lymph node deep within the superior aspect of the left axilla. In the right breast there is also a mass at roughly 10:00. There is no nodes in the right axilla. In the middle of her breasts there is a very large coledome which is an incidental finding.   Impression/plan #1 advanced left breast mass which is probably malignant. There is also a mass in the right breast. There is some suggestion from the staff that this has been there chronically but I don't see any reference to this anywhere. It would be difficult to see putting this frail elderly woman with advanced dementia through a breast biopsy. I am doubtful that anything that could be done meaningfully. Our lab is staff speak to the family to see what they know about this and proceed based on what they wish to do. She has no advanced

## 2013-06-24 ENCOUNTER — Non-Acute Institutional Stay (SKILLED_NURSING_FACILITY): Payer: Medicare Other | Admitting: Internal Medicine

## 2013-06-24 DIAGNOSIS — R627 Adult failure to thrive: Secondary | ICD-10-CM

## 2013-06-25 NOTE — Progress Notes (Signed)
Patient ID: Kristi Snow, female   DOB: [birth year], 80 y.o.   MRN: [id number]            PROGRESS NOTE  DATE: 06/24/2013       FACILITY:  [facility]  LEVEL OF CARE: SNF (31)  Acute Visit  CHIEF COMPLAINT:  Manage failure to thrive.    HISTORY OF PRESENT ILLNESS: I was requested by the staff to assess the patient regarding above problem(s):  Per staff, patient is not eating and losing weight.  She is currently on Marinol which is not helping.  Patient does not follow commands.  She has advanced dementia.    PAST MEDICAL HISTORY : Reviewed.  No changes/see problem list  CURRENT MEDICATIONS: Reviewed per MAR/see medication list  REVIEW OF SYSTEMS:  Unobtainable due to dementia.    PHYSICAL EXAMINATION  VS:  T 98.2       P 108      RR 16      BP 101/73      WT (Lb) 119       GENERAL: no acute distress, thin body habitus NECK: supple, trachea midline, no neck masses, no thyroid tenderness, no thyromegaly RESPIRATORY: breathing is even & unlabored, BS CTAB CARDIAC: RRR, no murmur,no extra heart sounds, no edema GI: abdomen soft, normal BS, no masses, no tenderness, no hepatomegaly, no splenomegaly PSYCHIATRIC: the patient does not follow commands, decreased affect and mood      LABS/RADIOLOGY: 05/2013:  CMP normal.    10/2012:  TSH 1.06.    ASSESSMENT/PLAN:  Failure to thrive.  Unstable problem.  Patient is not eating and is losing weight on Marinol.  She has advanced dementia.   Therefore, at this point recommend comfort measures.  Called and left a message with patient's son to discuss comfort care measures.      CPT CODE: 11657       Pravin Perezperez Y Ples [place], [place] [phone]

## 2013-06-28 ENCOUNTER — Other Ambulatory Visit: Payer: Self-pay | Admitting: *Deleted

## 2013-06-28 MED ORDER — OXYCODONE-ACETAMINOPHEN 5-325 MG PO TABS: ORAL_TABLET | ORAL | Status: DC

## 2013-06-28 MED ORDER — OXYCODONE-ACETAMINOPHEN 5-325 MG PO TABS: ORAL_TABLET | ORAL | Status: AC

## 2013-06-28 NOTE — Telephone Encounter (Signed)
[facility] 

## 2013-07-15 DEATH — deceased
